# Patient Record
Sex: Male | Born: 1955 | Race: White | Hispanic: No | Marital: Married | Smoking: Never smoker
Health system: Southern US, Community
[De-identification: ages and names within clinical notes are randomized; demographics above are authoritative.]

## PROBLEM LIST (undated history)

## (undated) DIAGNOSIS — I10 Essential (primary) hypertension: Secondary | ICD-10-CM

## (undated) DIAGNOSIS — M1990 Unspecified osteoarthritis, unspecified site: Secondary | ICD-10-CM

## (undated) DIAGNOSIS — E119 Type 2 diabetes mellitus without complications: Secondary | ICD-10-CM

## (undated) DIAGNOSIS — R0600 Dyspnea, unspecified: Secondary | ICD-10-CM

## (undated) DIAGNOSIS — M542 Cervicalgia: Secondary | ICD-10-CM

## (undated) DIAGNOSIS — M549 Dorsalgia, unspecified: Secondary | ICD-10-CM

## (undated) DIAGNOSIS — M109 Gout, unspecified: Secondary | ICD-10-CM

## (undated) DIAGNOSIS — E7800 Pure hypercholesterolemia, unspecified: Secondary | ICD-10-CM

## (undated) DIAGNOSIS — M2550 Pain in unspecified joint: Secondary | ICD-10-CM

## (undated) DIAGNOSIS — R600 Localized edema: Secondary | ICD-10-CM

## (undated) DIAGNOSIS — M25559 Pain in unspecified hip: Secondary | ICD-10-CM

## (undated) DIAGNOSIS — K5900 Constipation, unspecified: Secondary | ICD-10-CM

## (undated) DIAGNOSIS — M25519 Pain in unspecified shoulder: Secondary | ICD-10-CM

## (undated) DIAGNOSIS — G4730 Sleep apnea, unspecified: Secondary | ICD-10-CM

## (undated) DIAGNOSIS — Z87442 Personal history of urinary calculi: Secondary | ICD-10-CM

## (undated) DIAGNOSIS — E559 Vitamin D deficiency, unspecified: Secondary | ICD-10-CM

## (undated) HISTORY — DX: Pain in unspecified joint: M25.50

## (undated) HISTORY — DX: Pure hypercholesterolemia, unspecified: E78.00

## (undated) HISTORY — DX: Pain in unspecified shoulder: M25.519

## (undated) HISTORY — DX: Constipation, unspecified: K59.00

## (undated) HISTORY — DX: Cervicalgia: M54.2

## (undated) HISTORY — DX: Dorsalgia, unspecified: M54.9

## (undated) HISTORY — DX: Essential (primary) hypertension: I10

## (undated) HISTORY — DX: Sleep apnea, unspecified: G47.30

## (undated) HISTORY — PX: BACK SURGERY: SHX140

## (undated) HISTORY — DX: Pain in unspecified hip: M25.559

## (undated) HISTORY — DX: Dyspnea, unspecified: R06.00

## (undated) HISTORY — DX: Localized edema: R60.0

## (undated) HISTORY — DX: Vitamin D deficiency, unspecified: E55.9

---
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
## 2009-08-13 ENCOUNTER — Ambulatory Visit: Payer: Self-pay | Admitting: Internal Medicine

## 2012-03-19 ENCOUNTER — Emergency Department (HOSPITAL_COMMUNITY)
Admission: EM | Admit: 2012-03-19 | Discharge: 2012-03-19 | Disposition: A | Discharge status: Home / Self Care | Payer: BC Managed Care – PPO | Attending: Emergency Medicine | Admitting: Emergency Medicine

## 2012-03-19 ENCOUNTER — Encounter (HOSPITAL_COMMUNITY): Payer: Self-pay

## 2012-03-19 ENCOUNTER — Emergency Department (HOSPITAL_COMMUNITY): Payer: BC Managed Care – PPO

## 2012-03-19 DIAGNOSIS — N201 Calculus of ureter: Secondary | ICD-10-CM | POA: Insufficient documentation

## 2012-03-19 DIAGNOSIS — N200 Calculus of kidney: Secondary | ICD-10-CM

## 2012-03-19 DIAGNOSIS — R109 Unspecified abdominal pain: Secondary | ICD-10-CM | POA: Insufficient documentation

## 2012-03-19 HISTORY — DX: Gout, unspecified: M10.9

## 2012-03-19 LAB — URINALYSIS, ROUTINE W REFLEX MICROSCOPIC
Bilirubin Urine: NEGATIVE
Glucose, UA: NEGATIVE mg/dL
Protein, ur: 30 mg/dL — AB

## 2012-03-19 LAB — URINE MICROSCOPIC-ADD ON

## 2012-03-19 MED ORDER — KETOROLAC TROMETHAMINE 30 MG/ML IJ SOLN
30.0000 mg | Freq: Once | INTRAMUSCULAR | Status: AC
  Administered 2012-03-19: 30 mg via INTRAVENOUS
  Filled 2012-03-19: qty 1

## 2012-03-19 MED ORDER — HYDROMORPHONE HCL PF 1 MG/ML IJ SOLN
1.0000 mg | Freq: Once | INTRAMUSCULAR | Status: DC
  Filled 2012-03-19: qty 1

## 2012-03-19 MED ORDER — TAMSULOSIN HCL 0.4 MG PO CAPS: 0.4000 mg | ORAL_CAPSULE | Freq: Every day | ORAL | Status: DC

## 2012-03-19 MED ORDER — OXYCODONE-ACETAMINOPHEN 5-325 MG PO TABS
1.0000 | ORAL_TABLET | Freq: Four times a day (QID) | ORAL | Status: AC | PRN
Start: 2012-03-19 — End: 2012-03-29

## 2012-03-19 MED ORDER — OXYCODONE-ACETAMINOPHEN 5-325 MG PO TABS: 1.0000 | ORAL_TABLET | Freq: Four times a day (QID) | ORAL | Status: DC | PRN

## 2012-03-19 MED ORDER — ONDANSETRON HCL 4 MG/2ML IJ SOLN
4.0000 mg | Freq: Once | INTRAMUSCULAR | Status: AC
  Administered 2012-03-19: 4 mg via INTRAVENOUS
  Filled 2012-03-19: qty 2

## 2012-03-19 NOTE — ED Provider Notes (Signed)
History     CSN: [id number]  Arrival date & time 03/19/12  1847   First MD Initiated Contact with Patient 03/19/12 2036      Chief Complaint  Patient presents with  . Flank Pain    (Consider location/radiation/quality/duration/timing/severity/associated sxs/prior treatment) Patient is a 56 y.o. male presenting with flank pain. The history is provided by the patient.  Flank Pain This is a new problem. Episode onset: this morning. The problem occurs constantly. The problem has been rapidly worsening. Associated symptoms include abdominal pain. The symptoms are aggravated by nothing. The symptoms are relieved by nothing. He has tried nothing for the symptoms.    Past Medical History  Diagnosis Date  . Gout     No past surgical history on file.  No family history on file.  History  Substance Use Topics  . Smoking status: Never Smoker   . Smokeless tobacco: Not on file  . Alcohol Use: No      Review of Systems  Gastrointestinal: Positive for abdominal pain.  Genitourinary: Positive for flank pain.  All other systems reviewed and are negative.    Allergies  Review of patient's allergies indicates no known allergies.  Home Medications   Current Outpatient Rx  Name Route Sig Dispense Refill  . ALLOPURINOL 300 MG PO TABS Oral Take 300 mg by mouth daily.    [name] CIPROFLOXACIN HCL 250 MG PO TABS Oral Take 250 mg by mouth 2 (two) times daily.    [name] DICLOFENAC POTASSIUM 50 MG PO TABS Oral Take 50 mg by mouth 2 (two) times daily as needed. For pain    . ADULT MULTIVITAMIN W/MINERALS CH Oral Take 1 tablet by mouth daily.    [name] PRAVASTATIN SODIUM 40 MG PO TABS Oral Take 40 mg by mouth daily.    . TRAMADOL HCL 50 MG PO TABS Oral Take 50-100 mg by mouth daily as needed. For pain      BP 143/82  Pulse 75  Temp 98.8 F (37.1 C)  Resp 18  SpO2 98%  Physical Exam  Nursing note and vitals reviewed. Constitutional: He is oriented to person, place, and time. He appears  well-developed and well-nourished. No distress.  HENT:  Head: Normocephalic and atraumatic.  Neck: Normal range of motion. Neck supple.  Cardiovascular: Normal rate and regular rhythm.   Pulmonary/Chest: Effort normal and breath sounds normal. No respiratory distress. He has no wheezes.  Abdominal: Soft. Bowel sounds are normal. He exhibits no distension. There is no tenderness.  Musculoskeletal: Normal range of motion. He exhibits no edema.  Neurological: He is alert and oriented to person, place, and time.  Skin: Skin is warm and dry. He is not diaphoretic.    ED Course  Procedures (including critical care time)  Labs Reviewed  URINALYSIS, ROUTINE W REFLEX MICROSCOPIC - Abnormal; Notable for the following:    APPearance CLOUDY (*)    Hgb urine dipstick LARGE (*)    Protein, ur 30 (*)    Leukocytes, UA MODERATE (*)    All other components within normal limits  URINE MICROSCOPIC-ADD ON   Ct Abdomen Pelvis Wo Contrast  03/19/2012  *RADIOLOGY REPORT*  Clinical Data: Left flank pain and hematuria.  CT ABDOMEN AND PELVIS WITHOUT CONTRAST  Technique:  Multidetector CT imaging of the abdomen and pelvis was performed following the standard protocol without intravenous contrast.  Comparison: None.  Findings: 9 mm calculus in the left ureteropelvic junction with mild dilatation of the left renal collecting system, mild  enlargement of the left kidney and mild left perinephric soft tissue stranding.  There is also a 3 mm calculus in the lower pole of the left kidney.  A 12 mm calculus is demonstrated in the lower pole of the right kidney with two adjacent smaller calculi.  Normal appearing appendix.  No gastrointestinal abnormalities or enlarged lymph nodes.  Normal sized prostate gland with a small amount of calcification.  No bladder or ureteral calculi.  Unremarkable noncontrasted appearance of the liver, spleen, pancreas, gallbladder and adrenal glands.  Minimal atelectasis or scarring at both lung  bases.  Lumbar and lower thoracic spine degenerative changes, including changes of DISH.  IMPRESSION:  1.  9 mm left UPJ calculus causing mild left hydronephrosis. 2.  Bilateral nonobstructing renal calculi.  Original Report Authenticated By: Darrol Angel, M.D.     No diagnosis found.    MDM  The patient presents with the sudden onset of flank pain and the ct shows a 9 mm stone in the upj.  He feels much better with dilaudid and toradol.  He will discharged with percocet and flomax.        Geoffery Lyons, MD 03/19/12 2308

## 2012-03-19 NOTE — ED Notes (Signed)
Complains of blood in urine x 1 week ago, seen md for same today started habing left flank pain.

## 2012-03-19 NOTE — Discharge Instructions (Signed)

## 2012-03-28 HISTORY — PX: KIDNEY STONE SURGERY: SHX686

## 2013-02-08 ENCOUNTER — Encounter: Payer: Self-pay | Admitting: Diagnostic Neuroimaging

## 2013-02-08 ENCOUNTER — Ambulatory Visit (INDEPENDENT_AMBULATORY_CARE_PROVIDER_SITE_OTHER): Payer: BC Managed Care – PPO | Admitting: Diagnostic Neuroimaging

## 2013-02-08 VITALS — BP 156/98 | HR 84 | Ht 74.0 in | Wt 367.0 lb

## 2013-02-08 DIAGNOSIS — R259 Unspecified abnormal involuntary movements: Secondary | ICD-10-CM

## 2013-02-08 DIAGNOSIS — R5383 Other fatigue: Secondary | ICD-10-CM

## 2013-02-08 DIAGNOSIS — R5381 Other malaise: Secondary | ICD-10-CM

## 2013-02-08 DIAGNOSIS — R531 Weakness: Secondary | ICD-10-CM

## 2013-02-08 DIAGNOSIS — R200 Anesthesia of skin: Secondary | ICD-10-CM

## 2013-02-08 DIAGNOSIS — R253 Fasciculation: Secondary | ICD-10-CM

## 2013-02-08 DIAGNOSIS — R209 Unspecified disturbances of skin sensation: Secondary | ICD-10-CM

## 2013-02-08 NOTE — Progress Notes (Signed)
[facility]  PATIENT: Luke Larsen DOB: 10-09-1956  REFERRING CLINICIAN: Tisovec, Richard HISTORY FROM: patient REASON FOR VISIT: Neurologic Consult   HISTORICAL  CHIEF COMPLAINT:  Chief Complaint  Patient presents with  . Fatigue    HISTORY OF PRESENT ILLNESS: Luke Larsen is a pleasant, obese gentleman who reports increased lethargy and fatigue since January 21, 2012 when daughter that lives with them had baby.  Since that time reports only approximately 3 days of feeling "good".  Reports intermittent muscle twitching all over and tingling in left arm.  Patient is left-handed.  Reports more frequent headaches. Reports increased anxiety over symptoms and disruption of regular sleep pattern.  Requested referral to PCP to investigate symptoms.  Patient has been on pravastatin in the past, stopped in November 2013. This was restarted 01/16/2013. Within a few days onset of symptoms occurred. However he has stopped pravastatin since that time but his symptoms have persisted.  Patient reports remote history of sleep apnea diagnosed by sleep study. However he did not followup for further testing or treatment.  REVIEW OF SYSTEMS: Full 14 system review of systems performed and notable only for weight loss fatigue and feeling hot and cold numbness weakness tremor decreased energy change in appetite disinterest in activities.  ALLERGIES: No Known Allergies  HOME MEDICATIONS: Outpatient Prescriptions Prior to Visit  Medication Sig Dispense Refill  . allopurinol (ZYLOPRIM) 300 MG tablet Take 300 mg by mouth daily.      . ciprofloxacin (CIPRO) 250 MG tablet Take 250 mg by mouth 2 (two) times daily.      . diclofenac (CATAFLAM) 50 MG tablet Take 50 mg by mouth 2 (two) times daily as needed. For pain      . Multiple Vitamin (MULITIVITAMIN WITH MINERALS) TABS Take 1 tablet by mouth daily.      . pravastatin (PRAVACHOL) 40 MG tablet Take 40 mg by mouth daily.      . Tamsulosin  HCl (FLOMAX) 0.4 MG CAPS Take 1 capsule (0.4 mg total) by mouth daily.  10 capsule  0  . traMADol (ULTRAM) 50 MG tablet Take 50-100 mg by mouth daily as needed. For pain       No facility-administered medications prior to visit.    PAST MEDICAL HISTORY: Past Medical History  Diagnosis Date  . Gout   . Hypercholesterolemia     PAST SURGICAL HISTORY: Past Surgical History  Procedure Laterality Date  . Kidney stone surgery  03/28/2012    FAMILY HISTORY: History reviewed. No pertinent family history.  SOCIAL HISTORY:  History   Social History  . Marital Status: Married    Spouse Name: N/A    Number of Children: 2  . 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 Smoking status: Never Smoker   . Smokeless tobacco: Not on file  . Alcohol Use: No     Comment: Quit drinking alcohol 23yrs ago  . Drug Use: No  . Sexually Active: Not on file   Other Topics Concern  . Not on file   Social History Narrative   Pt lives at home with his spouse and daughter.   Caffeine Use- Quit 01/04/13     PHYSICAL EXAM  Filed Vitals:   02/08/13 0855  BP: 156/98  Pulse: 84  Height: 6\' 2"  (1.88 m)  Weight: 367 lb (166.47 kg)   Body mass index is 47.1 kg/(m^2).  GENERAL EXAM: Patient is in  no distress  CARDIOVASCULAR: Regular rate and rhythm, no murmurs, no carotid bruits  NEUROLOGIC: MENTAL STATUS: awake, alert, language fluent, comprehension intact, naming intact CRANIAL NERVE: no papilledema on fundoscopic exam, pupils equal and reactive to light, visual fields full to confrontation, extraocular muscles intact, no nystagmus, facial sensation and strength symmetric, uvula midline, shoulder shrug symmetric, tongue midline with no fasciculations present. MOTOR: normal bulk and tone, full strength in the BUE, BLE; NO FASCICULATIONS. SENSORY: normal and symmetric to light touch, pinprick, temperature, vibration and  proprioception COORDINATION: finger-nose-finger, fine finger movements normal REFLEXES: deep tendon reflexes present; DECR RIGHT KNEE REFLEX. MUTE TOES. GAIT/STATION: narrow based gait; able to walk on toes, heels and tandem; romberg is negative   DIAGNOSTIC DATA (LABS, IMAGING, TESTING) - I reviewed patient records, labs, notes, testing and imaging myself where available.  Hep A Ab, IgM, Hep A Ab Total and Ehrlichia Ab Panel all negative.  CK Total 124 CMP,  WNL except ALT elevated at 67. WBC slightly elevated; 11.1 Blood counts elevated at Hgb 18.6, Hct 56.2, MCV 97.4, MCH 32.2    ASSESSMENT AND PLAN  57 y.o. year old male  has a past medical history of Gout and Hypercholesterolemia. here with increased fatigue, lethargy and headaches, possible sleep apnea.  DDX: CNS inflamm, vascular, metabolic, neuromuscular, sleep apnea  Will check MR Brain Wo Contrast, if negative EMG studies and sleep apnea workup. Check CK, Vitamin B12 and HgbA1C.   Orders Placed This Encounter  Procedures  . MR Brain Wo Contrast  . CK  . Vitamin B12  . Hemoglobin A1c     LYNN LAM NP-C 02/08/2013, 10:01 AM   I reviewed note, examined patient and agree with plan. Additional testing.  Suanne Marker, MD 02/08/2013, 5:07 PM Certified in Neurology, Neurophysiology and Neuroimaging  [facility] [address], [address] [place], [place] [address] [phone]

## 2013-02-08 NOTE — Patient Instructions (Signed)
Will order addl testing.

## 2013-02-09 LAB — HEMOGLOBIN A1C: Est. average glucose Bld gHb Est-mCnc: 126 mg/dL

## 2013-02-09 LAB — CK: Total CK: 104 U/L (ref 24–204)

## 2013-02-09 LAB — VITAMIN B12: Vitamin B-12: 525 pg/mL (ref 211–946)

## 2013-02-11 ENCOUNTER — Ambulatory Visit
Admission: RE | Admit: 2013-02-11 | Discharge: 2013-02-11 | Disposition: A | Discharge status: Home / Self Care | Payer: BC Managed Care – PPO | Source: Ambulatory Visit | Attending: Diagnostic Neuroimaging | Admitting: Diagnostic Neuroimaging

## 2013-02-11 DIAGNOSIS — R253 Fasciculation: Secondary | ICD-10-CM

## 2013-02-11 DIAGNOSIS — R531 Weakness: Secondary | ICD-10-CM

## 2013-02-11 DIAGNOSIS — R200 Anesthesia of skin: Secondary | ICD-10-CM

## 2013-02-11 DIAGNOSIS — R51 Headache: Secondary | ICD-10-CM

## 2013-02-13 ENCOUNTER — Other Ambulatory Visit: Payer: BC Managed Care – PPO

## 2013-02-17 ENCOUNTER — Telehealth: Payer: Self-pay | Admitting: *Deleted

## 2013-02-17 DIAGNOSIS — R531 Weakness: Secondary | ICD-10-CM

## 2013-02-17 DIAGNOSIS — R5381 Other malaise: Secondary | ICD-10-CM

## 2013-02-17 NOTE — Telephone Encounter (Signed)
Patient calling because he had MRI on Saturday and want his results. Patient been calling all week and no one has called him back.

## 2013-02-17 NOTE — Telephone Encounter (Signed)
Called pt, gave normal MRI results. Would like to go forward with additional testing (EMG).

## 2013-02-17 NOTE — Telephone Encounter (Signed)
I ordered EMG. -VRP

## 2013-02-18 ENCOUNTER — Emergency Department (HOSPITAL_COMMUNITY)
Admission: EM | Admit: 2013-02-18 | Discharge: 2013-02-18 | Disposition: A | Discharge status: Home / Self Care | Payer: BC Managed Care – PPO | Attending: Emergency Medicine | Admitting: Emergency Medicine

## 2013-02-18 ENCOUNTER — Encounter (HOSPITAL_COMMUNITY): Payer: Self-pay | Admitting: *Deleted

## 2013-02-18 DIAGNOSIS — Z8639 Personal history of other endocrine, nutritional and metabolic disease: Secondary | ICD-10-CM | POA: Insufficient documentation

## 2013-02-18 DIAGNOSIS — R251 Tremor, unspecified: Secondary | ICD-10-CM

## 2013-02-18 DIAGNOSIS — E669 Obesity, unspecified: Secondary | ICD-10-CM | POA: Insufficient documentation

## 2013-02-18 DIAGNOSIS — E7800 Pure hypercholesterolemia, unspecified: Secondary | ICD-10-CM | POA: Insufficient documentation

## 2013-02-18 DIAGNOSIS — Z862 Personal history of diseases of the blood and blood-forming organs and certain disorders involving the immune mechanism: Secondary | ICD-10-CM | POA: Insufficient documentation

## 2013-02-18 DIAGNOSIS — Z79899 Other long term (current) drug therapy: Secondary | ICD-10-CM | POA: Insufficient documentation

## 2013-02-18 DIAGNOSIS — R5381 Other malaise: Secondary | ICD-10-CM | POA: Insufficient documentation

## 2013-02-18 DIAGNOSIS — R5383 Other fatigue: Secondary | ICD-10-CM | POA: Insufficient documentation

## 2013-02-18 DIAGNOSIS — R531 Weakness: Secondary | ICD-10-CM

## 2013-02-18 LAB — URINALYSIS, ROUTINE W REFLEX MICROSCOPIC
Glucose, UA: NEGATIVE mg/dL
Specific Gravity, Urine: 1.014 (ref 1.005–1.030)
pH: 6.5 (ref 5.0–8.0)

## 2013-02-18 LAB — BASIC METABOLIC PANEL
CO2: 24 mEq/L (ref 19–32)
Chloride: 102 mEq/L (ref 96–112)
Sodium: 139 mEq/L (ref 135–145)

## 2013-02-18 LAB — CBC WITH DIFFERENTIAL/PLATELET
Basophils Absolute: 0 10*3/uL (ref 0.0–0.1)
HCT: 45.9 % (ref 39.0–52.0)
Lymphocytes Relative: 23 % (ref 12–46)
Lymphs Abs: 1.8 10*3/uL (ref 0.7–4.0)
Neutro Abs: 5.3 10*3/uL (ref 1.7–7.7)
Platelets: 244 10*3/uL (ref 150–400)
RBC: 5.23 MIL/uL (ref 4.22–5.81)
RDW: 12.5 % (ref 11.5–15.5)
WBC: 7.9 10*3/uL (ref 4.0–10.5)

## 2013-02-18 LAB — CK: Total CK: 113 U/L (ref 7–232)

## 2013-02-18 LAB — URINE MICROSCOPIC-ADD ON

## 2013-02-18 NOTE — ED Notes (Signed)
Went to be ~ 0300 and some nervousness, minor shaking.  Got up to bathroom, and muscle twitching, tremors all over. Mostly legs and arms. Onset 4/5 and 01/22/2013

## 2013-02-18 NOTE — ED Provider Notes (Signed)
History    CSN: [id number] Arrival date & time 02/18/13  0701 First MD Initiated Contact with Patient 02/18/13 [phone]      Chief Complaint  Patient presents with  . Tremors  . Shaking    HPI The patient presents to the emergency room with complaints of episodic persistent muscle tremors, myalgias and weakness. The symptoms started about one month ago. He needed seeing his primary Dr. who did some basic lab tests and ended up referring him to neurology. The neurologist performed an MRI of the brain that was reportedly normal. Patient has not been for a followup evaluation since the MRI. Initially his providers thought it could be related to Pravachol that he had recently restarted. He discontinued that medication but the symptoms have not changed at all and that has been at least a few weeks since stopping the medication. Last night he another episode about 300 where he felt anxious but also had some muscle tremors. His muscle tremors were mostly in his legs and arms but at times he notices it in his face. He denies any focal weakness. Is not having trouble with his coordination or balance specifically. No vomiting, fevers, chest pain or shortness of breath.  Past Medical History  Diagnosis Date  . Gout   . Hypercholesterolemia     Past Surgical History  Procedure Laterality Date  . Kidney stone surgery  03/28/2012    No family history on file.  History  Substance Use Topics  . Smoking status: Never Smoker   . Smokeless tobacco: Not on file  . Alcohol Use: No     Comment: Quit drinking alcohol 28yrs ago      Review of Systems  All other systems reviewed and are negative.    Allergies  Review of patient's allergies indicates no known allergies.  Home Medications   Current Outpatient Rx  Name  Route  Sig  Dispense  Refill  . clonazePAM (KLONOPIN) 0.5 MG tablet   Oral   Take 0.5 mg by mouth every other day.          . ibuprofen (ADVIL,MOTRIN) 200 MG tablet   Oral  Take 200 mg by mouth daily as needed for pain.         . Multiple Vitamin (MULTIVITAMIN WITH MINERALS) TABS   Oral   Take 1 tablet by mouth daily.           BP 144/80  Pulse 77  Temp(Src) 97.8 F (36.6 C) (Oral)  Resp 18  SpO2 95%  Physical Exam  Nursing note and vitals reviewed. Constitutional: He appears well-developed and well-nourished. No distress.  Obese  HENT:  Head: Normocephalic and atraumatic.  Right Ear: External ear normal.  Left Ear: External ear normal.  Eyes: Conjunctivae are normal. Right eye exhibits no discharge. Left eye exhibits no discharge. No scleral icterus.  Neck: Neck supple. No tracheal deviation present.  Cardiovascular: Normal rate, regular rhythm and intact distal pulses.   Pulmonary/Chest: Effort normal and breath sounds normal. No stridor. No respiratory distress. He has no wheezes. He has no rales.  Abdominal: Soft. Bowel sounds are normal. He exhibits no distension. There is no tenderness. There is no rebound and no guarding.  Musculoskeletal: He exhibits no edema and no tenderness.  Neurological: He is alert. He has normal strength. He displays no atrophy and no tremor. No cranial nerve deficit ( no gross defecits noted) or sensory deficit. He exhibits normal muscle tone. He displays no seizure activity. Coordination normal.  5 out of 5 strength bilateral upper extremities and lower extremities  Skin: Skin is warm and dry. No rash noted.  Psychiatric: He has a normal mood and affect.    ED Course  Procedures (including critical care time)  Rate: 73  Rhythm: normal sinus rhythm  QRS Axis: normal  Intervals: normal  ST/T Wave abnormalities: normal  Conduction Disutrbances:none  Narrative Interpretation: early precordial rs transition  Old EKG Reviewed: none available  Labs Reviewed  CBC WITH DIFFERENTIAL - Abnormal; Notable for the following:    MCHC 37.0 (*)    All other components within normal limits  URINALYSIS, ROUTINE W  REFLEX MICROSCOPIC - Abnormal; Notable for the following:    Hgb urine dipstick TRACE (*)    Leukocytes, UA TRACE (*)    All other components within normal limits  URINE MICROSCOPIC-ADD ON - Abnormal; Notable for the following:    Squamous Epithelial / LPF FEW (*)    All other components within normal limits  BASIC METABOLIC PANEL  CK   No results found.   1. Weakness   2. Tremors of nervous system       MDM  Records reviewed from his visit with [facility]. The plan was for additional testing including EMG.  There is no acute abnormality noted here in the emergency room. There are several possibilities causing the patient's symptoms such as conditions like ALS.  At this time he has good strength and no focal weakness. Patient does have outpatient followup plan with the neurologist.  At this time there does not appear to be any evidence of an acute emergency medical condition and the patient appears stable for discharge with appropriate outpatient follow up. I discussed the importance of continuing this evaluation with his neurologist and primary Dr.           Celene Kras, MD 02/18/13 [phone]

## 2013-03-16 ENCOUNTER — Other Ambulatory Visit: Payer: Self-pay | Admitting: Family Medicine

## 2013-03-16 ENCOUNTER — Ambulatory Visit
Admission: RE | Admit: 2013-03-16 | Discharge: 2013-03-16 | Disposition: A | Discharge status: Home / Self Care | Payer: BC Managed Care – PPO | Source: Ambulatory Visit | Attending: Family Medicine | Admitting: Family Medicine

## 2013-03-16 DIAGNOSIS — R634 Abnormal weight loss: Secondary | ICD-10-CM

## 2013-03-21 ENCOUNTER — Encounter: Payer: Self-pay | Admitting: Diagnostic Neuroimaging

## 2013-03-21 ENCOUNTER — Ambulatory Visit (INDEPENDENT_AMBULATORY_CARE_PROVIDER_SITE_OTHER): Payer: BC Managed Care – PPO | Admitting: Diagnostic Neuroimaging

## 2013-03-21 VITALS — BP 146/86 | HR 69 | Temp 98.0°F | Ht 74.0 in | Wt 346.0 lb

## 2013-03-21 DIAGNOSIS — R5383 Other fatigue: Secondary | ICD-10-CM

## 2013-03-21 DIAGNOSIS — R200 Anesthesia of skin: Secondary | ICD-10-CM | POA: Insufficient documentation

## 2013-03-21 DIAGNOSIS — R5381 Other malaise: Secondary | ICD-10-CM | POA: Insufficient documentation

## 2013-03-21 DIAGNOSIS — R531 Weakness: Secondary | ICD-10-CM | POA: Insufficient documentation

## 2013-03-21 DIAGNOSIS — R209 Unspecified disturbances of skin sensation: Secondary | ICD-10-CM

## 2013-03-21 NOTE — Patient Instructions (Signed)
I will check EMG test.

## 2013-03-21 NOTE — Progress Notes (Signed)
[facility]  PATIENT: Luke Larsen DOB: [birth year]  REFERRING CLINICIAN:  HISTORY FROM: patient REASON FOR VISIT: follow up   HISTORICAL  CHIEF COMPLAINT:  No chief complaint on file.   HISTORY OF PRESENT ILLNESS:   UPDATE 03/21/13: Since last visit patient continues to do poorly. He went to ER on 02/18/13 for "tremors" which sound anxiety related. He continues to have nonspecific malaise, fatigue, apathy, insomnia, pain, numbness. He is on sertraline and clonazepam without significant benefit. Yesterday he had a very good day, but today he is feeling poorly. He has a hard time describing his symptoms. He doesn't feel focal areas of pain per say, but rather a generalized malaise. Patient is establish with a new primary care physician, Dr. Cliffton Asters. He feels that something is wrong inside of his body and that "something attacked" him in April 2014. He is concerned about possibilities of parasites, ALS, Parkinson's disease or multiple sclerosis.  PRIOR HPI: 57 year old male who reports increased lethargy and fatigue since January 21, 2012 when daughter that lives with them had baby.  Since that time reports only approximately 3 days of feeling "good".  Reports intermittent muscle twitching all over and tingling in left arm.  Patient is left-handed.  Reports more frequent headaches. Reports increased anxiety over symptoms and disruption of regular sleep pattern.  Requested referral to PCP to investigate symptoms.  Patient has been on pravastatin in the past, stopped in November 2013. This was restarted 01/16/2013. Within a few days onset of symptoms occurred. However he has stopped pravastatin since that time but his symptoms have persisted.  Patient reports remote history of sleep apnea diagnosed by sleep study. However he did not followup for further testing or treatment.  REVIEW OF SYSTEMS: Full 14 system review of systems performed and notable only for racing thoughts decreased  energy change in appetite headache weakness flushing joint pain aching muscles running no skin sensitivity diarrhea weight loss fatigue.  ALLERGIES: No Known Allergies  HOME MEDICATIONS: Outpatient Encounter Prescriptions as of 03/21/2013  Medication Sig Dispense Refill  . allopurinol (ZYLOPRIM) 300 MG tablet Take 1 tablet by mouth daily.      . clonazePAM (KLONOPIN) 0.5 MG tablet Take 0.5 mg by mouth every other day.       . ibuprofen (ADVIL,MOTRIN) 200 MG tablet Take 200 mg by mouth daily as needed for pain.      . Multiple Vitamin (MULTIVITAMIN WITH MINERALS) TABS Take 1 tablet by mouth daily.      . pantoprazole (PROTONIX) 40 MG tablet Take 1 tablet by mouth daily.      . sertraline (ZOLOFT) 50 MG tablet Take 1 tablet by mouth daily.      . Vitamin D, Ergocalciferol, (DRISDOL) 50000 UNITS CAPS Take 1 capsule by mouth once a week.       No facility-administered encounter medications on file as of 03/21/2013.    PAST MEDICAL HISTORY: Past Medical History  Diagnosis Date  . Gout   . Hypercholesterolemia     PAST SURGICAL HISTORY: Past Surgical History  Procedure Laterality Date  . Kidney stone surgery  03/28/2012    FAMILY HISTORY: History reviewed. No pertinent family history.  SOCIAL HISTORY:  History   Social History  . Marital Status: Married    Spouse Name: Aurther Loft    Number of Children: 2  . 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 Smoking status:  Never Smoker   . Smokeless tobacco: Never Used  . Alcohol Use: No     Comment: Quit drinking alcohol 32yrs ago;very little  . Drug Use: No  . Sexually Active: Not on file   Other Topics Concern  . Not on file   Social History Narrative   Pt lives at home with his spouse and daughter.   Caffeine Use- Quit 01/04/13     PHYSICAL EXAM  Filed Vitals:   03/21/13 1328  BP: 146/86  Pulse: 69  Temp: 98 F (36.7 C)  TempSrc: Oral  Height: 6\' 2"  (1.88 m)   Weight: 346 lb (156.945 kg)   Body mass index is 44.4 kg/(m^2).  GENERAL EXAM: Patient is in no distress  CARDIOVASCULAR: Regular rate and rhythm, no murmurs, no carotid bruits  NEUROLOGIC: MENTAL STATUS: awake, alert, language fluent, comprehension intact, naming intact CRANIAL NERVE: no papilledema on fundoscopic exam, pupils equal and reactive to light, visual fields full to confrontation, extraocular muscles intact, no nystagmus, facial sensation and strength symmetric, uvula midline, shoulder shrug symmetric, tongue midline with no fasciculations present. MOTOR: normal bulk and tone, full strength in the BUE, BLE; NO FASCICULATIONS. SENSORY: normal and symmetric to light touch COORDINATION: finger-nose-finger, fine finger movements normal REFLEXES: deep tendon reflexes present; DECR RIGHT KNEE REFLEX. GAIT/STATION: narrow based gait; able to walk on toes, heels and tandem; romberg is negative   DIAGNOSTIC DATA (LABS, IMAGING, TESTING) - I reviewed patient records, labs, notes, testing and imaging myself where available.  Hep A Ab, IgM, Hep A Ab Total and Ehrlichia Ab Panel all negative.  CK, total - 124 CMP - normal except ALT elevated at 67. WBC slightly elevated; 11.1 Blood counts elevated at Hgb 18.6, Hct 56.2, MCV 97.4, MCH 32.2  02/08/13 Labs: B12 525 CK 104 A1c 6.0   ASSESSMENT AND PLAN  57 y.o. year old male  has a past medical history of Gout and Hypercholesterolemia. here with increased fatigue, lethargy and headaches, possible sleep apnea. No clear primary neurologic cause. Could be systemic, rheumatologic, metabolic or primary mood disturbance.  PLAN: 1. EMG/NCS to eval for radiculopathy, neuropathy or myopathy 2. Follow up with PCP or psychiatry for mood treatment (currently on sertraline and clonazepam)   Suanne Marker, MD 03/21/2013, 2:16 PM Certified in Neurology, Neurophysiology and Neuroimaging  [facility] [address]  [address] [place], [place] [address] [phone]

## 2013-03-29 ENCOUNTER — Ambulatory Visit (INDEPENDENT_AMBULATORY_CARE_PROVIDER_SITE_OTHER): Payer: BC Managed Care – PPO | Admitting: Diagnostic Neuroimaging

## 2013-03-29 ENCOUNTER — Encounter: Payer: Self-pay | Admitting: Diagnostic Neuroimaging

## 2013-03-29 ENCOUNTER — Encounter (INDEPENDENT_AMBULATORY_CARE_PROVIDER_SITE_OTHER): Payer: BC Managed Care – PPO | Admitting: Radiology

## 2013-03-29 DIAGNOSIS — R5383 Other fatigue: Secondary | ICD-10-CM

## 2013-03-29 DIAGNOSIS — R531 Weakness: Secondary | ICD-10-CM

## 2013-03-29 DIAGNOSIS — R5381 Other malaise: Secondary | ICD-10-CM

## 2013-03-29 DIAGNOSIS — Z0289 Encounter for other administrative examinations: Secondary | ICD-10-CM

## 2013-03-29 DIAGNOSIS — R209 Unspecified disturbances of skin sensation: Secondary | ICD-10-CM

## 2013-03-29 NOTE — Procedures (Addendum)
   [facility]  NCS (NERVE CONDUCTION STUDY) WITH EMG (ELECTROMYOGRAPHY) REPORT   STUDY DATE: 03/29/13 PATIENT NAME: Luke Larsen DOB: July 20, 1956 MRN: [id number]  ORDERING CLINICIAN: Joycelyn Schmid, MD   TECHNOLOGIST: Kaylyn Lim ELECTROMYOGRAPHER: Glenford Bayley. Joshiah Traynham, MD  CLINICAL INFORMATION: 57 year old male with lower extremity weakness, malaise and fatigue.  FINDINGS: NERVE CONDUCTION STUDY: Bilateral peroneal and left tibial motor responses have normal distal latencies, amplitudes, conduction velocities and F-wave latencies. Right tibial motor responses normal distal latency, decreased amplitude, normal conduction velocity and prolonged F-wave latency. Bilateral H reflex responses with dementia the tibial nerves and recording over the soleus muscles are prolonged (right 42 ms, left 41 ms, normal less than or equal to 31 ms). Bilateral sural and left peroneal sensory responses are normal. Right peroneal sensory response has normal amplitude and borderline slow conduction velocity.  NEEDLE ELECTROMYOGRAPHY: Needle examination of selected muscles of the right lower extremity is normal. Right vastus medialis, tibialis anterior, gastrocnemius and right L4-5 and L5-S1 paraspinal muscles are normal.  IMPRESSION:  Abnormal study demonstrating electrodiagnostic evidence of bilateral lumbosacral radiculopathies (right greater than left). No evidence of diffuse underlying neuropathy or myopathy at this time.   INTERPRETING PHYSICIAN:  Suanne Marker, MD Certified in Neurology, Neurophysiology and Neuroimaging  [facility] [address] [place], [place] [address] [phone]

## 2013-03-31 ENCOUNTER — Other Ambulatory Visit: Payer: Self-pay | Admitting: Family Medicine

## 2013-03-31 ENCOUNTER — Ambulatory Visit
Admission: RE | Admit: 2013-03-31 | Discharge: 2013-03-31 | Disposition: A | Discharge status: Home / Self Care | Payer: BC Managed Care – PPO | Source: Ambulatory Visit | Attending: Family Medicine | Admitting: Family Medicine

## 2013-03-31 ENCOUNTER — Telehealth: Payer: Self-pay | Admitting: Diagnostic Neuroimaging

## 2013-03-31 ENCOUNTER — Other Ambulatory Visit: Payer: Self-pay | Admitting: Orthopedic Surgery

## 2013-03-31 DIAGNOSIS — R1013 Epigastric pain: Secondary | ICD-10-CM

## 2013-03-31 MED ORDER — IOHEXOL 300 MG/ML  SOLN
125.0000 mL | Freq: Once | INTRAMUSCULAR | Status: AC | PRN
  Administered 2013-03-31: 125 mL via INTRAVENOUS

## 2013-03-31 NOTE — Telephone Encounter (Signed)
error 

## 2013-04-07 ENCOUNTER — Encounter: Payer: Self-pay | Admitting: Diagnostic Neuroimaging

## 2013-04-19 ENCOUNTER — Ambulatory Visit (INDEPENDENT_AMBULATORY_CARE_PROVIDER_SITE_OTHER): Payer: BC Managed Care – PPO | Admitting: Surgery

## 2013-04-19 ENCOUNTER — Encounter (INDEPENDENT_AMBULATORY_CARE_PROVIDER_SITE_OTHER): Payer: Self-pay | Admitting: Surgery

## 2013-04-19 VITALS — BP 114/78 | HR 76 | Temp 97.7°F | Resp 18 | Ht 75.0 in | Wt 335.6 lb

## 2013-04-19 DIAGNOSIS — Z860100 Personal history of colon polyps, unspecified: Secondary | ICD-10-CM | POA: Insufficient documentation

## 2013-04-19 DIAGNOSIS — D176 Benign lipomatous neoplasm of spermatic cord: Secondary | ICD-10-CM

## 2013-04-19 DIAGNOSIS — E66813 Obesity, class 3: Secondary | ICD-10-CM

## 2013-04-19 DIAGNOSIS — N200 Calculus of kidney: Secondary | ICD-10-CM | POA: Insufficient documentation

## 2013-04-19 DIAGNOSIS — K253 Acute gastric ulcer without hemorrhage or perforation: Secondary | ICD-10-CM

## 2013-04-19 DIAGNOSIS — F411 Generalized anxiety disorder: Secondary | ICD-10-CM

## 2013-04-19 DIAGNOSIS — K259 Gastric ulcer, unspecified as acute or chronic, without hemorrhage or perforation: Secondary | ICD-10-CM | POA: Insufficient documentation

## 2013-04-19 DIAGNOSIS — R5383 Other fatigue: Secondary | ICD-10-CM

## 2013-04-19 DIAGNOSIS — R5381 Other malaise: Secondary | ICD-10-CM

## 2013-04-19 NOTE — Progress Notes (Signed)
Subjective:     Patient ID: Luke Larsen, male   DOB: 04/21/1956, 57 y.o.   MRN: [id number]  HPI  Luke Larsen  [birth year] [id number]  Patient Care Team: Gaspar Garbe, MD as PCP - General (Internal Medicine) Suanne Marker, MD as Consulting Physician (Neurology) Konrad Penta as Consulting Physician (Gastroenterology) M. Shelby Dubin as Consulting Physician (Urology)  This patient is a 57 y.o.male who presents today for surgical evaluation at the request of Dr. Cliffton Asters.   Reason for visit: Possible inguinal hernias  Patient with many health issues.  Abdominal pain with EGD/colonoscopy & CT scan.  Found to have ulcer - started on PPI which seems to have helped.  One adenomatous colon polyp - 5 year f/u recommended.  INcisental kidney stones - no Sx.  Switched diet to low fat & no carbs - intentially lost 50 lbs Fatigue/soreness/myalgias/arthralgias starting in late March.  Has seen many specialists for this.  Found to have possible small inguinal hernias on CT scan.  Sent to me for evaluation.   No exertional chest/neck/shoulder/arm pain.  Patient can walk 20 minutes for about 1/2 miles without difficulty.   No personal nor family history of GI/colon cancer, inflammatory bowel disease, irritable bowel syndrome, allergy such as Celiac Sprue, dietary/dairy problems, colitis, ulcers nor gastritis.  No recent sick contacts/gastroenteritis.  No travel outside the country.  No changes in diet.  No falls or trauma.     Patient Active Problem List   Diagnosis Date Noted  . Anxiety state, unspecified 04/19/2013  . Obesity, Class III, BMI 40-49.9 (morbid obesity) 04/19/2013  . Gastric ulcer by EGD 04/19/2013  . Personal history of colonic polyps 04/19/2013  . 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 Nephrolithiasis - bilateral nonobstructive 04/19/2013  . Other malaise and fatigue 03/21/2013  . Weakness 03/21/2013  . Numbness 03/21/2013    Past Medical History  Diagnosis Date  .  Gout   . Hypercholesterolemia     Past Surgical History  Procedure Laterality Date  . Kidney stone surgery  03/28/2012    History   Social History  . Marital Status: Married    Spouse Name: Aurther Loft    Number of Children: 2  . 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 Smoking status: Never Smoker   . Smokeless tobacco: Never Used  . Alcohol Use: No     Comment: Quit drinking alcohol 74yrs ago;very little  . Drug Use: No  . Sexually Active: Not on file   Other Topics Concern  . Not on file   Social History Narrative   Pt lives at home with his spouse and daughter.   Caffeine Use- Quit 01/04/13    No family history on file.  Current Outpatient Prescriptions  Medication Sig Dispense Refill  . clonazePAM (KLONOPIN) 0.5 MG tablet Take 0.5 mg by mouth every other day.       . ibuprofen (ADVIL,MOTRIN) 200 MG tablet Take 200 mg by mouth daily as needed for pain.      . Multiple Vitamin (MULTIVITAMIN WITH MINERALS) TABS Take 1 tablet by mouth daily.      . pantoprazole (PROTONIX) 40 MG tablet Take 1 tablet by mouth daily.      . sertraline (ZOLOFT) 50 MG tablet Take 1 tablet by mouth daily.      . Vitamin D, Ergocalciferol, (DRISDOL) 50000 UNITS CAPS Take 1 capsule by mouth once a  week.      . allopurinol (ZYLOPRIM) 300 MG tablet Take 1 tablet by mouth daily.       No current facility-administered medications for this visit.     No Known Allergies  BP 114/78  Pulse 76  Temp(Src) 97.7 F (36.5 C)  Resp 18  Ht 6\' 3"  (1.905 m)  Wt 335 lb 9.6 oz (152.227 kg)  BMI 41.95 kg/m2  Ct Abdomen Pelvis W Contrast  03/31/2013   **ADDENDUM** CREATED: 03/31/2013 17:28:35  No pancreatic lesion is identified.  **END ADDENDUM** SIGNED BY: Natasha Mead, M.D.  03/31/2013   *RADIOLOGY REPORT*  Clinical Data: Abdominal pain, nausea, weight loss, history of renal stones  CT ABDOMEN AND PELVIS WITH CONTRAST  Technique:  Multidetector CT  imaging of the abdomen and pelvis was performed following the standard protocol during bolus administration of intravenous contrast.  Contrast: OMNIPAQUE IOHEXOL 300 MG/ML  SOLN  Comparison: 03/19/2012  Findings: Sagittal images of the spine shows multilevel degenerative changes thoracolumbar spine.  Lung bases are unremarkable.  Liver, pancreas, spleen and adrenals are unremarkable.  No calcified gallstones are noted within gallbladder.  Nonobstructive calcification in the lower pole of the right kidney measures 1.4 cm.  Nonobstructive calcified calculus in the lower pole of the left kidney measures 3 mm.  Mild atherosclerotic calcifications of the abdominal aorta and the iliac arteries.  No aortic aneurysm.  No hydronephrosis or hydroureter.  No calcified ureteral calculi.  Delayed renal images shows bilateral renal symmetrical excretion. Bilateral visualized proximal ureter is unremarkable.  No small bowel obstruction.  No ascites or free air.  No adenopathy.  There is no pericecal inflammation.  Stool noted within the cecum and right colon.  Some colonic stool noted in transverse colon.  No distal colonic obstruction.  Prostate gland measures 4.4 x 5.1 cm.  Small right inguinal scrotal canal hernia containing fat measures about 3.2 cm without evidence of acute complication.  Tiny left inguinal canal hernia containing fat without acute complication.  Mild degenerative changes bilateral SI joints.  IMPRESSION:  1.  No acute inflammatory process within abdomen or pelvis. 2.  No hydronephrosis or hydroureter.  Bilateral nonobstructive nephrolithiasis. 3.  No calcified ureteral calculi. 4.  No pericecal inflammation. 5.  No small bowel or colonic obstruction.   Original Report Authenticated By: Natasha Mead, M.D.     Review of Systems  Constitutional: Positive for activity change and fatigue. Negative for fever, chills, diaphoresis and appetite change.  HENT: Negative for nosebleeds, sore throat, facial  swelling, mouth sores, trouble swallowing and ear discharge.   Eyes: Negative for photophobia and discharge.  Respiratory: Negative for choking, chest tightness, shortness of breath, wheezing and stridor.   Cardiovascular: Negative for chest pain, palpitations and leg swelling.  Gastrointestinal: Negative for nausea, vomiting, diarrhea, constipation, blood in stool, abdominal distention, anal bleeding and rectal pain.  Endocrine: Negative for cold intolerance, heat intolerance and polyuria.  Genitourinary: Negative for dysuria, urgency, frequency, hematuria, flank pain, discharge, penile swelling, scrotal swelling, enuresis, difficulty urinating, genital sores, penile pain and testicular pain.       Mild impotence  Musculoskeletal: Positive for myalgias and arthralgias. Negative for back pain and gait problem.       Occasional hip/back/elbow pain  Skin: Negative for color change, pallor, rash and wound.  Allergic/Immunologic: Negative for environmental allergies and food allergies.  Neurological: Negative for dizziness, speech difficulty, weakness, numbness and headaches.  Hematological: Negative for adenopathy. Does not bruise/bleed easily.  Psychiatric/Behavioral: Negative  for hallucinations, confusion and agitation.       Objective:   Physical Exam  Constitutional: He is oriented to person, place, and time. He appears well-developed and well-nourished. No distress.  HENT:  Head: Normocephalic.  Mouth/Throat: Oropharynx is clear and moist. No oropharyngeal exudate.  Eyes: Conjunctivae and EOM are normal. Pupils are equal, round, and reactive to light. No scleral icterus.  Neck: Normal range of motion. Neck supple. No tracheal deviation present.  Cardiovascular: Normal rate, regular rhythm and intact distal pulses.   Pulmonary/Chest: Effort normal and breath sounds normal. No respiratory distress.  Abdominal: Soft. Bowel sounds are normal. He exhibits no shifting dullness, no distension,  no fluid wave, no abdominal bruit, no ascites and no pulsatile midline mass. There is no tenderness. There is no rigidity, no rebound, no guarding, no CVA tenderness, no tenderness at McBurney's point and negative Murphy's sign. No hernia. Hernia confirmed negative in the ventral area, confirmed negative in the right inguinal area and confirmed negative in the left inguinal area.  Obese but soft   Musculoskeletal: Normal range of motion. He exhibits no tenderness.  Lymphadenopathy:    He has no cervical adenopathy.       Right: No inguinal adenopathy present.       Left: No inguinal adenopathy present.  Neurological: He is alert and oriented to person, place, and time. No cranial nerve deficit. He exhibits normal muscle tone. Coordination normal.  Skin: Skin is warm and dry. No rash noted. He is not diaphoretic. No erythema. No pallor.  Psychiatric: He has a normal mood and affect. His speech is normal and behavior is normal. Judgment and thought content normal. Cognition and memory are normal.  Initially seemed disspirited but did perk up and smile at the end   Kennieth Francois, MD Fri Mar 31, 2013 5:32:35 PM EDT       **ADDENDUM** CREATED: 03/31/2013 17:28:35  No pancreatic lesion is identified.  **END ADDENDUM** SIGNED BY: Natasha Mead, M.D.      Study Result    *RADIOLOGY REPORT*  Clinical Data: Abdominal pain, nausea, weight loss, history of  renal stones  CT ABDOMEN AND PELVIS WITH CONTRAST  Technique: Multidetector CT imaging of the abdomen and pelvis was  performed following the standard protocol during bolus  administration of intravenous contrast.  Contrast: OMNIPAQUE IOHEXOL 300 MG/ML SOLN  Comparison: 03/19/2012  Findings: Sagittal images of the spine shows multilevel  degenerative changes thoracolumbar spine.  Lung bases are unremarkable.  Liver, pancreas, spleen and adrenals are unremarkable. No  calcified gallstones are noted within gallbladder. Nonobstructive   calcification in the lower pole of the right kidney measures 1.4  cm. Nonobstructive calcified calculus in the lower pole of the  left kidney measures 3 mm. Mild atherosclerotic calcifications of  the abdominal aorta and the iliac arteries. No aortic aneurysm.  No hydronephrosis or hydroureter. No calcified ureteral calculi.  Delayed renal images shows bilateral renal symmetrical excretion.  Bilateral visualized proximal ureter is unremarkable.  No small bowel obstruction. No ascites or free air. No  adenopathy. There is no pericecal inflammation. Stool noted  within the cecum and right colon. Some colonic stool noted in  transverse colon.  No distal colonic obstruction. Prostate gland measures 4.4 x 5.1  cm. Small right inguinal scrotal canal hernia containing fat  measures about 3.2 cm without evidence of acute complication. Tiny  left inguinal canal hernia containing fat without acute  complication. Mild degenerative changes bilateral SI joints.  IMPRESSION:  1. No acute inflammatory process within abdomen or pelvis.  2. No hydronephrosis or hydroureter. Bilateral nonobstructive  nephrolithiasis.  3. No calcified ureteral calculi.  4. No pericecal inflammation.  5. No small bowel or colonic obstruction.  Original Report Authenticated By: Natasha Mead, M.D.       Assessment:     Incidental fat in inguinal canals but no H&P suspicious for inguinal hernias     Plan:     I think that the findings are subtle on CT scan & are more consistent with incidental spermatic cord lipomas which is not surprising given his morbid obesity.  While he does suffer from myalgias and arthralgias elsewhere, he has no groin symptoms.  Despite his morbid obesity, I can get a good exam behind his [place].  I see no need for diagnostic laparoscopy ina n asymptomatic patient.  Should he develop persistent groin pain/swelling, consider re-evaluation.  He feels reassured  We spent some time talking about  his other complaints, but I tried to reassure him that the lack of abnormalities on consultation / evaluations & the improvement in symptoms is encouraging.  He seemed more hopeful.  Increase activity as tolerated to regular activity.  Low impact exercise such as walking an hour a day at least ideal.  Do not push through pain.  Cintinue PPI for PUD.  F/u with GI as needed to make sure the ulcer/gastritis heals  Keep 5 yr appt f/u for colon polyps  Continue Neurology f/u as needed for workup  Doubt mold an issue in basement.  Would expect more respiratory/pulmonary issues.   Changing work area for 3 months to see if that improves things & getting Radon/CO testing in basement is reasonable  Diet as tolerated.  Low fat high fiber diet ideal.  Bowel regimen with 30 g fiber a day and fiber supplement as needed to avoid problems.  Return to clinic as needed.   Instructions discussed.  Followup with primary care physician for other health issues as would normally be done.  Questions answered.  The patient expressed understanding and appreciation

## 2013-04-19 NOTE — Patient Instructions (Addendum)
I see no evidence on exam for any inguinal or bellybutton hernias.  It was a borderline call on the CAT scan anyway.  GETTING TO GOOD BOWEL HEALTH. Irregular bowel habits such as constipation and diarrhea can lead to many problems over time.  Having one soft bowel movement a day is the most important way to prevent further problems.  The anorectal canal is designed to handle stretching and feces to safely manage our ability to get rid of solid waste (feces, poop, stool) out of our body.  BUT, hard constipated stools can act like ripping concrete bricks and diarrhea can be a burning fire to this very sensitive area of our body, causing inflamed hemorrhoids, anal fissures, increasing risk is perirectal abscesses, abdominal pain/bloating, an making irritable bowel worse.     The goal: ONE SOFT BOWEL MOVEMENT A DAY!  To have soft, regular bowel movements:    Drink at least 8 tall glasses of water a day.     Take plenty of fiber.  Fiber is the undigested part of plant food that passes into the colon, acting s "natures broom" to encourage bowel motility and movement.  Fiber can absorb and hold large amounts of water. This results in a larger, bulkier stool, which is soft and easier to pass. Work gradually over several weeks up to 6 servings a day of fiber (25g a day even more if needed) in the form of: o Vegetables -- Root (potatoes, carrots, turnips), leafy green (lettuce, salad greens, celery, spinach), or cooked high residue (cabbage, broccoli, etc) o Fruit -- Fresh (unpeeled skin & pulp), Dried (prunes, apricots, cherries, etc ),  or stewed ( applesauce)  o Whole grain breads, pasta, etc (whole wheat)  o Bran cereals    Bulking Agents -- This type of water-retaining fiber generally is easily obtained each day by one of the following:  o Psyllium bran -- The psyllium plant is remarkable because its ground seeds can retain so much water. This product is available as Metamucil, Konsyl, Effersyllium, Per Diem  Fiber, or the less expensive generic preparation in drug and health food stores. Although labeled a laxative, it really is not a laxative.  o Methylcellulose -- This is another fiber derived from wood which also retains water. It is available as Citrucel. o Polyethylene Glycol - and "artificial" fiber commonly called Miralax or Glycolax.  It is helpful for people with gassy or bloated feelings with regular fiber o Flax Seed - a less gassy fiber than psyllium   No reading or other relaxing activity while on the toilet. If bowel movements take longer than 5 minutes, you are too constipated   AVOID CONSTIPATION.  High fiber and water intake usually takes care of this.  Sometimes a laxative is needed to stimulate more frequent bowel movements, but    Laxatives are not a good long-term solution as it can wear the colon out. o Osmotics (Milk of Magnesia, Fleets phosphosoda, Magnesium citrate, MiraLax, GoLytely) are safer than  o Stimulants (Senokot, Castor Oil, Dulcolax, Ex Lax)    o Do not take laxatives for more than 7days in a row.    IF SEVERELY CONSTIPATED, try a Bowel Retraining Program: o Do not use laxatives.  o Eat a diet high in roughage, such as bran cereals and leafy vegetables.  o Drink six (6) ounces of prune or apricot juice each morning.  o Eat two (2) large servings of stewed fruit each day.  o Take one (1) heaping tablespoon of  a psyllium-based bulking agent twice a day. Use sugar-free sweetener when possible to avoid excessive calories.  o Eat a normal breakfast.  o Set aside 15 minutes after breakfast to sit on the toilet, but do not strain to have a bowel movement.  o If you do not have a bowel movement by the third day, use an enema and repeat the above steps.    Controlling diarrhea o Switch to liquids and simpler foods for a few days to avoid stressing your intestines further. o Avoid dairy products (especially milk & ice cream) for a short time.  The intestines often can lose  the ability to digest lactose when stressed. o Avoid foods that cause gassiness or bloating.  Typical foods include beans and other legumes, cabbage, broccoli, and dairy foods.  Every person has some sensitivity to other foods, so listen to our body and avoid those foods that trigger problems for you. o Adding fiber (Citrucel, Metamucil, psyllium, Miralax) gradually can help thicken stools by absorbing excess fluid and retrain the intestines to act more normally.  Slowly increase the dose over a few weeks.  Too much fiber too soon can backfire and cause cramping & bloating. o Probiotics (such as active yogurt, Align, etc) may help repopulate the intestines and colon with normal bacteria and calm down a sensitive digestive tract.  Most studies show it to be of mild help, though, and such products can be costly. o Medicines:   Bismuth subsalicylate (ex. Kayopectate, Pepto Bismol) every 30 minutes for up to 6 doses can help control diarrhea.  Avoid if pregnant.   Loperamide (Immodium) can slow down diarrhea.  Start with two tablets (4mg  total) first and then try one tablet every 6 hours.  Avoid if you are having fevers or severe pain.  If you are not better or start feeling worse, stop all medicines and call your doctor for advice o Call your doctor if you are getting worse or not better.  Sometimes further testing (cultures, endoscopy, X-ray studies, bloodwork, etc) may be needed to help diagnose and treat the cause of the diarrhea.  Exercise to Stay Healthy Exercise helps you become and stay healthy. EXERCISE IDEAS AND TIPS Choose exercises that:  You enjoy.  Fit into your day. You do not need to exercise really hard to be healthy. You can do exercises at a slow or medium level and stay healthy. You can:  Stretch before and after working out.  Try yoga, Pilates, or tai chi.  Lift weights.  Walk fast, swim, jog, run, climb stairs, bicycle, dance, or rollerskate.  Take aerobic  classes. Exercises that burn about 150 calories:  Running 1  miles in 15 minutes.  Playing volleyball for 45 to 60 minutes.  Washing and waxing a car for 45 to 60 minutes.  Playing touch football for 45 minutes.  Walking 1  miles in 35 minutes.  Pushing a stroller 1  miles in 30 minutes.  Playing basketball for 30 minutes.  Raking leaves for 30 minutes.  Bicycling 5 miles in 30 minutes.  Walking 2 miles in 30 minutes.  Dancing for 30 minutes.  Shoveling snow for 15 minutes.  Swimming laps for 20 minutes.  Walking up stairs for 15 minutes.  Bicycling 4 miles in 15 minutes.  Gardening for 30 to 45 minutes.  Jumping rope for 15 minutes.  Washing windows or floors for 45 to 60 minutes. Document Released: 11/07/2010 Document Revised: 12/28/2011 Document Reviewed: 11/07/2010 [employer] Patient Information 2014 [employer]  Myalgia, Adult Myalgia is the medical term for muscle pain. It is a symptom of many things. Nearly everyone at some time in their life has this. The most common cause for muscle pain is overuse or straining and more so when you are not in shape. Injuries and muscle bruises cause myalgias. Muscle pain without a history of injury can also be caused by a virus. It frequently comes along with the flu. Myalgia not caused by muscle strain can be present in a large number of infectious diseases. Some autoimmune diseases like lupus and fibromyalgia can cause muscle pain. Myalgia may be mild, or severe. SYMPTOMS  The symptoms of myalgia are simply muscle pain. Most of the time this is short lived and the pain goes away without treatment. DIAGNOSIS  Myalgia is diagnosed by your caregiver by taking your history. This means you tell him when the problems began, what they are, and what has been happening. If this has not been a long term problem, your caregiver may want to watch for a while to see what will happen. If it has been long term, they may want to do  additional testing. TREATMENT  The treatment depends on what the underlying cause of the muscle pain is. Often anti-inflammatory medications will help. HOME CARE INSTRUCTIONS  If the pain in your muscles came from overuse, slow down your activities until the problems go away.  Myalgia from overuse of a muscle can be treated with alternating hot and cold packs on the muscle affected or with cold for the first couple days. If either heat or cold seems to make things worse, stop their use.  Apply ice to the sore area for 15-20 minutes, 3-4 times per day, while awake for the first 2 days of muscle soreness, or as directed. Put the ice in a plastic bag and place a towel between the bag of ice and your skin.  Only take over-the-counter or prescription medicines for pain, discomfort, or fever as directed by your caregiver.  Regular gentle exercise may help if you are not active.  Stretching before strenuous exercise can help lower the risk of myalgia. It is normal when beginning an exercise regimen to feel some muscle pain after exercising. Muscles that have not been used frequently will be sore at first. If the pain is extreme, this may mean injury to a muscle. SEEK MEDICAL CARE IF:  You have an increase in muscle pain that is not relieved with medication.  You begin to run a temperature.  You develop nausea and vomiting.  You develop a stiff and painful neck.  You develop a rash.  You develop muscle pain after a tick bite.  You have continued muscle pain while working out even after you are in good condition. SEEK IMMEDIATE MEDICAL CARE IF: Any of your problems are getting worse and medications are not helping. MAKE SURE YOU:   Understand these instructions.  Will watch your condition.  Will get help right away if you are not doing well or get worse. Document Released: 08/27/2006 Document Revised: 12/28/2011 Document Reviewed: 11/16/2006 [facility] Patient Information 2014  [place], [place].  Fatigue Fatigue is a feeling of tiredness, lack of energy, lack of motivation, or feeling tired all the time. Having enough rest, good nutrition, and reducing stress will normally reduce fatigue. Consult your caregiver if it persists. The nature of your fatigue will help your caregiver to find out its cause. The treatment is based on the cause.  CAUSES  There are many causes for fatigue. Most of the time, fatigue can be traced to one or more of your habits or routines. Most causes fit into one or more of three general areas. They are: Lifestyle problems  Sleep disturbances.  Overwork.  Physical exertion.  Unhealthy habits.  Poor eating habits or eating disorders.  Alcohol and/or drug use .  Lack of proper nutrition (malnutrition). Psychological problems  Stress and/or anxiety problems.  Depression.  Grief.  Boredom. Medical Problems or Conditions  Anemia.  Pregnancy.  Thyroid gland problems.  Recovery from major surgery.  Continuous pain.  Emphysema or asthma that is not well controlled  Allergic conditions.  Diabetes.  Infections (such as mononucleosis).  Obesity.  Sleep disorders, such as sleep apnea.  Heart failure or other heart-related problems.  Cancer.  Kidney disease.  Liver disease.  Effects of certain medicines such as antihistamines, cough and cold remedies, prescription pain medicines, heart and blood pressure medicines, drugs used for treatment of cancer, and some antidepressants. SYMPTOMS  The symptoms of fatigue include:   Lack of energy.  Lack of drive (motivation).  Drowsiness.  Feeling of indifference to the surroundings. DIAGNOSIS  The details of how you feel help guide your caregiver in finding out what is causing the fatigue. You will be asked about your present and past health condition. It is important to review all medicines that you take, including prescription and non-prescription items. A thorough  exam will be done. You will be questioned about your feelings, habits, and normal lifestyle. Your caregiver may suggest blood tests, urine tests, or other tests to look for common medical causes of fatigue.  TREATMENT  Fatigue is treated by correcting the underlying cause. For example, if you have continuous pain or depression, treating these causes will improve how you feel. Similarly, adjusting the dose of certain medicines will help in reducing fatigue.  HOME CARE INSTRUCTIONS   Try to get the required amount of good sleep every night.  Eat a healthy and nutritious diet, and drink enough water throughout the day.  Practice ways of relaxing (including yoga or meditation).  Exercise regularly.  Make plans to change situations that cause stress. Act on those plans so that stresses decrease over time. Keep your work and personal routine reasonable.  Avoid street drugs and minimize use of alcohol.  Start taking a daily multivitamin after consulting your caregiver. SEEK MEDICAL CARE IF:   You have persistent tiredness, which cannot be accounted for.  You have fever.  You have unintentional weight loss.  You have headaches.  You have disturbed sleep throughout the night.  You are feeling sad.  You have constipation.  You have dry skin.  You have gained weight.  You are taking any new or different medicines that you suspect are causing fatigue.  You are unable to sleep at night.  You develop any unusual swelling of your legs or other parts of your body. SEEK IMMEDIATE MEDICAL CARE IF:   You are feeling confused.  Your vision is blurred.  You feel faint or pass out.  You develop severe headache.  You develop severe abdominal, pelvic, or back pain.  You develop chest pain, shortness of breath, or an irregular or fast heartbeat.  You are unable to pass a normal amount of urine.  You develop abnormal bleeding such as bleeding from the rectum or you vomit  blood.  You have thoughts about harming yourself or committing suicide.  You are worried that you might harm  someone else. MAKE SURE YOU:   Understand these instructions.  Will watch your condition.  Will get help right away if you are not doing well or get worse. Document Released: 08/02/2007 Document Revised: 12/28/2011 Document Reviewed: 08/02/2007 [facility] Patient Information 2014 [place], [place].

## 2013-05-01 ENCOUNTER — Encounter (INDEPENDENT_AMBULATORY_CARE_PROVIDER_SITE_OTHER): Payer: Self-pay

## 2014-02-28 DIAGNOSIS — N529 Male erectile dysfunction, unspecified: Secondary | ICD-10-CM | POA: Insufficient documentation

## 2014-05-19 DIAGNOSIS — G4730 Sleep apnea, unspecified: Secondary | ICD-10-CM | POA: Insufficient documentation

## 2014-05-19 DIAGNOSIS — N201 Calculus of ureter: Secondary | ICD-10-CM | POA: Insufficient documentation

## 2015-12-27 ENCOUNTER — Other Ambulatory Visit: Payer: Self-pay | Admitting: Orthopedic Surgery

## 2015-12-27 DIAGNOSIS — M545 Low back pain: Secondary | ICD-10-CM

## 2016-01-05 ENCOUNTER — Ambulatory Visit
Admission: RE | Admit: 2016-01-05 | Discharge: 2016-01-05 | Disposition: A | Discharge status: Home / Self Care | Payer: BC Managed Care – PPO | Source: Ambulatory Visit | Attending: Orthopedic Surgery | Admitting: Orthopedic Surgery

## 2016-01-05 DIAGNOSIS — M545 Low back pain: Secondary | ICD-10-CM

## 2016-01-24 ENCOUNTER — Ambulatory Visit (INDEPENDENT_AMBULATORY_CARE_PROVIDER_SITE_OTHER): Payer: BC Managed Care – PPO | Admitting: Neurology

## 2016-01-24 ENCOUNTER — Encounter: Payer: Self-pay | Admitting: Neurology

## 2016-01-24 VITALS — BP 130/86 | HR 75 | Ht 74.0 in | Wt 388.0 lb

## 2016-01-24 DIAGNOSIS — M539 Dorsopathy, unspecified: Secondary | ICD-10-CM | POA: Diagnosis not present

## 2016-01-24 DIAGNOSIS — R258 Other abnormal involuntary movements: Secondary | ICD-10-CM

## 2016-01-24 DIAGNOSIS — R253 Fasciculation: Secondary | ICD-10-CM

## 2016-01-24 NOTE — Progress Notes (Signed)
Chart forwarded.  

## 2016-01-24 NOTE — Progress Notes (Signed)
NEUROLOGY CONSULTATION NOTE  Luke Larsen MRN: [id number] DOB: 12/20/1955  Referring provider: Dr. Dema Severin Primary care provider: Dr. Dema Severin  Reason for consult:  Muscle twitching  HISTORY OF PRESENT ILLNESS: Luke Larsen is a 60 year old left-handed male with hypercholesterolemia and gout who presents for muscle twitching.  History obtained by patient, PCP note and prior neurologist's notes.  Labs, EMG report and imaging of brain and lumbar MRIs reviewed.  Fatigue, intermittent muscle twitching and tingling left arm, tremor, malaise, insomnia, pain.  In 2014, he developed twitching over the entire body.  It was fairly sudden onset.  He denies preceding viral illness.  He denied any significant stressors.  It could occur in the thigh, abdomen or arms.  If he looked, he could actually see the muscle twitching.  Sometimes his eyelid would twitch too.  He was concerned about ALS.  He was evaluated by a neurologist at that time.  Labs from 02/08/13 showed CK 104, B12 525 and Hgb A1c 6.  MRI of brain without contrast from 02/14/13 was normal.  He had a NCV-EMG on 03/29/13, which showed incidental bilateral lumbosacral radiculopathies but did not reveal electrodiagnostic evidence of peripheral neuropathy or myopathy.  At that time, he also developed low back, hip and leg pain, as well as pain between the shoulders.  Eventually, he developed pain in the neck and mid back as well.  He underwent a rheumatologic workup.  Labs from September 2016 include TSH 3.25, Lyme negative, ACE 35, CCP antibodies IgG/IgA 4, SPEP/IFE negative for M-spike, RF 15, ANA negative, and CPK 93.  CBC and CMP unremarkable.  At first, he was given a diagnosis of fibromyalgia and was started on Cymbalta, which caused side effects.  He currently takes Lyrica, which helps a little.  He then saw a different rheumatologist, who reportedly took radiographs of his cervical, thoracic and lumbar spine.  He was told that he had significant  degenerative disc disease, which is likely the cause of his pain and not fibromyalgia.  He did have an MRI of lumbar spine performed on 01/05/16 for low back pain, which revealed degenerative disc disease and spondylosis causing multilevel nerve root impingement.  He still has twitching but it is improved and only occasional.  He denies any progressive weakness in the extremities.  He denies dysphagia.  He denies muscle atrophy.  He denies numbness except for occasional mild tingling in the feet.  In 2014, he reportedly lost 50 lbs but has since gained the weight back and is currently trying to lose weight again.    PAST MEDICAL HISTORY: Past Medical History  Diagnosis Date  . Gout   . Hypercholesterolemia     PAST SURGICAL HISTORY: Past Surgical History  Procedure Laterality Date  . Kidney stone surgery  03/28/2012    MEDICATIONS: Current Outpatient Prescriptions on File Prior to Visit  Medication Sig Dispense Refill  . clonazePAM (KLONOPIN) 0.5 MG tablet Take 1 mg by mouth 2 (two) times daily as needed.     . Multiple Vitamin (MULTIVITAMIN WITH MINERALS) TABS Take 1 tablet by mouth daily.    . Vitamin D, Ergocalciferol, (DRISDOL) 50000 UNITS CAPS Take 1 capsule by mouth once a week.     No current facility-administered medications on file prior to visit.    ALLERGIES: Allergies  Allergen Reactions  . Cymbalta [Duloxetine Hcl]     FAMILY HISTORY: History reviewed. No pertinent family history.  SOCIAL HISTORY: Social History   Social History  . Marital  Status: Married    Spouse Name: Coralyn Mark  . Number of Children: 2  . 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 Smoking status: Never Smoker   . Smokeless tobacco: Never Used  . Alcohol Use: No     Comment: Quit drinking alcohol 64yrs ago;very little  . Drug Use: No  . Sexual Activity: Not on file   Other Topics Concern  . Not on file   Social History  Narrative   Pt lives at home with his spouse and daughter.   Caffeine Use- Quit 01/04/13    REVIEW OF SYSTEMS: Constitutional: No fevers, chills, or sweats, no generalized fatigue, change in appetite Eyes: No visual changes, double vision, eye pain Ear, nose and throat: No hearing loss, ear pain, nasal congestion, sore throat Cardiovascular: No chest pain, palpitations Respiratory:  No shortness of breath at rest or with exertion, wheezes GastrointestinaI: No nausea, vomiting, diarrhea, abdominal pain, fecal incontinence Genitourinary:  No dysuria, urinary retention or frequency Musculoskeletal:  No neck pain, back pain Integumentary: No rash, pruritus, skin lesions Neurological: as above Psychiatric: No depression, insomnia, anxiety Endocrine: No palpitations, fatigue, diaphoresis, mood swings, change in appetite, change in weight, increased thirst Hematologic/Lymphatic:  No anemia, purpura, petechiae. Allergic/Immunologic: no itchy/runny eyes, nasal congestion, recent allergic reactions, rashes  PHYSICAL EXAM: Filed Vitals:   01/24/16 0758  BP: 130/86  Pulse: 75   General: No acute distress.  Patient appears well-groomed.  Head:  Normocephalic/atraumatic Eyes:  fundi unremarkable, without vessel changes, exudates, hemorrhages or papilledema. Neck: supple, no paraspinal tenderness, full range of motion Back: No paraspinal tenderness Heart: regular rate and rhythm Lungs: Clear to auscultation bilaterally. Vascular: No carotid bruits. Neurological Exam: Mental status: alert and oriented to person, place, and time, recent and remote memory intact, fund of knowledge intact, attention and concentration intact, speech fluent and not dysarthric, language intact. Cranial nerves: CN I: not tested CN II: pupils equal, round and reactive to light, visual fields intact, fundi unremarkable, without vessel changes, exudates, hemorrhages or papilledema. CN III, IV, VI:  full range of motion,  no nystagmus, no ptosis CN V: facial sensation intact CN VII: upper and lower face symmetric CN VIII: hearing intact CN IX, X: gag intact, uvula midline CN XI: sternocleidomastoid and trapezius muscles intact CN XII: tongue midline Bulk & Tone: normal, no fasciculations.  No atrophy. Motor:  5/5 throughout Sensation:  Pinprick and vibration sensation intact. Deep Tendon Reflexes:  1+ throughout, toes downgoing. Finger to nose testing:  Without dysmetria.  Heel to shin:  Without dysmetria.  Gait:  Normal station and stride.  Able to turn and tandem walk. Romberg negative.  IMPRESSION: 1.  Muscle twitching.  Not observed on exam today.  Probable benign fasciculations.  Prior workup, including NCV-EMG, was negative. 2.  Degenerative disc disease 3.  Morbid obesity  PLAN: Since symptoms have actually improved a bit, and he does not demonstrate any concerning signs on exam, such as fasciculations, atrophy, weakness or upper motor neuron signs, I would not pursue further testing, such as repeating NCV-EMG.  If he should note a decline, he should contact me and we can re-evaluate and pursue further testing if needed.   He is currently working on losing weight.  Thank you for allowing me to take part in the care of this patient.  [name], DO  CC:  Harlan Stains.

## 2016-01-24 NOTE — Patient Instructions (Signed)
Your neurologic exam is normal.  You may simply have benign fasciculations.  I don't think any further testing is necessary unless you notice any clinical/physical decline.  Follow up as needed.

## 2016-04-14 ENCOUNTER — Other Ambulatory Visit: Payer: Self-pay | Admitting: Physical Medicine and Rehabilitation

## 2016-04-14 DIAGNOSIS — M545 Low back pain: Principal | ICD-10-CM

## 2016-04-14 DIAGNOSIS — G8929 Other chronic pain: Secondary | ICD-10-CM

## 2016-04-23 ENCOUNTER — Ambulatory Visit
Admission: RE | Admit: 2016-04-23 | Discharge: 2016-04-23 | Disposition: A | Discharge status: Home / Self Care | Payer: BC Managed Care – PPO | Source: Ambulatory Visit | Attending: Physical Medicine and Rehabilitation | Admitting: Physical Medicine and Rehabilitation

## 2016-04-23 ENCOUNTER — Other Ambulatory Visit: Payer: Self-pay | Admitting: Physical Medicine and Rehabilitation

## 2016-04-23 DIAGNOSIS — G8929 Other chronic pain: Secondary | ICD-10-CM

## 2016-04-23 DIAGNOSIS — M545 Low back pain: Principal | ICD-10-CM

## 2016-04-23 MED ORDER — METHYLPREDNISOLONE ACETATE 40 MG/ML INJ SUSP (RADIOLOG
120.0000 mg | Freq: Once | INTRAMUSCULAR | Status: AC
  Administered 2016-04-23: 120 mg via EPIDURAL

## 2016-04-23 MED ORDER — IOPAMIDOL (ISOVUE-M 200) INJECTION 41%
1.0000 mL | Freq: Once | INTRAMUSCULAR | Status: AC
  Administered 2016-04-23: 1 mL via EPIDURAL

## 2016-04-23 NOTE — Discharge Instructions (Signed)

## 2016-05-18 ENCOUNTER — Other Ambulatory Visit: Payer: Self-pay | Admitting: Orthopedic Surgery

## 2016-05-18 DIAGNOSIS — M545 Low back pain: Principal | ICD-10-CM

## 2016-05-18 DIAGNOSIS — G8929 Other chronic pain: Secondary | ICD-10-CM

## 2016-05-21 ENCOUNTER — Ambulatory Visit
Admission: RE | Admit: 2016-05-21 | Discharge: 2016-05-21 | Disposition: A | Discharge status: Home / Self Care | Payer: BC Managed Care – PPO | Source: Ambulatory Visit | Attending: Orthopedic Surgery | Admitting: Orthopedic Surgery

## 2016-05-21 DIAGNOSIS — G8929 Other chronic pain: Secondary | ICD-10-CM

## 2016-05-21 DIAGNOSIS — M545 Low back pain: Principal | ICD-10-CM

## 2016-05-21 MED ORDER — IOPAMIDOL (ISOVUE-M 200) INJECTION 41%
1.0000 mL | Freq: Once | INTRAMUSCULAR | Status: AC
  Administered 2016-05-21: 1 mL via EPIDURAL

## 2016-05-21 MED ORDER — METHYLPREDNISOLONE ACETATE 40 MG/ML INJ SUSP (RADIOLOG
120.0000 mg | Freq: Once | INTRAMUSCULAR | Status: AC
  Administered 2016-05-21: 120 mg via EPIDURAL

## 2016-05-21 NOTE — Discharge Instructions (Signed)

## 2016-09-17 ENCOUNTER — Other Ambulatory Visit: Payer: Self-pay | Admitting: Orthopedic Surgery

## 2016-09-17 DIAGNOSIS — H539 Unspecified visual disturbance: Secondary | ICD-10-CM

## 2016-09-17 DIAGNOSIS — M542 Cervicalgia: Secondary | ICD-10-CM

## 2016-09-26 ENCOUNTER — Ambulatory Visit
Admission: RE | Admit: 2016-09-26 | Discharge: 2016-09-26 | Disposition: A | Discharge status: Home / Self Care | Payer: BC Managed Care – PPO | Source: Ambulatory Visit | Attending: Orthopedic Surgery | Admitting: Orthopedic Surgery

## 2016-09-26 DIAGNOSIS — M542 Cervicalgia: Secondary | ICD-10-CM

## 2016-09-26 DIAGNOSIS — H539 Unspecified visual disturbance: Secondary | ICD-10-CM

## 2016-10-16 ENCOUNTER — Other Ambulatory Visit: Payer: Self-pay | Admitting: Orthopedic Surgery

## 2016-10-16 DIAGNOSIS — M48061 Spinal stenosis, lumbar region without neurogenic claudication: Secondary | ICD-10-CM

## 2016-10-23 ENCOUNTER — Ambulatory Visit
Admission: RE | Admit: 2016-10-23 | Discharge: 2016-10-23 | Disposition: A | Discharge status: Home / Self Care | Payer: BC Managed Care – PPO | Source: Ambulatory Visit | Attending: Orthopedic Surgery | Admitting: Orthopedic Surgery

## 2016-10-23 DIAGNOSIS — M48061 Spinal stenosis, lumbar region without neurogenic claudication: Secondary | ICD-10-CM

## 2016-10-23 MED ORDER — IOPAMIDOL (ISOVUE-M 200) INJECTION 41%
1.0000 mL | Freq: Once | INTRAMUSCULAR | Status: AC
  Administered 2016-10-23: 1 mL via EPIDURAL

## 2016-10-23 MED ORDER — METHYLPREDNISOLONE ACETATE 40 MG/ML INJ SUSP (RADIOLOG
120.0000 mg | Freq: Once | INTRAMUSCULAR | Status: AC
  Administered 2016-10-23: 120 mg via EPIDURAL

## 2017-02-11 ENCOUNTER — Other Ambulatory Visit: Payer: Self-pay | Admitting: Orthopedic Surgery

## 2017-02-11 ENCOUNTER — Ambulatory Visit
Admission: RE | Admit: 2017-02-11 | Discharge: 2017-02-11 | Disposition: A | Discharge status: Home / Self Care | Payer: BC Managed Care – PPO | Source: Ambulatory Visit | Attending: Orthopedic Surgery | Admitting: Orthopedic Surgery

## 2017-02-11 ENCOUNTER — Other Ambulatory Visit: Payer: BC Managed Care – PPO

## 2017-02-11 DIAGNOSIS — M4807 Spinal stenosis, lumbosacral region: Secondary | ICD-10-CM

## 2017-02-11 MED ORDER — IOPAMIDOL (ISOVUE-M 200) INJECTION 41%
1.0000 mL | Freq: Once | INTRAMUSCULAR | Status: AC
  Administered 2017-02-11: 1 mL via EPIDURAL

## 2017-02-11 MED ORDER — METHYLPREDNISOLONE ACETATE 40 MG/ML INJ SUSP (RADIOLOG
120.0000 mg | Freq: Once | INTRAMUSCULAR | Status: AC
  Administered 2017-02-11: 120 mg via EPIDURAL

## 2017-04-30 ENCOUNTER — Other Ambulatory Visit: Payer: Self-pay | Admitting: Orthopedic Surgery

## 2017-04-30 DIAGNOSIS — M48061 Spinal stenosis, lumbar region without neurogenic claudication: Secondary | ICD-10-CM

## 2017-05-10 ENCOUNTER — Ambulatory Visit
Admission: RE | Admit: 2017-05-10 | Discharge: 2017-05-10 | Disposition: A | Discharge status: Home / Self Care | Payer: BC Managed Care – PPO | Source: Ambulatory Visit | Attending: Orthopedic Surgery | Admitting: Orthopedic Surgery

## 2017-05-10 DIAGNOSIS — M48061 Spinal stenosis, lumbar region without neurogenic claudication: Secondary | ICD-10-CM

## 2017-05-10 MED ORDER — IOPAMIDOL (ISOVUE-M 200) INJECTION 41%
1.0000 mL | Freq: Once | INTRAMUSCULAR | Status: AC
  Administered 2017-05-10: 1 mL via EPIDURAL

## 2017-05-10 MED ORDER — METHYLPREDNISOLONE ACETATE 40 MG/ML INJ SUSP (RADIOLOG
120.0000 mg | Freq: Once | INTRAMUSCULAR | Status: AC
  Administered 2017-05-10: 120 mg via EPIDURAL

## 2017-05-10 NOTE — Discharge Instructions (Signed)

## 2017-06-24 ENCOUNTER — Other Ambulatory Visit: Payer: Self-pay | Admitting: Orthopedic Surgery

## 2017-06-24 DIAGNOSIS — M48061 Spinal stenosis, lumbar region without neurogenic claudication: Secondary | ICD-10-CM

## 2017-07-07 ENCOUNTER — Other Ambulatory Visit: Payer: Self-pay | Admitting: Orthopedic Surgery

## 2017-07-07 DIAGNOSIS — M542 Cervicalgia: Secondary | ICD-10-CM

## 2017-07-08 ENCOUNTER — Ambulatory Visit
Admission: RE | Admit: 2017-07-08 | Discharge: 2017-07-08 | Disposition: A | Discharge status: Home / Self Care | Payer: BC Managed Care – PPO | Source: Ambulatory Visit | Attending: Orthopedic Surgery | Admitting: Orthopedic Surgery

## 2017-07-08 DIAGNOSIS — M48061 Spinal stenosis, lumbar region without neurogenic claudication: Secondary | ICD-10-CM

## 2017-07-09 ENCOUNTER — Ambulatory Visit
Admission: RE | Admit: 2017-07-09 | Discharge: 2017-07-09 | Disposition: A | Discharge status: Home / Self Care | Payer: BC Managed Care – PPO | Source: Ambulatory Visit | Attending: Orthopedic Surgery | Admitting: Orthopedic Surgery

## 2017-07-09 DIAGNOSIS — M542 Cervicalgia: Secondary | ICD-10-CM

## 2017-07-09 MED ORDER — IOPAMIDOL (ISOVUE-M 300) INJECTION 61%
1.0000 mL | Freq: Once | INTRAMUSCULAR | Status: AC | PRN
  Administered 2017-07-09: 1 mL via EPIDURAL

## 2017-07-09 MED ORDER — TRIAMCINOLONE ACETONIDE 40 MG/ML IJ SUSP (RADIOLOGY)
60.0000 mg | Freq: Once | INTRAMUSCULAR | Status: AC
  Administered 2017-07-09: 60 mg via EPIDURAL

## 2017-07-09 NOTE — Discharge Instructions (Signed)

## 2017-11-23 ENCOUNTER — Other Ambulatory Visit: Payer: Self-pay | Admitting: Orthopedic Surgery

## 2017-11-23 DIAGNOSIS — M48061 Spinal stenosis, lumbar region without neurogenic claudication: Secondary | ICD-10-CM

## 2017-12-02 ENCOUNTER — Ambulatory Visit
Admission: RE | Admit: 2017-12-02 | Discharge: 2017-12-02 | Disposition: A | Discharge status: Home / Self Care | Payer: BC Managed Care – PPO | Source: Ambulatory Visit | Attending: Orthopedic Surgery | Admitting: Orthopedic Surgery

## 2017-12-02 DIAGNOSIS — M48061 Spinal stenosis, lumbar region without neurogenic claudication: Secondary | ICD-10-CM

## 2017-12-02 MED ORDER — IOPAMIDOL (ISOVUE-M 200) INJECTION 41%
1.0000 mL | Freq: Once | INTRAMUSCULAR | Status: AC
  Administered 2017-12-02: 1 mL via EPIDURAL

## 2017-12-02 MED ORDER — METHYLPREDNISOLONE ACETATE 40 MG/ML INJ SUSP (RADIOLOG
120.0000 mg | Freq: Once | INTRAMUSCULAR | Status: AC
  Administered 2017-12-02: 120 mg via EPIDURAL

## 2018-03-16 ENCOUNTER — Other Ambulatory Visit: Payer: Self-pay | Admitting: Orthopedic Surgery

## 2018-03-16 DIAGNOSIS — M545 Low back pain: Principal | ICD-10-CM

## 2018-03-16 DIAGNOSIS — G8929 Other chronic pain: Secondary | ICD-10-CM

## 2018-03-25 ENCOUNTER — Ambulatory Visit
Admission: RE | Admit: 2018-03-25 | Discharge: 2018-03-25 | Disposition: A | Discharge status: Home / Self Care | Payer: BC Managed Care – PPO | Source: Ambulatory Visit | Attending: Orthopedic Surgery | Admitting: Orthopedic Surgery

## 2018-03-25 DIAGNOSIS — M545 Low back pain: Principal | ICD-10-CM

## 2018-03-25 DIAGNOSIS — G8929 Other chronic pain: Secondary | ICD-10-CM

## 2018-03-25 MED ORDER — IOPAMIDOL (ISOVUE-M 200) INJECTION 41%
1.0000 mL | Freq: Once | INTRAMUSCULAR | Status: AC
  Administered 2018-03-25: 1 mL via EPIDURAL

## 2018-03-25 MED ORDER — METHYLPREDNISOLONE ACETATE 40 MG/ML INJ SUSP (RADIOLOG
120.0000 mg | Freq: Once | INTRAMUSCULAR | Status: AC
  Administered 2018-03-25: 120 mg via EPIDURAL

## 2018-03-25 NOTE — Discharge Instructions (Signed)

## 2018-07-20 ENCOUNTER — Other Ambulatory Visit: Payer: Self-pay | Admitting: Orthopedic Surgery

## 2018-07-20 DIAGNOSIS — G8929 Other chronic pain: Secondary | ICD-10-CM

## 2018-07-20 DIAGNOSIS — M545 Low back pain: Principal | ICD-10-CM

## 2018-08-03 ENCOUNTER — Ambulatory Visit
Admission: RE | Admit: 2018-08-03 | Discharge: 2018-08-03 | Disposition: A | Discharge status: Home / Self Care | Payer: BC Managed Care – PPO | Source: Ambulatory Visit | Attending: Orthopedic Surgery | Admitting: Orthopedic Surgery

## 2018-08-03 ENCOUNTER — Other Ambulatory Visit: Payer: Self-pay | Admitting: Orthopedic Surgery

## 2018-08-03 DIAGNOSIS — M545 Low back pain, unspecified: Secondary | ICD-10-CM

## 2018-08-03 DIAGNOSIS — G8929 Other chronic pain: Secondary | ICD-10-CM

## 2018-08-03 MED ORDER — IOPAMIDOL (ISOVUE-M 200) INJECTION 41%
1.0000 mL | Freq: Once | INTRAMUSCULAR | Status: AC
  Administered 2018-08-03: 1 mL via INTRA_ARTICULAR

## 2018-08-03 MED ORDER — METHYLPREDNISOLONE ACETATE 40 MG/ML INJ SUSP (RADIOLOG
120.0000 mg | Freq: Once | INTRAMUSCULAR | Status: AC
  Administered 2018-08-03: 120 mg via INTRA_ARTICULAR

## 2018-08-26 NOTE — Progress Notes (Signed)
Office Visit Note  Patient: Luke Larsen             Date of Birth: [birth year]           MRN: [id number]             PCP: Harlan Stains, MD Referring: Harlan Stains, MD Visit Date: 09/02/2018 Occupation: @GUAROCC @  Subjective:  Left shoulder pain   History of Present Illness: Luke Larsen is a 62 y.o. male with history of myofascial pain syndrome, gout, and positive rheumatoid factor.  He states that he has been following up with Dr. Lynann Bologna on a regular basis for chronic neck and lower back pain.  He states that he has had several C-spine and lumbar spine epidurals as well as facet joint injections without any relief.  He takes Robaxin 750 mg BID PRN and Oxycodone 10 mg 1 tablet by mouth 4-5 times daily PRN for pain relief.  He has started having increased left shoulder pain that radiates to the left clavicle.  He has painful left shoulder ROM.  He states he has been having worsening muscle tenderness, muscle twitching, and muscle aches.  He states he has been evaluated by neurology who did not find a cause for the muscle twitching.  He has chronic fatigue and insomnia.  He takes allopurinol 300 mg by mouth intermittently. He does not take it on a daily basis as prescribed.  He has not had a gout flare in over 10 years according to the patient.   He states that he had recent lab work performed that revealed a positive ANA.  He denies any recent rashes, sores in mouth or nose, swollen lymph nodes, sun sensitivity, symptoms of raynaud's, or sicca symptoms. He denies a family history of autoimmune disease.    Activities of Daily Living:  Patient reports morning stiffness all day.   Patient Reports nocturnal pain.  Difficulty dressing/grooming: Denies Difficulty climbing stairs: Denies Difficulty getting out of chair: Reports Difficulty using hands for taps, buttons, cutlery, and/or writing: Denies  Review of Systems  Constitutional: Positive for fatigue. Negative for night sweats.  HENT:  Negative for mouth sores, trouble swallowing, trouble swallowing, mouth dryness and nose dryness.   Eyes: Negative for pain, redness, itching and dryness.  Respiratory: Negative for cough, hemoptysis, shortness of breath, wheezing and difficulty breathing.   Cardiovascular: Negative for chest pain, palpitations, hypertension, irregular heartbeat and swelling in legs/feet.  Gastrointestinal: Positive for constipation, diarrhea and nausea. Negative for abdominal pain and vomiting.  Endocrine: Negative for increased urination.  Genitourinary: Negative for painful urination and urgency.  Musculoskeletal: Positive for arthralgias, joint pain and morning stiffness. Negative for joint swelling, myalgias, muscle weakness, muscle tenderness and myalgias.  Skin: Negative for color change, rash, hair loss, nodules/bumps, skin tightness, ulcers and sensitivity to sunlight.  Allergic/Immunologic: Negative for susceptible to infections.  Neurological: Positive for headaches. Negative for fainting, light-headedness, memory loss, night sweats and weakness.  Hematological: Negative for swollen glands.  Psychiatric/Behavioral: Negative for depressed mood, confusion and sleep disturbance. The patient is not nervous/anxious.     PMFS History:  Patient Active Problem List   Diagnosis Date Noted  . Apnea, sleep 05/19/2014  . Calculus of left ureter 05/19/2014  . ED (erectile dysfunction) of organic origin 02/28/2014  . Anxiety state, unspecified 04/19/2013  . Obesity, Class III, BMI 40-49.9 (morbid obesity) ([place]) 04/19/2013  . Gastric ulcer by EGD 04/19/2013  . Personal history of colonic polyps 04/19/2013  . 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 Nephrolithiasis - bilateral nonobstructive 04/19/2013  . Other malaise and fatigue 03/21/2013  . Weakness 03/21/2013  . Numbness 03/21/2013    Past Medical History:  Diagnosis Date  . Gout   . Hypercholesterolemia     History reviewed. No pertinent  family history. Past Surgical History:  Procedure Laterality Date  . KIDNEY STONE SURGERY  03/28/2012   Social History   Social History Narrative   Pt lives at home with his spouse and daughter.   Caffeine Use- Quit 01/04/13    Objective: Vital Signs: BP 135/82 (BP Location: Left Arm, Patient Position: Sitting, Cuff Size: Large)   Pulse 71   Resp 16   Ht 6\' 1"  (1.854 m)   Wt (!) 378 lb (171.5 kg)   BMI 49.87 kg/m    Physical Exam  Constitutional: He is oriented to person, place, and time. He appears well-developed and well-nourished.  HENT:  Head: Normocephalic and atraumatic.  No oral or nasal ulcerations. No parotid swelling.   Eyes: Pupils are equal, round, and reactive to light. Conjunctivae and EOM are normal.  Neck: Normal range of motion. Neck supple.  Cardiovascular: Normal rate, regular rhythm and normal heart sounds.  Pulmonary/Chest: Effort normal and breath sounds normal.  Abdominal: Soft. Bowel sounds are normal.  Lymphadenopathy:    He has no cervical adenopathy.  Neurological: He is alert and oriented to person, place, and time.  Skin: Skin is warm and dry. Capillary refill takes less than 2 seconds.  No malar rash noted.  No digital ulcerations.   Psychiatric: He has a normal mood and affect. His behavior is normal.  Nursing note and vitals reviewed.    Musculoskeletal Exam: C-spine, thoracic and lumbar spine good limited range of motion.  No midline spinal tenderness.  No SI joint tenderness.  Full range of motion bilateral shoulder joints.  He has left shoulder discomfort with range of motion.  He has tenderness at the left sternoclavicular joint.  Elbow joints, wrist joints, MCPs and PIPs and DIPs good range of motion no synovitis.  He has complete fist formation bilaterally.  Hip joints, knee joints, ankle joints good range of motion with no synovitis.  No tenderness or swelling of knee joints.  Pedal edema bilaterally.  Tenderness of bilateral trochanter  bursa.  CDAI Exam: CDAI Score: Not documented Patient Global Assessment: Not documented; Provider Global Assessment: Not documented Swollen: Not documented; Tender: Not documented Joint Exam   Not documented   There is currently no information documented on the homunculus. Go to the Rheumatology activity and complete the homunculus joint exam.  Investigation: No additional findings.  Imaging: No results found.  Recent Labs: Lab Results  Component Value Date   WBC 7.9 02/18/2013   HGB 17.0 02/18/2013   PLT 244 02/18/2013   NA 139 02/18/2013   K 4.1 02/18/2013   CL 102 02/18/2013   CO2 24 02/18/2013   GLUCOSE 96 02/18/2013   BUN 11 02/18/2013   CREATININE 0.91 02/18/2013   CALCIUM 9.5 02/18/2013   GFRAA >90 02/18/2013    Speciality Comments: No specialty comments available.  Procedures:  No procedures performed Allergies: Cymbalta [duloxetine hcl]   Assessment / Plan:     Visit Diagnoses: Myofascial pain syndrome: He continues to have generalized muscle aches and muscle tenderness.  He takes Robaxin 750 mg BID PRN and Oxycodone 10 mg 1 tablet 4-5 times daily for pain relief.  He has chronic neck and lower back pain and has had epidural  injections and facet joint injections in the past at [facility] without much relief.  He continues to follow up with Dr. Lynann Bologna.  He has tried taking Cymbalta in the past but did not like the way it made him feel.  We discussed talking to his PCP about other treatment options for myofascial pain syndrome.   Idiopathic chronic gout of multiple sites without tophus: He has not had a gout flare in over 10 years according to the patient.  He takes allopurinol 300 mg intermittently.  His uric acid was checked on 07/18/18 and it was 8.0.    Positive ANA (antinuclear antibody) - 07/18/18: 1:320 Nuclear, homogeneous: He has no features of autoimmune disease at this time.  He has no malar rash.  No joint synovitis or tenderness.  No oral or  nasal ulcerations. No symptoms of Raynaud's or digital ulcerations.  No sicca symptoms.  He has no parotid swelling. He had no palpable cervical lymphadenopathy.  He has chronic fatigue but not recent low grade fevers. He has no family history of autoimmune disease.  He is concerned about the positive ANA. We will check [facility] labs.   Rheumatoid factor positive - RF negative on 07/18/18.  He has no synovitis on exam.   History of vitamin D deficiency: He is taking a vitamin D supplement.   History of anxiety  History of hypercholesterolemia  History of sleep apnea  History of obesity  Renal calcinosis  Personal history of colonic polyps   Orders: No orders of the defined types were placed in this encounter.  No orders of the defined types were placed in this encounter.    Follow-Up Instructions: Return for myofascial pain syndrome, gout, positive RF.   Ofilia Neas, PA-C   I examined and evaluated the patient with Hazel Sams PA.  On my clinical examination patient had no features of autoimmune disease.  He denies any history of oral ulcers, nasal ulcers, malar rash, photosensitivity or hair loss.  He only had discomfort in his left shoulder for which she has been seeing Dr. Lynann Bologna.  We will obtain [facility] labs today.The plan of care was discussed as noted above.  Bo Merino, MD  Note - This record has been created using Editor, commissioning.  Chart creation errors have been sought, but may not always  have been located. Such creation errors do not reflect on  the standard of medical care.

## 2018-09-02 ENCOUNTER — Ambulatory Visit: Payer: BC Managed Care – PPO | Admitting: Rheumatology

## 2018-09-02 ENCOUNTER — Encounter: Payer: Self-pay | Admitting: Rheumatology

## 2018-09-02 VITALS — BP 135/82 | HR 71 | Resp 16 | Ht 73.0 in | Wt 378.0 lb

## 2018-09-02 DIAGNOSIS — R768 Other specified abnormal immunological findings in serum: Secondary | ICD-10-CM | POA: Diagnosis not present

## 2018-09-02 DIAGNOSIS — Z86010 Personal history of colonic polyps: Secondary | ICD-10-CM

## 2018-09-02 DIAGNOSIS — N29 Other disorders of kidney and ureter in diseases classified elsewhere: Secondary | ICD-10-CM

## 2018-09-02 DIAGNOSIS — M7918 Myalgia, other site: Secondary | ICD-10-CM

## 2018-09-02 DIAGNOSIS — Z8639 Personal history of other endocrine, nutritional and metabolic disease: Secondary | ICD-10-CM | POA: Diagnosis not present

## 2018-09-02 DIAGNOSIS — M1A09X0 Idiopathic chronic gout, multiple sites, without tophus (tophi): Secondary | ICD-10-CM

## 2018-09-02 DIAGNOSIS — Z8669 Personal history of other diseases of the nervous system and sense organs: Secondary | ICD-10-CM

## 2018-09-02 DIAGNOSIS — Z8659 Personal history of other mental and behavioral disorders: Secondary | ICD-10-CM

## 2018-09-26 NOTE — Progress Notes (Signed)
Office Visit Note  Patient: Luke Larsen             Date of Birth: [birth year]           MRN: [id number]             PCP: Harlan Stains, MD Referring: Harlan Stains, MD Visit Date: 09/28/2018 Occupation: @GUAROCC @  Subjective:  Generalized pain   History of Present Illness: Luke Larsen is a 62 y.o. male with history of myofascial pain syndrome, gout, and positive ANA. He continues to have generalized muscle aches and tenderness.  He has muscle aches in bilateral lower extremities.  He continues to take Robaxin at bedtime for muscle spasms and Oxycodone for pain relief.He has tried acupuncture, PT, and water exercises in the past without any relief.  He has bilateral shoulder pain.  He has chronic lower back.  He has occasional discomfort in bilateral hands.  He denies any joint swelling.  He denies any recent rashes, sores in mouth or nose, swollen lymph nodes, hair loss, Raynaud's, sicca symptoms, photosensitivity.  He reports he has chronic fatigue and experiences chills and hot flashes.   He denies any recent gout flares.  He takes allopurinol 300 mg po daily.    Activities of Daily Living:  Patient reports morning stiffness for several hours.   Patient Reports nocturnal pain.  Difficulty dressing/grooming: Denies Difficulty climbing stairs: Reports Difficulty getting out of chair: Reports Difficulty using hands for taps, buttons, cutlery, and/or writing: Denies  Review of Systems  Constitutional: Positive for fatigue. Negative for fever.  HENT: Negative for mouth sores, trouble swallowing, trouble swallowing and mouth dryness.   Eyes: Negative for pain, redness, itching and dryness.  Respiratory: Negative for shortness of breath, wheezing and difficulty breathing.   Cardiovascular: Negative for chest pain, palpitations and swelling in legs/feet.  Gastrointestinal: Positive for constipation and diarrhea. Negative for abdominal pain, blood in stool, nausea and vomiting.    Endocrine: Positive for increased urination.  Genitourinary: Positive for pelvic pain. Negative for painful urination and urgency.  Musculoskeletal: Positive for arthralgias, joint pain, joint swelling and morning stiffness.  Skin: Negative for rash and redness.  Allergic/Immunologic: Negative for susceptible to infections.  Neurological: Negative for dizziness, light-headedness, headaches, memory loss and weakness.  Hematological: Positive for bruising/bleeding tendency.  Psychiatric/Behavioral: Negative for confusion. The patient is not nervous/anxious.     PMFS History:  Patient Active Problem List   Diagnosis Date Noted  . Apnea, sleep 05/19/2014  . Calculus of left ureter 05/19/2014  . ED (erectile dysfunction) of organic origin 02/28/2014  . Anxiety state, unspecified 04/19/2013  . Obesity, Class III, BMI 40-49.9 (morbid obesity) ([place]) 04/19/2013  . Gastric ulcer by EGD 04/19/2013  . Personal history of colonic polyps 04/19/2013  . 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 Nephrolithiasis - bilateral nonobstructive 04/19/2013  . Other malaise and fatigue 03/21/2013  . Weakness 03/21/2013  . Numbness 03/21/2013    Past Medical History:  Diagnosis Date  . Gout   . Hypercholesterolemia     History reviewed. No pertinent family history. Past Surgical History:  Procedure Laterality Date  . KIDNEY STONE SURGERY  03/28/2012   Social History   Social History Narrative   Pt lives at home with his spouse and daughter.   Caffeine Use- Quit 01/04/13    Objective: Vital Signs: BP (!) 141/74 (BP Location: Left Arm, Patient Position: Sitting, Cuff Size: Large)   Pulse 81   Resp 16   Ht 6'  1" (1.854 m)   Wt (!) 378 lb 6.4 oz (171.6 kg)   BMI 49.92 kg/m    Physical Exam  Constitutional: He is oriented to person, place, and time. He appears well-developed and well-nourished.  HENT:  Head: Normocephalic and atraumatic.  No parotid swelling. No oral or nasal ulcerations.    Eyes: Pupils are equal, round, and reactive to light. Conjunctivae and EOM are normal.  Neck: Normal range of motion. Neck supple.  Cardiovascular: Normal rate, regular rhythm and normal heart sounds.  Pulmonary/Chest: Effort normal and breath sounds normal.  Abdominal: Soft. Bowel sounds are normal.  Lymphadenopathy:    He has no cervical adenopathy.  Neurological: He is alert and oriented to person, place, and time.  Skin: Skin is warm and dry. Capillary refill takes less than 2 seconds.  No malar rash.  No digital ulcerations or signs of gangrene.   Psychiatric: He has a normal mood and affect. His behavior is normal.  Nursing note and vitals reviewed.    Musculoskeletal Exam: C-spine good ROM with discomfort. Thoracic and lumbar spine limited ROM with discomfort.  No midline spinal tenderness.  No SI joint tenderness. Shoulder joints, elbow joints, wrist joints, MCPs,PIPs, and DIPs good ROM with no synovitis.  Hip joints, knee joints, ankle joints, MTPs, PIPs, and DIPs good ROM with no synovitis.  No warmth or effusion of knee joints.  No tenderness or swelling of ankle joints.  Pedal edema bilaterally.     CDAI Exam: CDAI Score: Not documented Patient Global Assessment: Not documented; Provider Global Assessment: Not documented Swollen: Not documented; Tender: Not documented Joint Exam   Not documented   There is currently no information documented on the homunculus. Go to the Rheumatology activity and complete the homunculus joint exam.  Investigation: No additional findings.  Imaging: No results found.  Recent Labs: Lab Results  Component Value Date   WBC 7.9 02/18/2013   HGB 17.0 02/18/2013   PLT 244 02/18/2013   NA 139 02/18/2013   K 4.1 02/18/2013   CL 102 02/18/2013   CO2 24 02/18/2013   GLUCOSE 96 02/18/2013   BUN 11 02/18/2013   CREATININE 0.91 02/18/2013   CALCIUM 9.5 02/18/2013   GFRAA >90 02/18/2013    Speciality Comments: No specialty comments  available.  Procedures:  No procedures performed Allergies: Cymbalta [duloxetine hcl]   Assessment / Plan:     Visit Diagnoses: Positive ANA (antinuclear antibody) - [facility] labs: ANA 1:320 NS, RNP+, RF 23: He has no clinical features of autoimmune disease at this time.  He has significant pain in multiple joints but no synovitis noted. He has not had any recent rashes, hair loss, photosensitivity, Raynaud's, sicca symptoms, or cervical lymphadenopathy. He would like to take a trial of Plaquenil to see if he notices improvement.  Indications, contraindications, and potential side effects were discussed. He will start taking Plaquenil 200 mg BID.  We will wait for lab work to result before sending in a prescription.  He was advised to notify us if he develops any side effects.  He will follow up in 3 months to assess how he is doing.   Patient was counseled on the purpose, proper use, and adverse effects of hydroxychloroquine including nausea/diarrhea, skin rash, headaches, and sun sensitivity.  Discussed importance of annual eye exams while on hydroxychloroquine to monitor to ocular toxicity and discussed importance of frequent laboratory monitoring.  Provided patient with eye exam form for baseline ophthalmologic exam.  Provided patient with educational  materials on hydroxychloroquine and answered all questions.  Patient consented to hydroxychloroquine.  Will upload consent in the media tab.    Rheumatoid factor positive - RF 23-He has no synovitis on exam.   High risk medication use -He will be started on Plaquenil.  CBC, CMP, and G6PD will be checked today.  He will return for lab work in 46 month.  Plan: Glucose 6 phosphate dehydrogenase, CBC with Differential/Platelet, COMPLETE METABOLIC PANEL WITH GFR   Myofascial pain syndrome -He continues to have generalized muscle aches and muscle tenderness.  He has generalized hyperalgesia on exam. He takes Robaxin 750 mg BID PRN and Oxycodone 10 mg 1 tablet  4-5 times daily for pain relief.  Idiopathic chronic gout of multiple sites without tophus: He has not had any recent gout flares.  He takes Allopurinol 300 mg po daily.  He was advised to notify us if he develops a gout flare.  Other fatigue -He has chronic and worsening fatigue.  He has also been experiencing increasing myalgias.  The following labs will be obtained today.Plan: CK, TSH, Vitamin B12, VITAMIN D 25 Hydroxy (Vit-D Deficiency, Fractures)   Other medical conditions are listed as follows:   History of sleep apnea  History of anxiety  History of vitamin D deficiency  History of hypercholesterolemia  Personal history of colonic polyps  History of obesity  Renal calcinosis    Orders: Orders Placed This Encounter  Procedures  . CK  . TSH  . Vitamin B12  . VITAMIN D 25 Hydroxy (Vit-D Deficiency, Fractures)  . Glucose 6 phosphate dehydrogenase  . CBC with Differential/Platelet  . COMPLETE METABOLIC PANEL WITH GFR  . CBC with Differential/Platelet  . COMPLETE METABOLIC PANEL WITH GFR   No orders of the defined types were placed in this encounter.   Face-to-face time spent with patient was 30 minutes. Greater than 50% of time was spent in counseling and coordination of care.  Follow-Up Instructions: Return in about 3 months (around 12/28/2018) for Myofascial pain syndrome, Gout, Positive ANA.   Ofilia Neas, PA-C   I examined and evaluated the patient with Hazel Sams PA.  Patient had no synovitis on my examination.  He has not had a gout flare.  He has been having a lot of discomfort mostly in the larger joints.  His labs showed positive ANA, positive RNP and positive RF.  We discussed possible trial of Plaquenil to see if his symptoms improve.  We will try Plaquenil over next 3 months.  Indications side effects contraindications were discussed at length.  The plan of care was discussed as noted above.  Bo Merino, MD  Note - This record has been created  using Editor, commissioning.  Chart creation errors have been sought, but may not always  have been located. Such creation errors do not reflect on  the standard of medical care.

## 2018-09-28 ENCOUNTER — Ambulatory Visit: Payer: BC Managed Care – PPO | Admitting: Rheumatology

## 2018-09-28 ENCOUNTER — Telehealth: Payer: Self-pay | Admitting: Pharmacist

## 2018-09-28 ENCOUNTER — Encounter: Payer: Self-pay | Admitting: Rheumatology

## 2018-09-28 VITALS — BP 141/74 | HR 81 | Resp 16 | Ht 73.0 in | Wt 378.4 lb

## 2018-09-28 DIAGNOSIS — M1A09X0 Idiopathic chronic gout, multiple sites, without tophus (tophi): Secondary | ICD-10-CM | POA: Diagnosis not present

## 2018-09-28 DIAGNOSIS — Z79899 Other long term (current) drug therapy: Secondary | ICD-10-CM

## 2018-09-28 DIAGNOSIS — R768 Other specified abnormal immunological findings in serum: Secondary | ICD-10-CM

## 2018-09-28 DIAGNOSIS — Z8639 Personal history of other endocrine, nutritional and metabolic disease: Secondary | ICD-10-CM

## 2018-09-28 DIAGNOSIS — N29 Other disorders of kidney and ureter in diseases classified elsewhere: Secondary | ICD-10-CM

## 2018-09-28 DIAGNOSIS — Z8659 Personal history of other mental and behavioral disorders: Secondary | ICD-10-CM

## 2018-09-28 DIAGNOSIS — Z86010 Personal history of colonic polyps: Secondary | ICD-10-CM

## 2018-09-28 DIAGNOSIS — M7918 Myalgia, other site: Secondary | ICD-10-CM | POA: Diagnosis not present

## 2018-09-28 DIAGNOSIS — R5383 Other fatigue: Secondary | ICD-10-CM

## 2018-09-28 DIAGNOSIS — Z8669 Personal history of other diseases of the nervous system and sense organs: Secondary | ICD-10-CM

## 2018-09-28 NOTE — Telephone Encounter (Signed)
Prescription pending lab results.

## 2018-09-28 NOTE — Progress Notes (Signed)
Pharmacy Note  Subjective: Patient presents today to the [facility] to see Dr. Estanislado Pandy.  Patient seen by the pharmacist for counseling on hydroxychloroquine for autoimmune disease.    Objective: CMP     Component Value Date/Time   NA 139 02/18/2013 0743   K 4.1 02/18/2013 0743   CL 102 02/18/2013 0743   CO2 24 02/18/2013 0743   GLUCOSE 96 02/18/2013 0743   BUN 11 02/18/2013 0743   CREATININE 0.91 02/18/2013 0743   CALCIUM 9.5 02/18/2013 0743   GFRNONAA >90 02/18/2013 0743   GFRAA >90 02/18/2013 0743    CBC    Component Value Date/Time   WBC 7.9 02/18/2013 0743   RBC 5.23 02/18/2013 0743   HGB 17.0 02/18/2013 0743   HCT 45.9 02/18/2013 0743   PLT 244 02/18/2013 0743   MCV 87.8 02/18/2013 0743   MCH 32.5 02/18/2013 0743   MCHC 37.0 (H) 02/18/2013 0743   RDW 12.5 02/18/2013 0743   LYMPHSABS 1.8 02/18/2013 0743   MONOABS 0.6 02/18/2013 0743   EOSABS 0.1 02/18/2013 0743   BASOSABS 0.0 02/18/2013 0743    Assessment/Plan: Patient was prescribed hydroxychloroquine 200 mg twice daily.  Patient was counseled on the purpose, proper use, and adverse effects of hydroxychloroquine including nausea/diarrhea, skin rash, headaches, and sun sensitivity.  Discussed importance of annual eye exams while on hydroxychloroquine to monitor to ocular toxicity and discussed importance of frequent laboratory monitoring. Instructed patient to return for labs in 1 month. Standing orders placed.    Provided patient with eye exam form for baseline ophthalmologic exam and standing lab instructions.  Provided patient with educational materials on hydroxychloroquine and answered all questions.  Patient consented to hydroxychloroquine.  Will upload consent in the media tab.

## 2018-09-29 LAB — CBC WITH DIFFERENTIAL/PLATELET
BASOS PCT: 0.3 %
Basophils Absolute: 23 cells/uL (ref 0–200)
Eosinophils Absolute: 128 cells/uL (ref 15–500)
Eosinophils Relative: 1.7 %
HCT: 45.6 % (ref 38.5–50.0)
Hemoglobin: 15.9 g/dL (ref 13.2–17.1)
Lymphs Abs: 1620 cells/uL (ref 850–3900)
MCH: 31.8 pg (ref 27.0–33.0)
MCHC: 34.9 g/dL (ref 32.0–36.0)
MCV: 91.2 fL (ref 80.0–100.0)
MPV: 10.3 fL (ref 7.5–12.5)
Monocytes Relative: 9.3 %
Neutro Abs: 5033 cells/uL (ref 1500–7800)
Neutrophils Relative %: 67.1 %
Platelets: 276 10*3/uL (ref 140–400)
RBC: 5 10*6/uL (ref 4.20–5.80)
RDW: 12.5 % (ref 11.0–15.0)
Total Lymphocyte: 21.6 %
WBC mixed population: 698 cells/uL (ref 200–950)
WBC: 7.5 10*3/uL (ref 3.8–10.8)

## 2018-09-29 LAB — COMPLETE METABOLIC PANEL WITH GFR
AG RATIO: 2 (calc) (ref 1.0–2.5)
ALT: 57 U/L — ABNORMAL HIGH (ref 9–46)
AST: 40 U/L — ABNORMAL HIGH (ref 10–35)
Albumin: 4.5 g/dL (ref 3.6–5.1)
Alkaline phosphatase (APISO): 76 U/L (ref 40–115)
BUN: 12 mg/dL (ref 7–25)
CO2: 26 mmol/L (ref 20–32)
Calcium: 9.8 mg/dL (ref 8.6–10.3)
Chloride: 103 mmol/L (ref 98–110)
Creat: 1.02 mg/dL (ref 0.70–1.25)
GFR, Est African American: 92 mL/min/{1.73_m2} (ref >=60)
GFR, Est Non African American: 80 mL/min/{1.73_m2} (ref >=60)
Globulin: 2.3 g/dL (calc) (ref 1.9–3.7)
Glucose, Bld: 165 mg/dL — ABNORMAL HIGH (ref 65–99)
Potassium: 4.2 mmol/L (ref 3.5–5.3)
Sodium: 140 mmol/L (ref 135–146)
Total Bilirubin: 0.4 mg/dL (ref 0.2–1.2)
Total Protein: 6.8 g/dL (ref 6.1–8.1)

## 2018-09-29 LAB — TSH: TSH: 2.46 mIU/L (ref 0.40–4.50)

## 2018-09-29 LAB — VITAMIN B12: Vitamin B-12: 363 pg/mL (ref 200–1100)

## 2018-09-29 LAB — GLUCOSE 6 PHOSPHATE DEHYDROGENASE: G-6PDH: 14 U/g Hgb (ref 7.0–20.5)

## 2018-09-29 LAB — VITAMIN D 25 HYDROXY (VIT D DEFICIENCY, FRACTURES): Vit D, 25-Hydroxy: 21 ng/mL — ABNORMAL LOW (ref 30–100)

## 2018-09-29 LAB — CK: Total CK: 111 U/L (ref 44–196)

## 2018-09-29 NOTE — Progress Notes (Signed)
G6PD WNL

## 2018-09-29 NOTE — Progress Notes (Signed)
Vitamin D is low.  Please send in vitamin D 50,000 units by mouth once weekly for 3 months.  We will recheck vitamin in 3 months.   CK WNL. TSH WNL. Vitamin B12 WNL. CBC WNL.  Glucose is elevated-165. LFTs are elevated.  Please advise patient to avoid NSAIDs, tylenol, and alcohol.  Please forward results to PCP.

## 2018-09-30 ENCOUNTER — Telehealth: Payer: Self-pay | Admitting: Rheumatology

## 2018-09-30 DIAGNOSIS — E559 Vitamin D deficiency, unspecified: Secondary | ICD-10-CM

## 2018-09-30 MED ORDER — HYDROXYCHLOROQUINE SULFATE 200 MG PO TABS: 200.0000 mg | ORAL_TABLET | Freq: Two times a day (BID) | ORAL | 2 refills | Status: DC

## 2018-09-30 MED ORDER — VITAMIN D (ERGOCALCIFEROL) 1.25 MG (50000 UNIT) PO CAPS: 50000.0000 [IU] | ORAL_CAPSULE | ORAL | 0 refills | Status: DC

## 2018-09-30 NOTE — Telephone Encounter (Signed)
Spoke with patient and prescription has been sent to the pharmacy.

## 2018-09-30 NOTE — Telephone Encounter (Signed)
Prescription for PLQ was sent the pharmacy per last office note.

## 2018-09-30 NOTE — Telephone Encounter (Signed)
Patient left a voicemail stating he had an appointment on 09/28/18 and at appointment discussed starting a new medication.  Patient is requesting a return call to let him know when he needs to start.

## 2018-09-30 NOTE — Telephone Encounter (Signed)
-----   Message from Ofilia Neas, PA-C sent at 09/29/2018  4:40 PM EST ----- G6PD WNL

## 2018-09-30 NOTE — Telephone Encounter (Signed)
Patient called to check on status of Plaquenil rx. Patient uses [facility] on [address]. Patient prefers to start medication today if at all possible. Please call to advise.

## 2018-10-31 ENCOUNTER — Telehealth: Payer: Self-pay | Admitting: Rheumatology

## 2018-10-31 NOTE — Progress Notes (Signed)
Office Visit Note  Patient: Luke Larsen             Date of Birth: [birth year]           MRN: [id number]             PCP: Harlan Stains, MD Referring: Harlan Stains, MD Visit Date: 11/01/2018 Occupation: @GUAROCC @  Subjective:  Generalized pain    History of Present Illness: Luke Larsen is a 62 y.o. male with history of myofascial pain syndrome, gout, and positive ANA/RF.  He has stopped taking Plaquenil after 5 weeks due to no improvement and worsening of symptoms.  His last dose of PLQ was Monday. He states that he "hurts all over."  He reports myalgias in the chest and trapezius area.  He has discomfort in his lower back and states that he gets radiating pain and numbness down the right leg if he stands for more than 15 minutes. Denies any rashes or skin color changes.  He states he is only able to sleep about 4 hours at a time and the muscle relaxer he takes does not help.  He has issues with fatigue which decreases his activity level. He continues to take oxycodone 10 mg po for pain relief.  He denies any joint swelling.   Activities of Daily Living:  Patient reports morning stiffness for 2 hours.   Patient Reports nocturnal pain.  Difficulty dressing/grooming: Denies Difficulty climbing stairs: Reports Difficulty getting out of chair: Reports Difficulty using hands for taps, buttons, cutlery, and/or writing: Reports  Review of Systems  Constitutional: Positive for fatigue. Negative for night sweats.  HENT: Negative for mouth sores, trouble swallowing, trouble swallowing, mouth dryness and nose dryness.   Eyes: Positive for dryness. Negative for pain, redness and itching.  Respiratory: Negative for cough, hemoptysis, shortness of breath, wheezing and difficulty breathing.   Cardiovascular: Negative for chest pain, palpitations, hypertension, irregular heartbeat and swelling in legs/feet.  Gastrointestinal: Positive for constipation, diarrhea and nausea. Negative for blood in  stool.  Endocrine: Negative for increased urination.  Genitourinary: Negative for painful urination and urgency.  Musculoskeletal: Positive for arthralgias, joint pain, joint swelling and morning stiffness. Negative for myalgias, muscle weakness, muscle tenderness and myalgias.  Skin: Negative for color change, pallor, rash, hair loss, nodules/bumps, skin tightness, ulcers and sensitivity to sunlight.  Allergic/Immunologic: Negative for susceptible to infections.  Neurological: Positive for headaches. Negative for dizziness, fainting, light-headedness, memory loss and night sweats.  Hematological: Negative for swollen glands.  Psychiatric/Behavioral: Negative for depressed mood, confusion and sleep disturbance. The patient is not nervous/anxious.     PMFS History:  Patient Active Problem List   Diagnosis Date Noted  . Apnea, sleep 05/19/2014  . Calculus of left ureter 05/19/2014  . ED (erectile dysfunction) of organic origin 02/28/2014  . Anxiety state, unspecified 04/19/2013  . Obesity, Class III, BMI 40-49.9 (morbid obesity) ([place]) 04/19/2013  . Gastric ulcer by EGD 04/19/2013  . Personal history of colonic polyps 04/19/2013  . 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 Nephrolithiasis - bilateral nonobstructive 04/19/2013  . Other malaise and fatigue 03/21/2013  . Weakness 03/21/2013  . Numbness 03/21/2013    Past Medical History:  Diagnosis Date  . Gout   . Hypercholesterolemia     History reviewed. No pertinent family history. Past Surgical History:  Procedure Laterality Date  . KIDNEY STONE SURGERY  03/28/2012   Social History   Social History Narrative   Pt lives at home with his spouse and  daughter.   Caffeine Use- Quit 01/04/13    There is no immunization history on file for this patient.   Objective: Vital Signs: BP (!) 142/81 (BP Location: Left Arm, Patient Position: Sitting, Cuff Size: Large)   Pulse 75   Resp 16   Ht 6\' 1"  (1.854 m)   Wt (!) 380 lb  (172.4 kg)   BMI 50.13 kg/m    Physical Exam Vitals signs and nursing note reviewed.  Constitutional:      Appearance: He is well-developed.  HENT:     Head: Normocephalic and atraumatic.     Comments: No parotid swelling or tenderness on exam.     Nose:     Comments: No malar ulcerations    Mouth/Throat:     Comments: No oral ulcerations Eyes:     Conjunctiva/sclera: Conjunctivae normal.     Pupils: Pupils are equal, round, and reactive to light.  Neck:     Musculoskeletal: Normal range of motion and neck supple.  Cardiovascular:     Rate and Rhythm: Normal rate and regular rhythm.     Heart sounds: Normal heart sounds.  Pulmonary:     Effort: Pulmonary effort is normal.     Breath sounds: Normal breath sounds.  Abdominal:     General: Bowel sounds are normal.     Palpations: Abdomen is soft.  Lymphadenopathy:     Cervical: No cervical adenopathy.  Skin:    General: Skin is warm and dry.     Capillary Refill: Capillary refill takes less than 2 seconds.     Comments: No malar rash No digital ulcerations or signs of gangrene noted  Neurological:     Mental Status: He is alert and oriented to person, place, and time.  Psychiatric:        Behavior: Behavior normal.      Musculoskeletal Exam: Generalized hyperalgesia on exam. C-spine, thoracic spine, and lumbar spine limited ROM with discomfort.  Shoulder joints, elbow joints, wrist joints, MCPs, PIPs, and DIPs good ROM with no synovitis.  Hip joints, knee joints, ankle joints, MTPs, PIPs, and DIPs good ROM with no synovitis.  No warmth or effusion of knee joints.  No tenderness or swelling of ankle joints.  Pedal edema noted bilaterally.   CDAI Exam: CDAI Score: Not documented Patient Global Assessment: Not documented; Provider Global Assessment: Not documented Swollen: Not documented; Tender: Not documented Joint Exam   Not documented   There is currently no information documented on the homunculus. Go to the  Rheumatology activity and complete the homunculus joint exam.  Investigation: No additional findings.  Imaging: No results found.  Recent Labs: Lab Results  Component Value Date   WBC 7.5 09/28/2018   HGB 15.9 09/28/2018   PLT 276 09/28/2018   NA 140 09/28/2018   K 4.2 09/28/2018   CL 103 09/28/2018   CO2 26 09/28/2018   GLUCOSE 165 (H) 09/28/2018   BUN 12 09/28/2018   CREATININE 1.02 09/28/2018   BILITOT 0.4 09/28/2018   AST 40 (H) 09/28/2018   ALT 57 (H) 09/28/2018   PROT 6.8 09/28/2018   CALCIUM 9.8 09/28/2018   GFRAA 92 09/28/2018    Speciality Comments: PLQ Eye Exam 10/20/18 [facility] at [facility]  Procedures:  No procedures performed Allergies: Cymbalta [duloxetine hcl]   Assessment / Plan:     Visit Diagnoses: Positive ANA (antinuclear antibody) - [facility] labs: ANA 1:320 NS, RNP+, RF 23: He has no clinical features of autoimmune disease at this  time.  He tried taking PLQ for 5 weeks due to experiencing worsening myalgias and arthralgias.  He did not notice any improvement while taking Plaquenil.  His last dose was yesterday.  I had detailed discussion with patient that he does not have any clinical features of autoimmune disease.  We will discontinue Plaquenil and I would not start any medications at this time.  He was advised to notify us if he develops any new or worsening symptoms.  He will follow up on a PRN basis.  High risk medication use - d/c PLQ due to worsening myalgias and arthralgias. eye exam: 10/20/2018  Rheumatoid factor positive - RF 23: He has no active synovitis on exam.   Myofascial pain syndrome: He has generalized hyperalgesia on exam.  He continues to have generalized muscle aches and muscle tenderness.  He takes oxycodone 10 mg po for pain relief. He has chronic lower back pain and has had injections in the past, which did not provide pain relief.  Muscle pain is his main concern.  His muscle enzymes have been normal.  Patient states that he has  had neurological work-up in the past which was negative.  Idiopathic chronic gout of multiple sites without tophus -He has not had any recent gout flares.  He is taking Allopurinol 300 mg po daily.    Other fatigue: He has chronic fatigue.  He has difficulty sleeping at night due to the pain he experiences.    Other medical conditions are listed as follows:   History of hypercholesterolemia  History of obesity  History of anxiety  History of sleep apnea  History of vitamin D deficiency  Renal calcinosis  Personal history of colonic polyps   Orders: No orders of the defined types were placed in this encounter.  No orders of the defined types were placed in this encounter.     Follow-Up Instructions: Return if symptoms worsen or fail to improve, for Myofascial pain syndrome, Positive ANA.  Patient will contact us in case he develops any increased symptoms.  I offered referral to a tertiary care center.  He states he will contact his PCP for the referral.   Bo Merino, MD  Note - This record has been created using Editor, commissioning.  Chart creation errors have been sought, but may not always  have been located. Such creation errors do not reflect on  the standard of medical care.

## 2018-10-31 NOTE — Telephone Encounter (Signed)
Patient called stating he quit taking his Plaquenil last Friday, 10/28/18, but was nervous about stopping the medicine abruptly so took one pill yesterday and one pill today.  Patient has appointment with Dr. Estanislado Pandy tomorrow 11/01/18 at 3:00 to discuss "a new game plan."

## 2018-11-01 ENCOUNTER — Encounter: Payer: Self-pay | Admitting: Rheumatology

## 2018-11-01 ENCOUNTER — Ambulatory Visit: Payer: BC Managed Care – PPO | Admitting: Rheumatology

## 2018-11-01 VITALS — BP 142/81 | HR 75 | Resp 16 | Ht 73.0 in | Wt 380.0 lb

## 2018-11-01 DIAGNOSIS — Z79899 Other long term (current) drug therapy: Secondary | ICD-10-CM | POA: Diagnosis not present

## 2018-11-01 DIAGNOSIS — R768 Other specified abnormal immunological findings in serum: Secondary | ICD-10-CM

## 2018-11-01 DIAGNOSIS — M7918 Myalgia, other site: Secondary | ICD-10-CM | POA: Diagnosis not present

## 2018-11-01 DIAGNOSIS — N29 Other disorders of kidney and ureter in diseases classified elsewhere: Secondary | ICD-10-CM

## 2018-11-01 DIAGNOSIS — M1A09X0 Idiopathic chronic gout, multiple sites, without tophus (tophi): Secondary | ICD-10-CM

## 2018-11-01 DIAGNOSIS — Z8639 Personal history of other endocrine, nutritional and metabolic disease: Secondary | ICD-10-CM

## 2018-11-01 DIAGNOSIS — R5383 Other fatigue: Secondary | ICD-10-CM

## 2018-11-01 DIAGNOSIS — Z8669 Personal history of other diseases of the nervous system and sense organs: Secondary | ICD-10-CM

## 2018-11-01 DIAGNOSIS — Z860100 Personal history of colon polyps, unspecified: Secondary | ICD-10-CM

## 2018-11-01 DIAGNOSIS — Z8659 Personal history of other mental and behavioral disorders: Secondary | ICD-10-CM

## 2018-11-26 ENCOUNTER — Other Ambulatory Visit: Payer: Self-pay | Admitting: Neurosurgery

## 2018-11-26 ENCOUNTER — Other Ambulatory Visit: Payer: Self-pay | Admitting: Internal Medicine

## 2018-11-26 DIAGNOSIS — M545 Low back pain, unspecified: Secondary | ICD-10-CM

## 2018-11-26 DIAGNOSIS — M48062 Spinal stenosis, lumbar region with neurogenic claudication: Secondary | ICD-10-CM

## 2018-11-26 DIAGNOSIS — G8929 Other chronic pain: Secondary | ICD-10-CM

## 2018-12-04 ENCOUNTER — Ambulatory Visit
Admission: RE | Admit: 2018-12-04 | Discharge: 2018-12-04 | Disposition: A | Discharge status: Home / Self Care | Payer: BC Managed Care – PPO | Source: Ambulatory Visit | Attending: Internal Medicine | Admitting: Internal Medicine

## 2018-12-04 ENCOUNTER — Ambulatory Visit
Admission: RE | Admit: 2018-12-04 | Discharge: 2018-12-04 | Disposition: A | Discharge status: Home / Self Care | Payer: BC Managed Care – PPO | Source: Ambulatory Visit | Attending: Neurosurgery | Admitting: Neurosurgery

## 2018-12-04 DIAGNOSIS — G8929 Other chronic pain: Secondary | ICD-10-CM

## 2018-12-04 DIAGNOSIS — M545 Low back pain, unspecified: Secondary | ICD-10-CM

## 2018-12-04 DIAGNOSIS — M48062 Spinal stenosis, lumbar region with neurogenic claudication: Secondary | ICD-10-CM

## 2018-12-04 MED ORDER — GADOBENATE DIMEGLUMINE 529 MG/ML IV SOLN
20.0000 mL | Freq: Once | INTRAVENOUS | Status: AC | PRN
  Administered 2018-12-04: 20 mL via INTRAVENOUS

## 2018-12-16 ENCOUNTER — Other Ambulatory Visit: Payer: Self-pay | Admitting: Rheumatology

## 2018-12-16 NOTE — Telephone Encounter (Signed)
Patient advised will need to have Vitamin D rechecked before refill. Patient verbalized understanding.

## 2019-03-09 ENCOUNTER — Ambulatory Visit: Payer: BC Managed Care – PPO | Admitting: Podiatry

## 2019-03-09 ENCOUNTER — Other Ambulatory Visit: Payer: Self-pay | Admitting: Podiatry

## 2019-03-09 ENCOUNTER — Encounter: Payer: Self-pay | Admitting: Podiatry

## 2019-03-09 ENCOUNTER — Ambulatory Visit (INDEPENDENT_AMBULATORY_CARE_PROVIDER_SITE_OTHER): Payer: BC Managed Care – PPO

## 2019-03-09 ENCOUNTER — Other Ambulatory Visit: Payer: Self-pay

## 2019-03-09 VITALS — Temp 98.1°F

## 2019-03-09 DIAGNOSIS — M779 Enthesopathy, unspecified: Secondary | ICD-10-CM

## 2019-03-09 DIAGNOSIS — M79672 Pain in left foot: Secondary | ICD-10-CM | POA: Diagnosis not present

## 2019-03-09 DIAGNOSIS — M79671 Pain in right foot: Secondary | ICD-10-CM

## 2019-03-09 NOTE — Progress Notes (Signed)
Subjective:   Patient ID: Luke Larsen, male   DOB: 63 y.o.   MRN: [id number]   HPI Patient presents stating that his left leg is three quarters of an inch shorter than his right after having had a leg fracture in the eighth grade and states that he is having a lot of severe back pain and the feeling of this position is that orthotics could be of benefit to him.  Patient does not smoke is morbidly obese and is not active currently   Review of Systems  All other systems reviewed and are negative.       Objective:  Physical Exam Vitals signs and nursing note reviewed.  Constitutional:      Appearance: He is well-developed.  Pulmonary:     Effort: Pulmonary effort is normal.  Musculoskeletal: Normal range of motion.  Skin:    General: Skin is warm.  Neurological:     Mental Status: He is alert.     Neurovascular status intact muscle strength is found to be adequate range of motion within normal limits.  Patient is found to have severe limb length discrepancy with the left leg being between three quarters of an inch and 1 inch shorter than the right secondary to previous fracture of the growth plate in the eighth grade.  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  Patient had castings done today and we will add significant lift to the left in order to try to rebalance the patient  X-rays indicate moderate depression of the arch with spur formation plantar heel region bilateral

## 2019-03-09 NOTE — Progress Notes (Signed)
   Subjective:    Patient ID: Luke Larsen, male    DOB: Dec 07, 1955, 63 y.o.   MRN: [id number]  HPI    Review of Systems  All other systems reviewed and are negative.      Objective:   Physical Exam        Assessment & Plan:

## 2019-03-29 DIAGNOSIS — M4310 Spondylolisthesis, site unspecified: Secondary | ICD-10-CM | POA: Insufficient documentation

## 2019-03-29 DIAGNOSIS — G8929 Other chronic pain: Secondary | ICD-10-CM | POA: Insufficient documentation

## 2019-03-29 DIAGNOSIS — M47816 Spondylosis without myelopathy or radiculopathy, lumbar region: Secondary | ICD-10-CM | POA: Insufficient documentation

## 2019-03-30 ENCOUNTER — Other Ambulatory Visit: Payer: Self-pay

## 2019-03-30 ENCOUNTER — Ambulatory Visit: Payer: BC Managed Care – PPO | Admitting: Orthotics

## 2019-03-30 DIAGNOSIS — M79672 Pain in left foot: Secondary | ICD-10-CM

## 2019-03-30 DIAGNOSIS — M779 Enthesopathy, unspecified: Secondary | ICD-10-CM

## 2019-03-30 DIAGNOSIS — M79671 Pain in right foot: Secondary | ICD-10-CM

## 2019-03-30 NOTE — Progress Notes (Signed)
Patient came in today to pick up custom made foot orthotics.  The goals were accomplished and the patient reported no dissatisfaction with said orthotics.  Patient was advised of breakin period and how to report any issues. 

## 2019-05-30 ENCOUNTER — Ambulatory Visit
Admission: RE | Admit: 2019-05-30 | Discharge: 2019-05-30 | Disposition: A | Discharge status: Home / Self Care | Payer: BC Managed Care – PPO | Source: Ambulatory Visit | Attending: Pain Medicine | Admitting: Pain Medicine

## 2019-05-30 ENCOUNTER — Other Ambulatory Visit: Payer: Self-pay | Admitting: Pain Medicine

## 2019-05-30 ENCOUNTER — Other Ambulatory Visit: Payer: Self-pay

## 2019-05-30 DIAGNOSIS — M25551 Pain in right hip: Secondary | ICD-10-CM

## 2019-06-21 ENCOUNTER — Other Ambulatory Visit: Payer: Self-pay | Admitting: Pain Medicine

## 2019-06-21 DIAGNOSIS — M5136 Other intervertebral disc degeneration, lumbar region: Secondary | ICD-10-CM

## 2019-06-28 ENCOUNTER — Inpatient Hospital Stay: Admission: RE | Admit: 2019-06-28 | Payer: BC Managed Care – PPO | Source: Ambulatory Visit

## 2019-07-10 ENCOUNTER — Other Ambulatory Visit: Payer: Self-pay

## 2019-07-10 ENCOUNTER — Ambulatory Visit
Admission: RE | Admit: 2019-07-10 | Discharge: 2019-07-10 | Disposition: A | Discharge status: Home / Self Care | Payer: BC Managed Care – PPO | Source: Ambulatory Visit | Attending: Pain Medicine | Admitting: Pain Medicine

## 2019-07-10 DIAGNOSIS — M5136 Other intervertebral disc degeneration, lumbar region: Secondary | ICD-10-CM

## 2019-07-10 MED ORDER — METHYLPREDNISOLONE ACETATE 40 MG/ML INJ SUSP (RADIOLOG
120.0000 mg | Freq: Once | INTRAMUSCULAR | Status: AC
  Administered 2019-07-10: 120 mg via EPIDURAL

## 2019-07-10 MED ORDER — IOPAMIDOL (ISOVUE-M 200) INJECTION 41%
1.0000 mL | Freq: Once | INTRAMUSCULAR | Status: AC
  Administered 2019-07-10: 1 mL via EPIDURAL

## 2019-07-10 NOTE — Discharge Instructions (Signed)

## 2019-07-28 ENCOUNTER — Other Ambulatory Visit: Payer: Self-pay | Admitting: Neurosurgery

## 2019-07-28 DIAGNOSIS — M48062 Spinal stenosis, lumbar region with neurogenic claudication: Secondary | ICD-10-CM

## 2019-07-31 ENCOUNTER — Ambulatory Visit
Admission: RE | Admit: 2019-07-31 | Discharge: 2019-07-31 | Disposition: A | Discharge status: Home / Self Care | Payer: BC Managed Care – PPO | Source: Ambulatory Visit | Attending: Neurosurgery | Admitting: Neurosurgery

## 2019-07-31 DIAGNOSIS — M48062 Spinal stenosis, lumbar region with neurogenic claudication: Secondary | ICD-10-CM

## 2019-07-31 MED ORDER — GADOBENATE DIMEGLUMINE 529 MG/ML IV SOLN
20.0000 mL | Freq: Once | INTRAVENOUS | Status: AC | PRN
  Administered 2019-07-31: 20 mL via INTRAVENOUS

## 2019-11-11 ENCOUNTER — Other Ambulatory Visit: Payer: Self-pay | Admitting: Neurosurgery

## 2019-11-11 DIAGNOSIS — K5902 Outlet dysfunction constipation: Secondary | ICD-10-CM

## 2019-11-21 ENCOUNTER — Ambulatory Visit
Admission: RE | Admit: 2019-11-21 | Discharge: 2019-11-21 | Disposition: A | Discharge status: Home / Self Care | Payer: BC Managed Care – PPO | Source: Ambulatory Visit | Attending: Neurosurgery | Admitting: Neurosurgery

## 2019-11-21 DIAGNOSIS — K5902 Outlet dysfunction constipation: Secondary | ICD-10-CM

## 2019-11-21 MED ORDER — IOPAMIDOL (ISOVUE-300) INJECTION 61%
125.0000 mL | Freq: Once | INTRAVENOUS | Status: AC | PRN
  Administered 2019-11-21: 125 mL via INTRAVENOUS

## 2019-11-28 ENCOUNTER — Encounter (INDEPENDENT_AMBULATORY_CARE_PROVIDER_SITE_OTHER): Payer: Self-pay | Admitting: Family Medicine

## 2019-11-28 ENCOUNTER — Ambulatory Visit (INDEPENDENT_AMBULATORY_CARE_PROVIDER_SITE_OTHER): Payer: BC Managed Care – PPO | Admitting: Family Medicine

## 2019-11-28 ENCOUNTER — Other Ambulatory Visit: Payer: Self-pay

## 2019-11-28 VITALS — BP 151/79 | HR 70 | Temp 98.2°F | Ht 72.0 in | Wt 393.0 lb

## 2019-11-28 DIAGNOSIS — R0602 Shortness of breath: Secondary | ICD-10-CM | POA: Diagnosis not present

## 2019-11-28 DIAGNOSIS — Z0289 Encounter for other administrative examinations: Secondary | ICD-10-CM

## 2019-11-28 DIAGNOSIS — R5383 Other fatigue: Secondary | ICD-10-CM

## 2019-11-28 DIAGNOSIS — Z9189 Other specified personal risk factors, not elsewhere classified: Secondary | ICD-10-CM | POA: Diagnosis not present

## 2019-11-28 DIAGNOSIS — Z6841 Body Mass Index (BMI) 40.0 and over, adult: Secondary | ICD-10-CM

## 2019-11-28 DIAGNOSIS — I10 Essential (primary) hypertension: Secondary | ICD-10-CM

## 2019-11-28 DIAGNOSIS — R739 Hyperglycemia, unspecified: Secondary | ICD-10-CM

## 2019-11-28 DIAGNOSIS — R7989 Other specified abnormal findings of blood chemistry: Secondary | ICD-10-CM

## 2019-11-28 DIAGNOSIS — Z1331 Encounter for screening for depression: Secondary | ICD-10-CM | POA: Diagnosis not present

## 2019-11-28 DIAGNOSIS — E7849 Other hyperlipidemia: Secondary | ICD-10-CM

## 2019-11-28 DIAGNOSIS — E559 Vitamin D deficiency, unspecified: Secondary | ICD-10-CM

## 2019-11-28 NOTE — Progress Notes (Signed)
Dear Luke Stains, MD,   Thank you for referring Luke Larsen to our clinic. The following note includes my evaluation and treatment recommendations.  Chief Complaint:   OBESITY Luke Larsen (MR# [id number]) is a 64 y.o. male who presents for evaluation and treatment of obesity and related comorbidities. Current BMI is Body mass index is 53.3 kg/m. [name] has been struggling with his weight for many years and has been unsuccessful in either losing weight, maintaining weight loss, or reaching his healthy weight goal.  Breshawn eats out multiple times a day.  Adolph is currently in the action stage of change and ready to dedicate time achieving and maintaining a healthier weight. [name] is interested in becoming our patient and working on intensive lifestyle modifications including (but not limited to) diet and exercise for weight loss.  [name]'s habits were reviewed today and are as follows: His family eats meals together, he thinks his family will eat healthier with him, his desired weight loss is 143 lbs, he has been heavy most of his life, he started gaining weight at 25, his heaviest weight ever was 395 pounds, he is a picky eater and doesn't like to eat healthier foods, he has significant food cravings issues, he snacks frequently in the evenings, he is frequently drinking liquids with calories, he frequently makes poor food choices, he has problems with excessive hunger, he frequently eats larger portions than normal and he struggles with emotional eating.  Depression Screen Rilen's Food and Mood (modified PHQ-9) score was 12.  Depression screen PHQ 2/9 11/28/2019  Decreased Interest 1  Down, Depressed, Hopeless 1  PHQ - 2 Score 2  Altered sleeping 3  Tired, decreased energy 3  Change in appetite 2  Feeling bad or failure about yourself  1  Trouble concentrating 1  Moving slowly or fidgety/restless 0  Suicidal thoughts 0  PHQ-9 Score 12  Difficult doing work/chores Somewhat  difficult   Subjective:   1. Other fatigue [name] admits to daytime somnolence and admits to waking up still tired. Patent has a history of symptoms of daytime fatigue and morning headache. Luke Larsen generally gets 4 or 5 hours of sleep per night, and states that he has nightime awakenings. Snoring is present. Apneic episodes are not present. Epworth Sleepiness Score is 8.  2. Shortness of breath on exertion Luke Larsen notes increasing shortness of breath with exercising and seems to be worsening over time with weight gain. He notes getting out of breath sooner with activity than he used to. This has not gotten worse recently. Luke Larsen denies shortness of breath at rest or orthopnea.  3. Essential hypertension Mars's blood pressure is elevated today. He is on valsartan and denies chest pain.  4. Other hyperlipidemia Luke Larsen has no recent labs in [place]. He is not on statin and denies chest pain.  5. Vitamin D deficiency [name] has a history of Vit D deficiency. He is not on Vit D currently, and he notes fatigue.  6. Hyperglycemia Luke Larsen has a history of elevated glucose readings in the past. He has no recent A1c.  7. Elevated LFTs Luke Larsen has a history of elevated LFTs. He denies abdominal pain or jaundice.  8. At risk for heart disease Luke Larsen is at a higher than average risk for cardiovascular disease due to obesity. Reviewed: no chest pain on exertion, no dyspnea on exertion, and no swelling of ankles.  Assessment/Plan:   1. Other fatigue Luke Larsen does feel that his weight is causing his energy to be  lower than it should be. Fatigue may be related to obesity, depression or many other causes. Labs will be ordered, and in the meanwhile, Luke Larsen will focus on self care including making healthy food choices, increasing physical activity and focusing on stress reduction.  - EKG 12-Lead - Vitamin B12 - CBC with Differential/Platelet - Folate - T3 - T4, free - TSH  2. Shortness of breath on exertion Luke Larsen  does feel that he gets out of breath more easily that he used to when he exercises. Luke Larsen's shortness of breath appears to be obesity related and exercise induced. He has agreed to work on weight loss and gradually increase exercise to treat his exercise induced shortness of breath. We will continue to monitor closely.  - CBC with Differential/Platelet  3. Essential hypertension Luke Larsen will start his diet, and will work on healthy weight loss and exercise to improve blood pressure control. We will watch for signs of hypotension as he continues his lifestyle modifications. We will check labs today, and we will recheck his blood pressure in 2 weeks.  4. Other hyperlipidemia Cardiovascular risk and specific lipid/LDL goals reviewed. We discussed several lifestyle modifications today and Luke Larsen will continue to work on diet, exercise and weight loss efforts. We will check labs today. Orders and follow up as documented in patient record.  .   - Lipid Panel With LDL/HDL Ratio  5. Vitamin D deficiency Low Vitamin D level contributes to fatigue and are associated with obesity, breast, and colon cancer. We will check labs today. [name] will follow-up for routine testing of Vitamin D, at least 2-3 times per year to avoid over-replacement.  - VITAMIN D 25 Hydroxy (Vit-D Deficiency, Fractures)  6. Hyperglycemia Fasting labs will be obtained today and results with be discussed with Luke Larsen in 2 weeks at his follow up visit. In the meanwhile [name] was started on a lower simple carbohydrate diet and will work on weight loss efforts.  - Comprehensive metabolic panel - Hemoglobin A1c - Insulin, random  7. Elevated LFTs We discussed the likely diagnosis of non-alcoholic fatty liver disease today and how this condition is obesity related. [name] was educated the importance of weight loss. Luke Larsen agreed to start his diet, and to continue with his weight loss efforts with healthier diet and exercise as an essential  part of his treatment plan. We will check labs today.  - Lipid Panel With LDL/HDL Ratio  8. Depression screening Luke Larsen had a positive depression screening. Depression is commonly associated with obesity and often results in emotional eating behaviors. We will monitor this closely and work on CBT to help improve the non-hunger eating patterns. Referral to Psychology may be required if no improvement is seen as he continues in our clinic.  9. At risk for heart disease Bueford was given approximately 30 minutes of coronary artery disease prevention counseling today. He is 64 y.o. male and has risk factors for heart disease including obesity. We discussed intensive lifestyle modifications today with an emphasis on specific weight loss instructions and strategies.   Repetitive spaced learning was employed today to elicit superior memory formation and behavioral change.  10. Class 3 severe obesity with serious comorbidity and body mass index (BMI) of 50.0 to 59.9 in adult, unspecified obesity type (HCC) Chantry is currently in the action stage of change and his goal is to continue with weight loss efforts. I recommend Iori begin the structured treatment plan as follows:  He has agreed to the Category 4 Plan.  Exercise  goals: No exercise has been prescribed at this time.   Behavioral modification strategies: increasing lean protein intake, no skipping meals and meal planning and cooking strategies.  He was informed of the importance of frequent follow-up visits to maximize his success with intensive lifestyle modifications for his multiple health conditions. He was informed we would discuss his lab results at his next visit unless there is a critical issue that needs to be addressed sooner. Fadi agreed to keep his next visit at the agreed upon time to discuss these results.  Objective:   Blood pressure (!) 151/79, pulse 70, temperature 98.2 F (36.8 C), temperature source Oral, height 6' (1.829 m),  weight (!) 393 lb (178.3 kg), SpO2 95 %. Body mass index is 53.3 kg/m.  EKG: Normal sinus rhythm, rate 78 BPM.  Indirect Calorimeter completed today shows a VO2 of 443 and a REE of 3083.  His calculated basal metabolic rate is 123456 thus his basal metabolic rate is better than expected.  General: Cooperative, alert, well developed, in no acute distress. HEENT: Conjunctivae and lids unremarkable. Cardiovascular: Regular rhythm.  Lungs: Normal work of breathing. Neurologic: No focal deficits.   Lab Results  Component Value Date   CREATININE 1.02 09/28/2018   BUN 12 09/28/2018   NA 140 09/28/2018   K 4.2 09/28/2018   CL 103 09/28/2018   CO2 26 09/28/2018   Lab Results  Component Value Date   ALT 57 (H) 09/28/2018   AST 40 (H) 09/28/2018   BILITOT 0.4 09/28/2018   Lab Results  Component Value Date   HGBA1C 6.0 (H) 02/08/2013   No results found for: INSULIN Lab Results  Component Value Date   TSH 2.46 09/28/2018   No results found for: CHOL, HDL, LDLCALC, LDLDIRECT, TRIG, CHOLHDL Lab Results  Component Value Date   WBC 7.5 09/28/2018   HGB 15.9 09/28/2018   HCT 45.6 09/28/2018   MCV 91.2 09/28/2018   PLT 276 09/28/2018   No results found for: IRON, TIBC, FERRITIN  Attestation Statements:   Reviewed by clinician on day of visit: allergies, medications, problem list, medical history, surgical history, family history, social history, and previous encounter notes.   I, Trixie Dredge, am acting as transcriptionist for Dennard Nip, MD.  I have reviewed the above documentation for accuracy and completeness, and I agree with the above. - Dennard Nip, MD

## 2019-11-29 LAB — COMPREHENSIVE METABOLIC PANEL
ALT: 58 IU/L — ABNORMAL HIGH (ref 0–44)
AST: 52 IU/L — ABNORMAL HIGH (ref 0–40)
Albumin/Globulin Ratio: 1.9 (ref 1.2–2.2)
Albumin: 4.3 g/dL (ref 3.8–4.8)
Alkaline Phosphatase: 94 IU/L (ref 39–117)
BUN/Creatinine Ratio: 13 (ref 10–24)
BUN: 11 mg/dL (ref 8–27)
Bilirubin Total: 0.4 mg/dL (ref 0.0–1.2)
CO2: 23 mmol/L (ref 20–29)
Calcium: 9.2 mg/dL (ref 8.6–10.2)
Chloride: 101 mmol/L (ref 96–106)
Creatinine, Ser: 0.86 mg/dL (ref 0.76–1.27)
GFR calc Af Amer: 108 mL/min/{1.73_m2} (ref >59)
GFR calc non Af Amer: 94 mL/min/{1.73_m2} (ref >59)
Globulin, Total: 2.3 g/dL (ref 1.5–4.5)
Glucose: 133 mg/dL — ABNORMAL HIGH (ref 65–99)
Potassium: 4.5 mmol/L (ref 3.5–5.2)
Sodium: 138 mmol/L (ref 134–144)
Total Protein: 6.6 g/dL (ref 6.0–8.5)

## 2019-11-29 LAB — CBC WITH DIFFERENTIAL/PLATELET
Basophils Absolute: 0.1 10*3/uL (ref 0.0–0.2)
Basos: 1 %
EOS (ABSOLUTE): 0.2 10*3/uL (ref 0.0–0.4)
Eos: 2 %
Hematocrit: 44.9 % (ref 37.5–51.0)
Hemoglobin: 15.4 g/dL (ref 13.0–17.7)
Immature Grans (Abs): 0 10*3/uL (ref 0.0–0.1)
Immature Granulocytes: 1 %
Lymphocytes Absolute: 1.8 10*3/uL (ref 0.7–3.1)
Lymphs: 24 %
MCH: 31.6 pg (ref 26.6–33.0)
MCHC: 34.3 g/dL (ref 31.5–35.7)
MCV: 92 fL (ref 79–97)
Monocytes Absolute: 0.7 10*3/uL (ref 0.1–0.9)
Monocytes: 9 %
Neutrophils Absolute: 4.8 10*3/uL (ref 1.4–7.0)
Neutrophils: 63 %
Platelets: 203 10*3/uL (ref 150–450)
RBC: 4.88 x10E6/uL (ref 4.14–5.80)
RDW: 11.8 % (ref 11.6–15.4)
WBC: 7.6 10*3/uL (ref 3.4–10.8)

## 2019-11-29 LAB — HEMOGLOBIN A1C
Est. average glucose Bld gHb Est-mCnc: 163 mg/dL
Hgb A1c MFr Bld: 7.3 % — ABNORMAL HIGH (ref 4.8–5.6)

## 2019-11-29 LAB — TSH: TSH: 4.68 u[IU]/mL — ABNORMAL HIGH (ref 0.450–4.500)

## 2019-11-29 LAB — LIPID PANEL WITH LDL/HDL RATIO
Cholesterol, Total: 184 mg/dL (ref 100–199)
HDL: 41 mg/dL (ref >39)
LDL Chol Calc (NIH): 112 mg/dL — ABNORMAL HIGH (ref 0–99)
LDL/HDL Ratio: 2.7 ratio (ref 0.0–3.6)
Triglycerides: 174 mg/dL — ABNORMAL HIGH (ref 0–149)
VLDL Cholesterol Cal: 31 mg/dL (ref 5–40)

## 2019-11-29 LAB — VITAMIN D 25 HYDROXY (VIT D DEFICIENCY, FRACTURES): Vit D, 25-Hydroxy: 19.2 ng/mL — ABNORMAL LOW (ref 30.0–100.0)

## 2019-11-29 LAB — VITAMIN B12: Vitamin B-12: 384 pg/mL (ref 232–1245)

## 2019-11-29 LAB — FOLATE: Folate: 6.1 ng/mL (ref >3.0)

## 2019-11-29 LAB — T3: T3, Total: 124 ng/dL (ref 71–180)

## 2019-11-29 LAB — T4, FREE: Free T4: 1.01 ng/dL (ref 0.82–1.77)

## 2019-11-29 LAB — INSULIN, RANDOM: INSULIN: 44.6 u[IU]/mL — ABNORMAL HIGH (ref 2.6–24.9)

## 2019-12-12 ENCOUNTER — Ambulatory Visit (INDEPENDENT_AMBULATORY_CARE_PROVIDER_SITE_OTHER): Payer: BC Managed Care – PPO | Admitting: Family Medicine

## 2019-12-12 ENCOUNTER — Encounter (INDEPENDENT_AMBULATORY_CARE_PROVIDER_SITE_OTHER): Payer: Self-pay | Admitting: Family Medicine

## 2019-12-12 ENCOUNTER — Other Ambulatory Visit: Payer: Self-pay

## 2019-12-12 VITALS — BP 147/78 | HR 68 | Temp 97.8°F | Ht 72.0 in | Wt 375.0 lb

## 2019-12-12 DIAGNOSIS — E119 Type 2 diabetes mellitus without complications: Secondary | ICD-10-CM | POA: Diagnosis not present

## 2019-12-12 DIAGNOSIS — Z6841 Body Mass Index (BMI) 40.0 and over, adult: Secondary | ICD-10-CM

## 2019-12-12 DIAGNOSIS — E559 Vitamin D deficiency, unspecified: Secondary | ICD-10-CM | POA: Diagnosis not present

## 2019-12-12 DIAGNOSIS — R7989 Other specified abnormal findings of blood chemistry: Secondary | ICD-10-CM

## 2019-12-12 DIAGNOSIS — R739 Hyperglycemia, unspecified: Secondary | ICD-10-CM | POA: Diagnosis not present

## 2019-12-12 DIAGNOSIS — Z9189 Other specified personal risk factors, not elsewhere classified: Secondary | ICD-10-CM | POA: Diagnosis not present

## 2019-12-12 DIAGNOSIS — I10 Essential (primary) hypertension: Secondary | ICD-10-CM

## 2019-12-12 MED ORDER — BLOOD GLUCOSE MONITOR KIT: PACK | 0 refills | Status: AC

## 2019-12-12 MED ORDER — VITAMIN D (ERGOCALCIFEROL) 1.25 MG (50000 UNIT) PO CAPS: 50000.0000 [IU] | ORAL_CAPSULE | ORAL | 0 refills | Status: DC

## 2019-12-18 ENCOUNTER — Other Ambulatory Visit (INDEPENDENT_AMBULATORY_CARE_PROVIDER_SITE_OTHER): Payer: Self-pay | Admitting: Family Medicine

## 2019-12-18 DIAGNOSIS — E559 Vitamin D deficiency, unspecified: Secondary | ICD-10-CM

## 2019-12-20 NOTE — Progress Notes (Signed)
Chief Complaint:   OBESITY Luke Larsen is here to discuss his progress with his obesity treatment plan along with follow-up of his obesity related diagnoses. Luke Larsen is on the Category 4 Plan and states he is following his eating plan approximately 99% of the time. Luke Larsen states he is walking 3,000-4,000 steps at work.   Today's visit was #: 2 Starting weight: 393 lbs Starting date: 11/28/2019 Today's weight: 375 lbs Today's date: 12/12/2019 Total lbs lost to date: 18 Total lbs lost since last in-office visit: 18  Interim History: Luke Larsen has done very well with weight loss on his Category 4 plan. His hunger is controlled and he lost a lot more weight than expected, but about half of his weight loss was due to water weight loss. Yet still, he lost more fat than recommended and will need to adjust increase in his calorie intake.  Subjective:   1. Vitamin D deficiency Luke Larsen has a new diagnosis of Vit D deficiency. He is on Vit D OTC, but his level is not at goal. I discussed labs with the patient today.  2. Elevated LFTs Luke Larsen has mildly elevated LFTs. He denies abdominal pain or jaundice. 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 I discussed labs with the patient today.  3. Type 2 diabetes mellitus without complication, without long-term current use of insulin (HCC) Luke Larsen is working on diet, exercise, and weight loss. He denies hypoglycemia. I discussed labs with the patient today.  4. Essential hypertension Luke Larsen blood pressure is elevated today. He is attempting to improve with diet.  5. At risk for heart disease Luke Larsen is at a higher than average risk for cardiovascular disease due to obesity. Reviewed: no chest pain on exertion, no dyspnea on exertion, and no swelling of ankles.  Assessment/Plan:   1. Vitamin D deficiency Low Vitamin D level contributes to fatigue and are associated with obesity, breast, and colon cancer. Luke Larsen agreed to start prescription  Vitamin D 50,000 IU every week with no refills, and he will continue OTC Vit D every other day. He will follow-up for routine testing of Vitamin D, at least 2-3 times per year to avoid over-replacement.  - Vitamin D, Ergocalciferol, (DRISDOL) 1.25 MG (50000 UNIT) CAPS capsule; Take 1 capsule (50,000 Units total) by mouth every 7 (seven) days.  Dispense: 4 capsule; Refill: 0  2. Elevated LFTs We discussed the likely diagnosis of non-alcoholic fatty liver disease today and how this condition is obesity related. Luke Larsen was educated the importance of weight loss. Morgen agreed to continue with his weight loss efforts with healthier diet and exercise as an essential part of his treatment plan. We will recheck labs in 3 months.  3. Type 2 diabetes mellitus without complication, without long-term current use of insulin (HCC) Good blood sugar control is important to decrease the likelihood of diabetic complications such as nephropathy, neuropathy, limb loss, blindness, coronary artery disease, and death. Intensive lifestyle modification including diet, exercise and weight loss are the first line of treatment for diabetes. Luke Larsen will continue with diet and exercise, and we will refill check labs in 3 months. We will send glucose kit to the pharmacy.   - blood glucose meter kit and supplies KIT; Dispense based on patient and insurance preference. Use up to four times daily as directed. (FOR ICD-9 250.00, 250.01).  Dispense: 1 each; Refill: 0  4. Essential hypertension Luke Larsen is working on healthy weight loss, diet, and exercise to improve blood pressure control.  We will watch for signs of hypotension as he continues his lifestyle modifications. We will recheck his blood pressure in 2 weeks, may need to temporarily start antihypertensives if no improvement soon.  5. At risk for heart disease Luke Larsen was given approximately 30 minutes of coronary artery disease prevention counseling today. He is 64 y.o. male and has  risk factors for heart disease including obesity. We discussed intensive lifestyle modifications today with an emphasis on specific weight loss instructions and strategies.   Repetitive spaced learning was employed today to elicit superior memory formation and behavioral change.  6. Class 3 severe obesity with serious comorbidity and body mass index (BMI) of 50.0 to 59.9 in adult, unspecified obesity type (HCC) Luke Larsen is currently in the action stage of change. As such, his goal is to continue with weight loss efforts. He has agreed to the Category 4 Plan + 200 calories of fat (nonsaturated).  Exercise goals: Luke Larsen is to continue his current exercise regimen as is.  Behavioral modification strategies: increasing lean protein intake.  Luke Larsen has agreed to follow-up with our clinic in 2 weeks. He was informed of the importance of frequent follow-up visits to maximize his success with intensive lifestyle modifications for his multiple health conditions.   Objective:   Blood pressure (!) 147/78, pulse 68, temperature 97.8 F (36.6 C), temperature source Oral, height 6' (1.829 m), weight (!) 375 lb (170.1 kg), SpO2 97 %. Body mass index is 50.86 kg/m.  General: Cooperative, alert, well developed, in no acute distress. HEENT: Conjunctivae and lids unremarkable. Cardiovascular: Regular rhythm.  Lungs: Normal work of breathing. Neurologic: No focal deficits.   Lab Results  Component Value Date   CREATININE 0.86 11/28/2019   BUN 11 11/28/2019   NA 138 11/28/2019   K 4.5 11/28/2019   CL 101 11/28/2019   CO2 23 11/28/2019   Lab Results  Component Value Date   ALT 58 (H) 11/28/2019   AST 52 (H) 11/28/2019   ALKPHOS 94 11/28/2019   BILITOT 0.4 11/28/2019   Lab Results  Component Value Date   HGBA1C 7.3 (H) 11/28/2019   HGBA1C 6.0 (H) 02/08/2013   Lab Results  Component Value Date   INSULIN 44.6 (H) 11/28/2019   Lab Results  Component Value Date   TSH 4.680 (H) 11/28/2019   Lab  Results  Component Value Date   CHOL 184 11/28/2019   HDL 41 11/28/2019   LDLCALC 112 (H) 11/28/2019   TRIG 174 (H) 11/28/2019   Lab Results  Component Value Date   WBC 7.6 11/28/2019   HGB 15.4 11/28/2019   HCT 44.9 11/28/2019   MCV 92 11/28/2019   PLT 203 11/28/2019   No results found for: IRON, TIBC, FERRITIN   Attestation Statements:   Reviewed by clinician on day of visit: allergies, medications, problem list, medical history, surgical history, family history, social history, and previous encounter notes.   I, Trixie Dredge, am acting as transcriptionist for Dennard Nip, MD.  I have reviewed the above documentation for accuracy and completeness, and I agree with the above. -  Dennard Nip, MD

## 2019-12-25 ENCOUNTER — Ambulatory Visit (INDEPENDENT_AMBULATORY_CARE_PROVIDER_SITE_OTHER): Payer: BC Managed Care – PPO | Admitting: Physician Assistant

## 2019-12-25 ENCOUNTER — Encounter (INDEPENDENT_AMBULATORY_CARE_PROVIDER_SITE_OTHER): Payer: Self-pay | Admitting: Physician Assistant

## 2019-12-25 ENCOUNTER — Other Ambulatory Visit: Payer: Self-pay

## 2019-12-25 VITALS — BP 167/68 | HR 67 | Temp 97.7°F | Ht 72.0 in | Wt 370.0 lb

## 2019-12-25 DIAGNOSIS — K5909 Other constipation: Secondary | ICD-10-CM

## 2019-12-25 DIAGNOSIS — Z6841 Body Mass Index (BMI) 40.0 and over, adult: Secondary | ICD-10-CM

## 2019-12-25 DIAGNOSIS — E119 Type 2 diabetes mellitus without complications: Secondary | ICD-10-CM

## 2019-12-25 DIAGNOSIS — Z9189 Other specified personal risk factors, not elsewhere classified: Secondary | ICD-10-CM

## 2019-12-25 NOTE — Progress Notes (Signed)
Chief Complaint:   OBESITY Luke Larsen is here to discuss his progress with his obesity treatment plan along with follow-up of his obesity related diagnoses. Luke Larsen is on the Category 4 Plan + 200 calories and states he is following his eating plan approximately 95% of the time. Luke Larsen states he is exercising 0 minutes 0 times per week.  Today's visit was #: 3 Starting weight: 393 lbs Starting date: 11/28/2019 Today's weight: 370 lbs Today's date: 12/25/2019 Total lbs lost to date: 23 Total lbs lost since last in-office visit: 5  Interim History: Luke Larsen did very well with weight loss. He is sometimes not getting all of his snack calories in daily. He is asking about substitutions for bread and cheese.  Subjective:   Other constipation. Luke Larsen notes increased constipation since starting the plan. BM's are "uncomfortable." He is having a BM about every 3 days. He denies blood in stool or black stools. Last colonoscopy 3 years ago.   Type 2 diabetes mellitus without complication, without long-term current use of insulin ([facility]). Luke Larsen has checked his blood sugars twice around and each time were between 100 and 105. No hypoglycemia. No medications. No polyphagia.  Lab Results  Component Value Date   HGBA1C 7.3 (H) 11/28/2019   HGBA1C 6.0 (H) 02/08/2013   Lab Results  Component Value Date   LDLCALC 112 (H) 11/28/2019   CREATININE 0.86 11/28/2019   Lab Results  Component Value Date   INSULIN 44.6 (H) 11/28/2019   At risk for heart disease. Luke Larsen is at a higher than average risk for cardiovascular disease due to obesity. Reviewed: no chest pain on exertion, no dyspnea on exertion, and no swelling of ankles.  Assessment/Plan:   Other constipation. Luke Larsen was informed that a decrease in bowel movement frequency is normal while losing weight, but stools should not be hard or painful. Orders and follow up as documented in patient record. Luke Larsen will start Metamucil OTC daily and use  MiraLax PRN. He will increase his intake of water and vegetables.  Counseling Getting to Good Bowel Health: Your goal is to have one soft bowel movement each day. Drink at least 8 glasses of water each day. Eat plenty of fiber (goal is over 25 grams each day). It is best to get most of your fiber from dietary sources which includes leafy green vegetables, fresh fruit, and whole grains. You may need to add fiber with the help of OTC fiber supplements. These include Metamucil, Citrucel, and Flaxseed. If you are still having trouble, try adding Miralax or Magnesium Citrate. If all of these changes do not work, Luke Larsen.  Type 2 diabetes mellitus without complication, without long-term current use of insulin (Luke Larsen). Good blood sugar control is important to decrease the likelihood of diabetic complications such as nephropathy, neuropathy, limb loss, blindness, coronary artery disease, and death. Intensive lifestyle modification including diet, exercise and weight loss are the first line of treatment for diabetes.   At risk for heart disease. Luke Larsen was given approximately 15 minutes of coronary artery disease prevention counseling today. He is 64 y.o. male and has risk factors for heart disease including obesity. We discussed intensive lifestyle modifications today with an emphasis on specific weight loss instructions and strategies.   Repetitive spaced learning was employed today to elicit superior memory formation and behavioral change.  Class 3 severe obesity with serious comorbidity and body mass index (BMI) of 50.0 to 59.9 in adult, unspecified obesity type (Luke Larsen).  Luke Larsen is  currently in the action stage of change. As such, his goal is to continue with weight loss efforts. He has agreed to the Category 4 Plan + 200 fat calories.   Exercise goals: For substantial health benefits, adults should do at least 150 minutes (2 hours and 30 minutes) a week of moderate-intensity, or 75 minutes (1  hour and 15 minutes) a week of vigorous-intensity aerobic physical activity, or an equivalent combination of moderate- and vigorous-intensity aerobic activity. Aerobic activity should be performed in episodes of at least 10 minutes, and preferably, it should be spread throughout the week.  Behavioral modification strategies: meal planning and cooking strategies and planning for success.  Luke Larsen has agreed to follow-up with our clinic in 2 weeks. He was informed of the importance of frequent follow-up visits to maximize his success with intensive lifestyle modifications for his multiple health conditions.   Objective:   Blood pressure (!) 167/68, pulse 67, temperature 97.7 F (36.5 C), temperature source Oral, height 6' (1.829 m), weight (!) 370 lb (167.8 kg), SpO2 95 %. Body mass index is 50.18 kg/m.  General: Cooperative, alert, well developed, in no acute distress. HEENT: Conjunctivae and lids unremarkable. Cardiovascular: Regular rhythm.  Lungs: Normal work of breathing. Neurologic: No focal deficits.   Lab Results  Component Value Date   CREATININE 0.86 11/28/2019   BUN 11 11/28/2019   NA 138 11/28/2019   K 4.5 11/28/2019   CL 101 11/28/2019   CO2 23 11/28/2019   Lab Results  Component Value Date   ALT 58 (H) 11/28/2019   AST 52 (H) 11/28/2019   ALKPHOS 94 11/28/2019   BILITOT 0.4 11/28/2019   Lab Results  Component Value Date   HGBA1C 7.3 (H) 11/28/2019   HGBA1C 6.0 (H) 02/08/2013   Lab Results  Component Value Date   INSULIN 44.6 (H) 11/28/2019   Lab Results  Component Value Date   TSH 4.680 (H) 11/28/2019   Lab Results  Component Value Date   CHOL 184 11/28/2019   HDL 41 11/28/2019   LDLCALC 112 (H) 11/28/2019   TRIG 174 (H) 11/28/2019   Lab Results  Component Value Date   WBC 7.6 11/28/2019   HGB 15.4 11/28/2019   HCT 44.9 11/28/2019   MCV 92 11/28/2019   PLT 203 11/28/2019   No results found for: IRON, TIBC, FERRITIN  Attestation Statements:    Reviewed by clinician on day of visit: allergies, medications, problem list, medical history, surgical history, family history, social history, and previous encounter notes.  IMichaelene Song, am acting as transcriptionist for Abby Potash, PA-C   I have reviewed the above documentation for accuracy and completeness, and I agree with the above. Abby Potash, PA-C

## 2019-12-29 ENCOUNTER — Other Ambulatory Visit (INDEPENDENT_AMBULATORY_CARE_PROVIDER_SITE_OTHER): Payer: Self-pay | Admitting: Family Medicine

## 2020-01-03 ENCOUNTER — Other Ambulatory Visit (INDEPENDENT_AMBULATORY_CARE_PROVIDER_SITE_OTHER): Payer: Self-pay | Admitting: Family Medicine

## 2020-01-03 DIAGNOSIS — E559 Vitamin D deficiency, unspecified: Secondary | ICD-10-CM

## 2020-01-09 ENCOUNTER — Encounter (INDEPENDENT_AMBULATORY_CARE_PROVIDER_SITE_OTHER): Payer: Self-pay | Admitting: Physician Assistant

## 2020-01-09 ENCOUNTER — Other Ambulatory Visit: Payer: Self-pay

## 2020-01-09 ENCOUNTER — Ambulatory Visit (INDEPENDENT_AMBULATORY_CARE_PROVIDER_SITE_OTHER): Payer: BC Managed Care – PPO | Admitting: Physician Assistant

## 2020-01-09 VITALS — BP 143/79 | HR 69 | Temp 98.3°F | Ht 72.0 in | Wt 355.0 lb

## 2020-01-09 DIAGNOSIS — Z9189 Other specified personal risk factors, not elsewhere classified: Secondary | ICD-10-CM

## 2020-01-09 DIAGNOSIS — E119 Type 2 diabetes mellitus without complications: Secondary | ICD-10-CM | POA: Diagnosis not present

## 2020-01-09 DIAGNOSIS — E559 Vitamin D deficiency, unspecified: Secondary | ICD-10-CM | POA: Diagnosis not present

## 2020-01-09 DIAGNOSIS — Z6841 Body Mass Index (BMI) 40.0 and over, adult: Secondary | ICD-10-CM

## 2020-01-09 MED ORDER — VITAMIN D (ERGOCALCIFEROL) 1.25 MG (50000 UNIT) PO CAPS: 50000.0000 [IU] | ORAL_CAPSULE | ORAL | 0 refills | Status: DC

## 2020-01-09 NOTE — Progress Notes (Signed)
Chief Complaint:   OBESITY Luke Larsen is here to discuss his progress with his obesity treatment plan along with follow-up of his obesity related diagnoses. Dequavion is on the Category 4 Plan +200 calories and states he is following his eating plan approximately 90-95% of the time. Avery states he is exercising for 0 minutes 0 times per week.  Today's visit was #: 4 Starting weight: 393 lbs Starting date: 11/28/2019 Today's weight: 355 lbs Today's date: 01/09/2020 Total lbs lost to date: 38 lbs Total lbs lost since last in-office visit: 15 lbs  Interim History: diana gershon that he is following the plan well, though he is slightly bored.  He is asking about a little more variety with his dinner and how to incorporate [employer].  Subjective:   1. Vitamin D deficiency Emett's Vitamin D level was 19.2 on 11/28/2019. He is currently taking vit D. He denies nausea, vomiting or muscle weakness.  Last vitamin D level was not at goal.  2. Type 2 diabetes mellitus without complication, without long-term current use of insulin (HCC) Dominyc is not on any medications for diabetes.  He is checking fasting blood sugars a few times a week and averaging 100-120.  Lab Results  Component Value Date   HGBA1C 7.3 (H) 11/28/2019   HGBA1C 6.0 (H) 02/08/2013   Lab Results  Component Value Date   LDLCALC 112 (H) 11/28/2019   CREATININE 0.86 11/28/2019   Lab Results  Component Value Date   INSULIN 44.6 (H) 11/28/2019   3. At risk for osteoporosis Melburn is at higher risk of osteopenia and osteoporosis due to Vitamin D deficiency.   Assessment/Plan:   1. Vitamin D deficiency Low Vitamin D level contributes to fatigue and are associated with obesity, breast, and colon cancer. He agrees to continue to take prescription Vitamin D @50 ,000 IU every week and will follow-up for routine testing of Vitamin D, at least 2-3 times per year to avoid over-replacement. - Vitamin D, Ergocalciferol, (DRISDOL) 1.25 MG  (50000 UNIT) CAPS capsule; Take 1 capsule (50,000 Units total) by mouth every 7 (seven) days.  Dispense: 4 capsule; Refill: 0  2. Type 2 diabetes mellitus without complication, without long-term current use of insulin (HCC) Good blood sugar control is important to decrease the likelihood of diabetic complications such as nephropathy, neuropathy, limb loss, blindness, coronary artery disease, and death. Intensive lifestyle modification including diet, exercise and weight loss are the first line of treatment for diabetes.   3. At risk for osteoporosis Mollie was given approximately 15 minutes of osteoporosis prevention counseling today. Chibuikem is at risk for osteopenia and osteoporosis due to his Vitamin D deficiency. He was encouraged to take his Vitamin D and follow his higher calcium diet and increase strengthening exercise to help strengthen his bones and decrease his risk of osteopenia and osteoporosis.  Repetitive spaced learning was employed today to elicit superior memory formation and behavioral change.  4. Class 3 severe obesity with serious comorbidity and body mass index (BMI) of 45.0 to 49.9 in adult, unspecified obesity type (HCC) Amiliano is currently in the action stage of change. As such, his goal is to continue with weight loss efforts. He has agreed to the Category 4 Plan +200 calories.   Exercise goals: All adults should avoid inactivity. Some physical activity is better than none, and adults who participate in any amount of physical activity gain some health benefits.  Behavioral modification strategies: meal planning and cooking strategies and keeping healthy foods in  the home.  Oden has agreed to follow-up with our clinic in 2 weeks. He was informed of the importance of frequent follow-up visits to maximize his success with intensive lifestyle modifications for his multiple health conditions.   Objective:   Blood pressure (!) 143/79, pulse 69, temperature 98.3 F (36.8 C),  temperature source Oral, height 6' (1.829 m), weight (!) 355 lb (161 kg), SpO2 95 %. Body mass index is 48.15 kg/m.  General: Cooperative, alert, well developed, in no acute distress. HEENT: Conjunctivae and lids unremarkable. Cardiovascular: Regular rhythm.  Lungs: Normal work of breathing. Neurologic: No focal deficits.   Lab Results  Component Value Date   CREATININE 0.86 11/28/2019   BUN 11 11/28/2019   NA 138 11/28/2019   K 4.5 11/28/2019   CL 101 11/28/2019   CO2 23 11/28/2019   Lab Results  Component Value Date   ALT 58 (H) 11/28/2019   AST 52 (H) 11/28/2019   ALKPHOS 94 11/28/2019   BILITOT 0.4 11/28/2019   Lab Results  Component Value Date   HGBA1C 7.3 (H) 11/28/2019   HGBA1C 6.0 (H) 02/08/2013   Lab Results  Component Value Date   INSULIN 44.6 (H) 11/28/2019   Lab Results  Component Value Date   TSH 4.680 (H) 11/28/2019   Lab Results  Component Value Date   CHOL 184 11/28/2019   HDL 41 11/28/2019   LDLCALC 112 (H) 11/28/2019   TRIG 174 (H) 11/28/2019   Lab Results  Component Value Date   WBC 7.6 11/28/2019   HGB 15.4 11/28/2019   HCT 44.9 11/28/2019   MCV 92 11/28/2019   PLT 203 11/28/2019   Attestation Statements:   Reviewed by clinician on day of visit: allergies, medications, problem list, medical history, surgical history, family history, social history, and previous encounter notes.  I, Water quality scientist, CMA, am acting as Location manager for [employer], PA-C.  I have reviewed the above documentation for accuracy and completeness, and I agree with the above. Abby Potash, PA-C

## 2020-01-25 ENCOUNTER — Ambulatory Visit (INDEPENDENT_AMBULATORY_CARE_PROVIDER_SITE_OTHER): Payer: BC Managed Care – PPO | Admitting: Physician Assistant

## 2020-01-25 ENCOUNTER — Other Ambulatory Visit: Payer: Self-pay

## 2020-01-25 ENCOUNTER — Encounter (INDEPENDENT_AMBULATORY_CARE_PROVIDER_SITE_OTHER): Payer: Self-pay | Admitting: Physician Assistant

## 2020-01-25 VITALS — BP 116/72 | HR 61 | Temp 98.1°F | Ht 72.0 in | Wt 361.0 lb

## 2020-01-25 DIAGNOSIS — I10 Essential (primary) hypertension: Secondary | ICD-10-CM | POA: Diagnosis not present

## 2020-01-25 DIAGNOSIS — Z6841 Body Mass Index (BMI) 40.0 and over, adult: Secondary | ICD-10-CM

## 2020-01-25 NOTE — Progress Notes (Signed)
Chief Complaint:   OBESITY Luke Larsen is here to discuss his progress with his obesity treatment plan along with follow-up of his obesity related diagnoses. Luke Larsen is on the Category 4 Plan + 200 calories and states he is following his eating plan approximately 60% of the time. Luke Larsen states he is walking/yard work 20+ hours 7 times per week.  Today's visit was #: 5 Starting weight: 393 lbs Starting date: 11/28/2019 Today's weight: 361 lbs Today's date: 01/25/2020 Total lbs lost to date: 32 Total lbs lost since last in-office visit: 0  Interim History: Luke Larsen states that his daughter and granddaughter were in town for 10 days and he did not eat completely on plan in that time. He is ready to get back on track.  Subjective:   Essential hypertension. Drayson is on valsartan. No chest pain. Blood pressure normal.   BP Readings from Last 3 Encounters:  01/25/20 116/72  01/09/20 (!) 143/79  12/25/19 (!) 167/68   Lab Results  Component Value Date   CREATININE 0.86 11/28/2019   CREATININE 1.02 09/28/2018   CREATININE 0.91 02/18/2013   Assessment/Plan:   Essential hypertension. Leanard is working on healthy weight loss and exercise to improve blood pressure control. We will watch for signs of hypotension as he continues his lifestyle modifications. He will continue his medication as directed.  Class 3 severe obesity with serious comorbidity and body mass index (BMI) of 45.0 to 49.9 in adult, unspecified obesity type ([place]).  Luke Larsen is currently in the action stage of change. As such, his goal is to continue with weight loss efforts. He has agreed to the Category 4 Plan + 200 calories.   Exercise goals: For substantial health benefits, adults should do at least 150 minutes (2 hours and 30 minutes) a week of moderate-intensity, or 75 minutes (1 hour and 15 minutes) a week of vigorous-intensity aerobic physical activity, or an equivalent combination of moderate- and vigorous-intensity  aerobic activity. Aerobic activity should be performed in episodes of at least 10 minutes, and preferably, it should be spread throughout the week.  Behavioral modification strategies: meal planning and cooking strategies and planning for success.  [name] has agreed to follow-up with our clinic in 2 weeks. He was informed of the importance of frequent follow-up visits to maximize his success with intensive lifestyle modifications for his multiple health conditions.   Objective:   Blood pressure 116/72, pulse 61, temperature 98.1 F (36.7 C), temperature source Oral, height 6' (1.829 m), weight (!) 361 lb (163.7 kg), SpO2 95 %. Body mass index is 48.96 kg/m.  General: Cooperative, alert, well developed, in no acute distress. HEENT: Conjunctivae and lids unremarkable. Cardiovascular: Regular rhythm.  Lungs: Normal work of breathing. Neurologic: No focal deficits.   Lab Results  Component Value Date   CREATININE 0.86 11/28/2019   BUN 11 11/28/2019   NA 138 11/28/2019   K 4.5 11/28/2019   CL 101 11/28/2019   CO2 23 11/28/2019   Lab Results  Component Value Date   ALT 58 (H) 11/28/2019   AST 52 (H) 11/28/2019   ALKPHOS 94 11/28/2019   BILITOT 0.4 11/28/2019   Lab Results  Component Value Date   HGBA1C 7.3 (H) 11/28/2019   HGBA1C 6.0 (H) 02/08/2013   Lab Results  Component Value Date   INSULIN 44.6 (H) 11/28/2019   Lab Results  Component Value Date   TSH 4.680 (H) 11/28/2019   Lab Results  Component Value Date   CHOL 184 11/28/2019  HDL 41 11/28/2019   LDLCALC 112 (H) 11/28/2019   TRIG 174 (H) 11/28/2019   Lab Results  Component Value Date   WBC 7.6 11/28/2019   HGB 15.4 11/28/2019   HCT 44.9 11/28/2019   MCV 92 11/28/2019   PLT 203 11/28/2019   No results found for: IRON, TIBC, FERRITIN  Attestation Statements:   Reviewed by clinician on day of visit: allergies, medications, problem list, medical history, surgical history, family history, social history,  and previous encounter notes.  Time spent on visit including pre-visit chart review and post-visit charting and care was 31 minutes.   IMichaelene Song, am acting as transcriptionist for Abby Potash, PA-C   I have reviewed the above documentation for accuracy and completeness, and I agree with the above. Abby Potash, PA-C

## 2020-01-31 ENCOUNTER — Other Ambulatory Visit (INDEPENDENT_AMBULATORY_CARE_PROVIDER_SITE_OTHER): Payer: Self-pay | Admitting: Physician Assistant

## 2020-01-31 DIAGNOSIS — E559 Vitamin D deficiency, unspecified: Secondary | ICD-10-CM

## 2020-02-12 ENCOUNTER — Ambulatory Visit (INDEPENDENT_AMBULATORY_CARE_PROVIDER_SITE_OTHER): Payer: BC Managed Care – PPO | Admitting: Physician Assistant

## 2020-02-12 ENCOUNTER — Other Ambulatory Visit: Payer: Self-pay

## 2020-02-12 ENCOUNTER — Encounter (INDEPENDENT_AMBULATORY_CARE_PROVIDER_SITE_OTHER): Payer: Self-pay | Admitting: Physician Assistant

## 2020-02-12 VITALS — BP 129/77 | HR 66 | Temp 97.9°F | Ht 72.0 in | Wt 356.0 lb

## 2020-02-12 DIAGNOSIS — E559 Vitamin D deficiency, unspecified: Secondary | ICD-10-CM

## 2020-02-12 DIAGNOSIS — Z6841 Body Mass Index (BMI) 40.0 and over, adult: Secondary | ICD-10-CM

## 2020-02-12 DIAGNOSIS — Z9189 Other specified personal risk factors, not elsewhere classified: Secondary | ICD-10-CM | POA: Diagnosis not present

## 2020-02-12 DIAGNOSIS — I10 Essential (primary) hypertension: Secondary | ICD-10-CM | POA: Diagnosis not present

## 2020-02-12 MED ORDER — VITAMIN D (ERGOCALCIFEROL) 1.25 MG (50000 UNIT) PO CAPS: 50000.0000 [IU] | ORAL_CAPSULE | ORAL | 0 refills | Status: DC

## 2020-02-13 NOTE — Progress Notes (Signed)
Chief Complaint:   OBESITY Luke Larsen is here to discuss his progress with his obesity treatment plan along with follow-up of his obesity related diagnoses. Luke Larsen is on the Category 4 Plan + 100 calories and states he is following his eating plan approximately 90% of the time. Luke Larsen states he is walking 20 minutes 4 times per week.  Today's visit was #: 6 Starting weight: 393 lbs Starting date: 11/28/2019 Today's weight: 356 lbs Today's date: 02/12/2020 Total lbs lost to date: 37 Total lbs lost since last in-office visit: 5  Interim History: Luke Larsen is disappointed with his weight loss today as he reports that he was down to 346 on his scale last week. He notes some constipation today since restarting his pain medication for his back.  Subjective:   Vitamin D deficiency. No nausea, vomiting, or muscle weakness on prescription Vitamin D. Last Vitamin D level 19.2 on 11/28/2019.  Essential hypertension. Luke Larsen is on Diovan.  No chest pain. No headache. Blood pressure is controlled today.  BP Readings from Last 3 Encounters:  02/12/20 129/77  01/25/20 116/72  01/09/20 (!) 143/79   Lab Results  Component Value Date   CREATININE 0.86 11/28/2019   CREATININE 1.02 09/28/2018   CREATININE 0.91 02/18/2013   At risk for heart disease. Luke Larsen is at a higher than average risk for cardiovascular disease due to obesity.   Assessment/Plan:   Vitamin D deficiency. Low Vitamin D level contributes to fatigue and are associated with obesity, breast, and colon cancer. He was given a refill on his Vitamin D, Ergocalciferol, (DRISDOL) 1.25 MG (50000 UNIT) CAPS capsule every week #4 with 0 refills and will follow-up for routine testing of Vitamin D, at least 2-3 times per year to avoid over-replacement.     Essential hypertension. Luke Larsen is working on healthy weight loss and exercise to improve blood pressure control. We will watch for signs of hypotension as he continues his lifestyle  modifications. He will continue his medication as directed.  At risk for heart disease. Luke Larsen was given approximately 15 minutes of coronary artery disease prevention counseling today. He is 64 y.o. male and has risk factors for heart disease including obesity. We discussed intensive lifestyle modifications today with an emphasis on specific weight loss instructions and strategies.   Repetitive spaced learning was employed today to elicit superior memory formation and behavioral change.  Class 3 severe obesity with serious comorbidity and body mass index (BMI) of 45.0 to 49.9 in adult, unspecified obesity type (Luke Larsen).  Luke Larsen is currently in the action stage of change. As such, his goal is to continue with weight loss efforts. He has agreed to the Category 4 Plan + 100 calories.   Exercise goals: For substantial health benefits, adults should do at least 150 minutes (2 hours and 30 minutes) a week of moderate-intensity, or 75 minutes (1 hour and 15 minutes) a week of vigorous-intensity aerobic physical activity, or an equivalent combination of moderate- and vigorous-intensity aerobic activity. Aerobic activity should be performed in episodes of at least 10 minutes, and preferably, it should be spread throughout the week.  Behavioral modification strategies: keeping healthy foods in the home and planning for success.  Luke Larsen has agreed to follow-up with our clinic in 2 weeks. He was informed of the importance of frequent follow-up visits to maximize his success with intensive lifestyle modifications for his multiple health conditions.   Objective:   Blood pressure 129/77, pulse 66, temperature 97.9 F (36.6 C), temperature source  Oral, height 6' (1.829 m), weight (!) 356 lb (161.5 kg), SpO2 95 %. Body mass index is 48.28 kg/m.  General: Cooperative, alert, well developed, in no acute distress. HEENT: Conjunctivae and lids unremarkable. Cardiovascular: Regular rhythm.  Lungs: Normal work of  breathing. Neurologic: No focal deficits.   Lab Results  Component Value Date   CREATININE 0.86 11/28/2019   BUN 11 11/28/2019   NA 138 11/28/2019   K 4.5 11/28/2019   CL 101 11/28/2019   CO2 23 11/28/2019   Lab Results  Component Value Date   ALT 58 (H) 11/28/2019   AST 52 (H) 11/28/2019   ALKPHOS 94 11/28/2019   BILITOT 0.4 11/28/2019   Lab Results  Component Value Date   HGBA1C 7.3 (H) 11/28/2019   HGBA1C 6.0 (H) 02/08/2013   Lab Results  Component Value Date   INSULIN 44.6 (H) 11/28/2019   Lab Results  Component Value Date   TSH 4.680 (H) 11/28/2019   Lab Results  Component Value Date   CHOL 184 11/28/2019   HDL 41 11/28/2019   LDLCALC 112 (H) 11/28/2019   TRIG 174 (H) 11/28/2019   Lab Results  Component Value Date   WBC 7.6 11/28/2019   HGB 15.4 11/28/2019   HCT 44.9 11/28/2019   MCV 92 11/28/2019   PLT 203 11/28/2019   No results found for: IRON, TIBC, FERRITIN  Attestation Statements:   Reviewed by clinician on day of visit: allergies, medications, problem list, medical history, surgical history, family history, social history, and previous encounter notes.  IMichaelene Song, am acting as transcriptionist for Abby Potash, PA-C   I have reviewed the above documentation for accuracy and completeness, and I agree with the above. Abby Potash, PA-C

## 2020-03-03 ENCOUNTER — Other Ambulatory Visit (INDEPENDENT_AMBULATORY_CARE_PROVIDER_SITE_OTHER): Payer: Self-pay | Admitting: Physician Assistant

## 2020-03-03 DIAGNOSIS — E559 Vitamin D deficiency, unspecified: Secondary | ICD-10-CM

## 2020-03-05 ENCOUNTER — Other Ambulatory Visit: Payer: Self-pay

## 2020-03-05 ENCOUNTER — Ambulatory Visit (INDEPENDENT_AMBULATORY_CARE_PROVIDER_SITE_OTHER): Payer: BC Managed Care – PPO | Admitting: Physician Assistant

## 2020-03-05 ENCOUNTER — Encounter (INDEPENDENT_AMBULATORY_CARE_PROVIDER_SITE_OTHER): Payer: Self-pay | Admitting: Physician Assistant

## 2020-03-05 VITALS — BP 137/76 | HR 69 | Temp 98.0°F | Ht 72.0 in | Wt 345.0 lb

## 2020-03-05 DIAGNOSIS — Z9189 Other specified personal risk factors, not elsewhere classified: Secondary | ICD-10-CM

## 2020-03-05 DIAGNOSIS — E7849 Other hyperlipidemia: Secondary | ICD-10-CM

## 2020-03-05 DIAGNOSIS — E119 Type 2 diabetes mellitus without complications: Secondary | ICD-10-CM | POA: Diagnosis not present

## 2020-03-05 DIAGNOSIS — R5383 Other fatigue: Secondary | ICD-10-CM

## 2020-03-05 DIAGNOSIS — E559 Vitamin D deficiency, unspecified: Secondary | ICD-10-CM

## 2020-03-05 DIAGNOSIS — Z6841 Body Mass Index (BMI) 40.0 and over, adult: Secondary | ICD-10-CM

## 2020-03-05 MED ORDER — VITAMIN D (ERGOCALCIFEROL) 1.25 MG (50000 UNIT) PO CAPS: 50000.0000 [IU] | ORAL_CAPSULE | ORAL | 0 refills | Status: DC

## 2020-03-05 NOTE — Progress Notes (Signed)
Chief Complaint:   OBESITY Luke Larsen is here to discuss his progress with his obesity treatment plan along with follow-up of his obesity related diagnoses. Luke Larsen is on the Category 4 Plan and states he is following his eating plan approximately 85-90% of the time. Luke Larsen states he is walking 30-120 minutes 5-6 times per week.  Today's visit was #: 7 Starting weight: 393 lbs Starting date: 11/28/2019 Today's weight: 345 lbs Today's date: 03/05/2020 Total lbs lost to date: 48 Total lbs lost since last in-office visit: 11  Interim History: Luke Larsen has done well with weight loss. He just returned from [place] where he did a good job staying on plan. He is enjoying the plan without any struggles.  Subjective:   Vitamin D deficiency. Luke Larsen is on prescription Vitamin D weekly. No nausea, vomiting, or muscle weakness. Last Vitamin D 19.2 on 11/28/2019.  Other hyperlipidemia. Luke Larsen is on no medication. Last lipid panel was not at goal. No chest pain. He is due for labs.   Lab Results  Component Value Date   CHOL 184 11/28/2019   HDL 41 11/28/2019   LDLCALC 112 (H) 11/28/2019   TRIG 174 (H) 11/28/2019   Lab Results  Component Value Date   ALT 58 (H) 11/28/2019   AST 52 (H) 11/28/2019   ALKPHOS 94 11/28/2019   BILITOT 0.4 11/28/2019   The 10-year ASCVD risk score Luke Larsen DC Jr., et al., 2013) is: 23.6%   Values used to calculate the score:     Age: 64 years     Sex: Male     Is Non-Hispanic African American: No     Diabetic: Yes     Tobacco smoker: No     Systolic Blood Pressure: 0000000 mmHg     Is BP treated: Yes     HDL Cholesterol: 41 mg/dL     Total Cholesterol: 184 mg/dL  Type 2 diabetes mellitus without complication, without long-term current use of insulin (Luke Larsen). Luke Larsen is on no medication. He reports checking his blood sugars "every once in a while" and averages 130 after eating.   Lab Results  Component Value Date   HGBA1C 7.3 (H) 11/28/2019   HGBA1C 6.0 (H)  02/08/2013   Lab Results  Component Value Date   LDLCALC 112 (H) 11/28/2019   CREATININE 0.86 11/28/2019   Lab Results  Component Value Date   INSULIN 44.6 (H) 11/28/2019   Other fatigue. Luke Larsen reports fatigue and some hair loss as well as heat/cold intolerance.  At risk for hypoglycemia. Luke Larsen is at increased risk for hypoglycemia due to changes in diet, diagnosis of diabetes, and/or insulin use.    Assessment/Plan:   Vitamin D deficiency. Low Vitamin D level contributes to fatigue and are associated with obesity, breast, and colon cancer. He was given a refill on his Vitamin D, Ergocalciferol, (DRISDOL) 1.25 MG (50000 UNIT) CAPS capsule every week #4 with 0 refills and VITAMIN D 25 Hydroxy (Vit-D Deficiency, Fractures) level was ordered today.  Other hyperlipidemia. Cardiovascular risk and specific lipid/LDL goals reviewed.  We discussed several lifestyle modifications today and Luke Larsen will continue to work on diet, exercise and weight loss efforts. Orders and follow up as documented in patient record. Lipid Panel With LDL/HDL Ratio labs ordered today.  Counseling Intensive lifestyle modifications are the first line treatment for this issue. . Dietary changes: Increase soluble fiber. Decrease simple carbohydrates. . Exercise changes: Moderate to vigorous-intensity aerobic activity 150 minutes per week if tolerated. . Lipid-lowering medications:  see documented in medical record.    Type 2 diabetes mellitus without complication, without long-term current use of insulin (Luke Larsen). Good blood sugar control is important to decrease the likelihood of diabetic complications such as nephropathy, neuropathy, limb loss, blindness, coronary artery disease, and death. Intensive lifestyle modification including diet, exercise and weight loss are the first line of treatment for diabetes. Comprehensive metabolic panel, Hemoglobin A1c, Insulin, random labs ordered today.  Other fatigue. Fatigue may be  related to obesity, depression or many other causes. Labs will be ordered, and in the meanwhile, Luke Larsen will focus on self care including making healthy food choices, increasing physical activity and focusing on stress reduction. TSH, T4, free, T3 levels ordered today.  At risk for hypoglycemia. Luke Larsen was given approximately 15 minutes of counseling today regarding prevention of hypoglycemia. He was advised of symptoms of hypoglycemia. Luke Larsen was instructed to avoid skipping meals, eat regular protein rich meals and schedule low calorie snacks as needed.   Repetitive spaced learning was employed today to elicit superior memory formation and behavioral change.  Class 3 severe obesity with serious comorbidity and body mass index (BMI) of 45.0 to 49.9 in adult, unspecified obesity type (Luke Larsen).  Luke Larsen is currently in the action stage of change. As such, his goal is to continue with weight loss efforts. He has agreed to the Category 4 Plan.   Exercise goals: For substantial health benefits, adults should do at least 150 minutes (2 hours and 30 minutes) a week of moderate-intensity, or 75 minutes (1 hour and 15 minutes) a week of vigorous-intensity aerobic physical activity, or an equivalent combination of moderate- and vigorous-intensity aerobic activity. Aerobic activity should be performed in episodes of at least 10 minutes, and preferably, it should be spread throughout the week.  Behavioral modification strategies: meal planning and cooking strategies and planning for success.  Luke Larsen has agreed to follow-up with our clinic in 2 weeks. He was informed of the importance of frequent follow-up visits to maximize his success with intensive lifestyle modifications for his multiple health conditions.   Luke Larsen was informed we would discuss his lab results at his next visit unless there is a critical issue that needs to be addressed sooner. Luke Larsen agreed to keep his next visit at the agreed upon time to discuss  these results.  Objective:   Blood pressure 137/76, pulse 69, temperature 98 F (36.7 C), temperature source Oral, height 6' (1.829 m), weight (!) 345 lb (156.5 kg), SpO2 97 %. Body mass index is 46.79 kg/m.  General: Cooperative, alert, well developed, in no acute distress. HEENT: Conjunctivae and lids unremarkable. Cardiovascular: Regular rhythm.  Lungs: Normal work of breathing. Neurologic: No focal deficits.   Lab Results  Component Value Date   CREATININE 0.86 11/28/2019   BUN 11 11/28/2019   NA 138 11/28/2019   K 4.5 11/28/2019   CL 101 11/28/2019   CO2 23 11/28/2019   Lab Results  Component Value Date   ALT 58 (H) 11/28/2019   AST 52 (H) 11/28/2019   ALKPHOS 94 11/28/2019   BILITOT 0.4 11/28/2019   Lab Results  Component Value Date   HGBA1C 7.3 (H) 11/28/2019   HGBA1C 6.0 (H) 02/08/2013   Lab Results  Component Value Date   INSULIN 44.6 (H) 11/28/2019   Lab Results  Component Value Date   TSH 4.680 (H) 11/28/2019   Lab Results  Component Value Date   CHOL 184 11/28/2019   HDL 41 11/28/2019   LDLCALC 112 (H) 11/28/2019  TRIG 174 (H) 11/28/2019   Lab Results  Component Value Date   WBC 7.6 11/28/2019   HGB 15.4 11/28/2019   HCT 44.9 11/28/2019   MCV 92 11/28/2019   PLT 203 11/28/2019   No results found for: IRON, TIBC, FERRITIN  Attestation Statements:   Reviewed by clinician on day of visit: allergies, medications, problem list, medical history, surgical history, family history, social history, and previous encounter notes.  IMichaelene Song, am acting as transcriptionist for Abby Potash, PA-C   I have reviewed the above documentation for accuracy and completeness, and I agree with the above. Abby Potash, PA-C

## 2020-03-06 LAB — COMPREHENSIVE METABOLIC PANEL
ALT: 22 IU/L (ref 0–44)
AST: 20 IU/L (ref 0–40)
Albumin/Globulin Ratio: 2 (ref 1.2–2.2)
Albumin: 4.5 g/dL (ref 3.8–4.8)
Alkaline Phosphatase: 74 IU/L (ref 48–121)
BUN/Creatinine Ratio: 17 (ref 10–24)
BUN: 17 mg/dL (ref 8–27)
Bilirubin Total: 0.3 mg/dL (ref 0.0–1.2)
CO2: 22 mmol/L (ref 20–29)
Calcium: 9.4 mg/dL (ref 8.6–10.2)
Chloride: 103 mmol/L (ref 96–106)
Creatinine, Ser: 1.03 mg/dL (ref 0.76–1.27)
GFR calc Af Amer: 90 mL/min/{1.73_m2} (ref >59)
GFR calc non Af Amer: 78 mL/min/{1.73_m2} (ref >59)
Globulin, Total: 2.2 g/dL (ref 1.5–4.5)
Glucose: 92 mg/dL (ref 65–99)
Potassium: 4.5 mmol/L (ref 3.5–5.2)
Sodium: 145 mmol/L — ABNORMAL HIGH (ref 134–144)
Total Protein: 6.7 g/dL (ref 6.0–8.5)

## 2020-03-06 LAB — INSULIN, RANDOM: INSULIN: 32.2 u[IU]/mL — ABNORMAL HIGH (ref 2.6–24.9)

## 2020-03-06 LAB — LIPID PANEL WITH LDL/HDL RATIO
Cholesterol, Total: 161 mg/dL (ref 100–199)
HDL: 39 mg/dL — ABNORMAL LOW (ref >39)
LDL Chol Calc (NIH): 100 mg/dL — ABNORMAL HIGH (ref 0–99)
LDL/HDL Ratio: 2.6 ratio (ref 0.0–3.6)
Triglycerides: 123 mg/dL (ref 0–149)
VLDL Cholesterol Cal: 22 mg/dL (ref 5–40)

## 2020-03-06 LAB — HEMOGLOBIN A1C
Est. average glucose Bld gHb Est-mCnc: 111 mg/dL
Hgb A1c MFr Bld: 5.5 % (ref 4.8–5.6)

## 2020-03-06 LAB — VITAMIN D 25 HYDROXY (VIT D DEFICIENCY, FRACTURES): Vit D, 25-Hydroxy: 42.8 ng/mL (ref 30.0–100.0)

## 2020-03-06 LAB — T3: T3, Total: 105 ng/dL (ref 71–180)

## 2020-03-06 LAB — TSH: TSH: 3.71 u[IU]/mL (ref 0.450–4.500)

## 2020-03-06 LAB — T4, FREE: Free T4: 1.06 ng/dL (ref 0.82–1.77)

## 2020-03-25 ENCOUNTER — Ambulatory Visit (INDEPENDENT_AMBULATORY_CARE_PROVIDER_SITE_OTHER): Payer: BC Managed Care – PPO | Admitting: Physician Assistant

## 2020-03-25 ENCOUNTER — Encounter (INDEPENDENT_AMBULATORY_CARE_PROVIDER_SITE_OTHER): Payer: Self-pay | Admitting: Physician Assistant

## 2020-03-25 ENCOUNTER — Other Ambulatory Visit: Payer: Self-pay

## 2020-03-25 VITALS — BP 157/77 | HR 65 | Temp 98.3°F | Ht 72.0 in | Wt 361.0 lb

## 2020-03-25 DIAGNOSIS — E1169 Type 2 diabetes mellitus with other specified complication: Secondary | ICD-10-CM | POA: Diagnosis not present

## 2020-03-25 DIAGNOSIS — E559 Vitamin D deficiency, unspecified: Secondary | ICD-10-CM

## 2020-03-25 DIAGNOSIS — E785 Hyperlipidemia, unspecified: Secondary | ICD-10-CM | POA: Diagnosis not present

## 2020-03-25 DIAGNOSIS — Z6841 Body Mass Index (BMI) 40.0 and over, adult: Secondary | ICD-10-CM

## 2020-03-25 NOTE — Progress Notes (Signed)
Chief Complaint:   OBESITY Luke Larsen is here to discuss his progress with his obesity treatment plan along with follow-up of his obesity related diagnoses. Luke Larsen is on the Category 4 Plan and states he is following his eating plan approximately 60% of the time. Luke Larsen states he is walking 25 minutes 7 times per week.  Today's visit was #: 8 Starting weight: 393 lbs Starting date: 11/28/2019 Today's weight: 361 lbs Today's date: 03/25/2020 Total lbs lost to date: 32 Total lbs lost since last in-office visit: 0  Interim History: Luke Larsen states that he has been really off track the last 2 weeks. He has been eating fried foods, eating cake, and drinking sodas. He feels  like he is carrying extra fluid today.  Subjective:   Type 2 diabetes mellitus with hyperlipidemia (Luke Larsen). Last A1c 5.5 (improved from 7.3). Luke Larsen is on no medication. No polyphagia.   Lab Results  Component Value Date   HGBA1C 5.5 03/05/2020   HGBA1C 7.3 (H) 11/28/2019   HGBA1C 6.0 (H) 02/08/2013   Lab Results  Component Value Date   LDLCALC 100 (H) 03/05/2020   CREATININE 1.03 03/05/2020   Lab Results  Component Value Date   INSULIN 32.2 (H) 03/05/2020   INSULIN 44.6 (H) 11/28/2019   Vitamin D deficiency. No nausea, vomiting, or muscle weakness on Vitamin D. Last Vitamin D level was improved at 42.8 on 03/05/2020.  Assessment/Plan:   Type 2 diabetes mellitus with hyperlipidemia ([place]). Good blood sugar control is important to decrease the likelihood of diabetic complications such as nephropathy, neuropathy, limb loss, blindness, coronary artery disease, and death. Intensive lifestyle modification including diet, exercise and weight loss are the first line of treatment for diabetes.   Vitamin D deficiency. Low Vitamin D level contributes to fatigue and are associated with obesity, breast, and colon cancer. He will change to OTC Vitamin D 4,000 units daily and discontinue prescription Vitamin D. He will  follow-up for routine testing of Vitamin D, at least 2-3 times per year to avoid over-replacement.  Class 3 severe obesity with serious comorbidity and body mass index (BMI) of 45.0 to 49.9 in adult, unspecified obesity type ([place]).  Luke Larsen is currently in the action stage of change. As such, his goal is to continue with weight loss efforts. He has agreed to the Category 4 Plan.   Exercise goals: For substantial health benefits, adults should do at least 150 minutes (2 hours and 30 minutes) a week of moderate-intensity, or 75 minutes (1 hour and 15 minutes) a week of vigorous-intensity aerobic physical activity, or an equivalent combination of moderate- and vigorous-intensity aerobic activity. Aerobic activity should be performed in episodes of at least 10 minutes, and preferably, it should be spread throughout the week.  Behavioral modification strategies: meal planning and cooking strategies and keeping healthy foods in the home.  Luke Larsen has agreed to follow-up with our clinic in 2 weeks. He was informed of the importance of frequent follow-up visits to maximize his success with intensive lifestyle modifications for his multiple health conditions.   Objective:   Blood pressure (!) 157/77, pulse 65, temperature 98.3 F (36.8 C), temperature source Oral, height 6' (1.829 m), weight (!) 361 lb (163.7 kg), SpO2 97 %. Body mass index is 48.96 kg/m.  General: Cooperative, alert, well developed, in no acute distress. HEENT: Conjunctivae and lids unremarkable. Cardiovascular: Regular rhythm.  Lungs: Normal work of breathing. Neurologic: No focal deficits.   Lab Results  Component Value Date  CREATININE 1.03 03/05/2020   BUN 17 03/05/2020   NA 145 (H) 03/05/2020   K 4.5 03/05/2020   CL 103 03/05/2020   CO2 22 03/05/2020   Lab Results  Component Value Date   ALT 22 03/05/2020   AST 20 03/05/2020   ALKPHOS 74 03/05/2020   BILITOT 0.3 03/05/2020   Lab Results  Component Value Date    HGBA1C 5.5 03/05/2020   HGBA1C 7.3 (H) 11/28/2019   HGBA1C 6.0 (H) 02/08/2013   Lab Results  Component Value Date   INSULIN 32.2 (H) 03/05/2020   INSULIN 44.6 (H) 11/28/2019   Lab Results  Component Value Date   TSH 3.710 03/05/2020   Lab Results  Component Value Date   CHOL 161 03/05/2020   HDL 39 (L) 03/05/2020   LDLCALC 100 (H) 03/05/2020   TRIG 123 03/05/2020   Lab Results  Component Value Date   WBC 7.6 11/28/2019   HGB 15.4 11/28/2019   HCT 44.9 11/28/2019   MCV 92 11/28/2019   PLT 203 11/28/2019   No results found for: IRON, TIBC, FERRITIN  Attestation Statements:   Reviewed by clinician on day of visit: allergies, medications, problem list, medical history, surgical history, family history, social history, and previous encounter notes.  Time spent on visit including pre-visit chart review and post-visit charting and care was 30 minutes.   IMichaelene Song, am acting as transcriptionist for Abby Potash, PA-C   I have reviewed the above documentation for accuracy and completeness, and I agree with the above. Abby Potash, PA-C

## 2020-03-29 ENCOUNTER — Other Ambulatory Visit (INDEPENDENT_AMBULATORY_CARE_PROVIDER_SITE_OTHER): Payer: Self-pay | Admitting: Physician Assistant

## 2020-03-29 DIAGNOSIS — E559 Vitamin D deficiency, unspecified: Secondary | ICD-10-CM

## 2020-04-10 ENCOUNTER — Other Ambulatory Visit: Payer: Self-pay

## 2020-04-10 ENCOUNTER — Ambulatory Visit (INDEPENDENT_AMBULATORY_CARE_PROVIDER_SITE_OTHER): Payer: BC Managed Care – PPO | Admitting: Physician Assistant

## 2020-04-10 ENCOUNTER — Encounter (INDEPENDENT_AMBULATORY_CARE_PROVIDER_SITE_OTHER): Payer: Self-pay | Admitting: Physician Assistant

## 2020-04-10 VITALS — BP 125/73 | HR 66 | Temp 97.6°F | Ht 72.0 in | Wt 348.0 lb

## 2020-04-10 DIAGNOSIS — I10 Essential (primary) hypertension: Secondary | ICD-10-CM | POA: Diagnosis not present

## 2020-04-10 DIAGNOSIS — Z6841 Body Mass Index (BMI) 40.0 and over, adult: Secondary | ICD-10-CM | POA: Diagnosis not present

## 2020-04-11 NOTE — Progress Notes (Signed)
Chief Complaint:   OBESITY Luke Larsen is here to discuss his progress with his obesity treatment plan along with follow-up of his obesity related diagnoses. Luke Larsen is on the Category 4 Plan and states he is following his eating plan approximately 80% of the time. Luke Larsen states he is walking 15-20 minutes 7 times per week.  Today's visit was #: 9 Starting weight: 393 lbs Starting date: 11/28/2019 Today's weight: 348 lbs Today's date: 04/10/2020 Total lbs lost to date: 45 Total lbs lost since last in-office visit: 13  Interim History: Luke Larsen has done a much better job staying on plan. He is going to start traveling for work again in the next 2 months.  Subjective:   Essential hypertension. Blood pressure is controlled on Diovan. No  chest pain or headache.   BP Readings from Last 3 Encounters:  04/10/20 125/73  03/25/20 (!) 157/77  03/05/20 137/76   Lab Results  Component Value Date   CREATININE 1.03 03/05/2020   CREATININE 0.86 11/28/2019   CREATININE 1.02 09/28/2018   Assessment/Plan:   Essential hypertension. Luke Larsen is working on healthy weight loss and exercise to improve blood pressure control. We will watch for signs of hypotension as he continues his lifestyle modifications. He will continue his medication as directed.  Class 3 severe obesity with serious comorbidity and body mass index (BMI) of 45.0 to 49.9 in adult, unspecified obesity type ([place]).  Luke Larsen is currently in the action stage of change. As such, his goal is to continue with weight loss efforts. He has agreed to the Category 4 Plan.   Exercise goals: For substantial health benefits, adults should do at least 150 minutes (2 hours and 30 minutes) a week of moderate-intensity, or 75 minutes (1 hour and 15 minutes) a week of vigorous-intensity aerobic physical activity, or an equivalent combination of moderate- and vigorous-intensity aerobic activity. Aerobic activity should be performed in episodes of at least  10 minutes, and preferably, it should be spread throughout the week.  Behavioral modification strategies: decreasing eating out and meal planning and cooking strategies.  Luke Larsen has agreed to follow-up with our clinic in 4 weeks. He was informed of the importance of frequent follow-up visits to maximize his success with intensive lifestyle modifications for his multiple health conditions.   Objective:   Blood pressure 125/73, pulse 66, temperature 97.6 F (36.4 C), temperature source Oral, height 6' (1.829 m), weight (!) 348 lb (157.9 kg), SpO2 98 %. Body mass index is 47.2 kg/m.  General: Cooperative, alert, well developed, in no acute distress. HEENT: Conjunctivae and lids unremarkable. Cardiovascular: Regular rhythm.  Lungs: Normal work of breathing. Neurologic: No focal deficits.   Lab Results  Component Value Date   CREATININE 1.03 03/05/2020   BUN 17 03/05/2020   NA 145 (H) 03/05/2020   K 4.5 03/05/2020   CL 103 03/05/2020   CO2 22 03/05/2020   Lab Results  Component Value Date   ALT 22 03/05/2020   AST 20 03/05/2020   ALKPHOS 74 03/05/2020   BILITOT 0.3 03/05/2020   Lab Results  Component Value Date   HGBA1C 5.5 03/05/2020   HGBA1C 7.3 (H) 11/28/2019   HGBA1C 6.0 (H) 02/08/2013   Lab Results  Component Value Date   INSULIN 32.2 (H) 03/05/2020   INSULIN 44.6 (H) 11/28/2019   Lab Results  Component Value Date   TSH 3.710 03/05/2020   Lab Results  Component Value Date   CHOL 161 03/05/2020   HDL 39 (L)  03/05/2020   LDLCALC 100 (H) 03/05/2020   TRIG 123 03/05/2020   Lab Results  Component Value Date   WBC 7.6 11/28/2019   HGB 15.4 11/28/2019   HCT 44.9 11/28/2019   MCV 92 11/28/2019   PLT 203 11/28/2019   No results found for: IRON, TIBC, FERRITIN  Attestation Statements:   Reviewed by clinician on day of visit: allergies, medications, problem list, medical history, surgical history, family history, social history, and previous encounter  notes.  Time spent on visit including pre-visit chart review and post-visit charting and care was 31 minutes.   IMichaelene Song, am acting as transcriptionist for Abby Potash, PA-C   I have reviewed the above documentation for accuracy and completeness, and I agree with the above. Abby Potash, PA-C

## 2020-05-05 ENCOUNTER — Encounter (INDEPENDENT_AMBULATORY_CARE_PROVIDER_SITE_OTHER): Payer: Self-pay | Admitting: Physician Assistant

## 2020-05-08 ENCOUNTER — Ambulatory Visit (INDEPENDENT_AMBULATORY_CARE_PROVIDER_SITE_OTHER): Payer: BC Managed Care – PPO | Admitting: Physician Assistant

## 2020-05-20 ENCOUNTER — Other Ambulatory Visit: Payer: Self-pay

## 2020-05-20 ENCOUNTER — Encounter (INDEPENDENT_AMBULATORY_CARE_PROVIDER_SITE_OTHER): Payer: Self-pay | Admitting: Physician Assistant

## 2020-05-20 ENCOUNTER — Ambulatory Visit (INDEPENDENT_AMBULATORY_CARE_PROVIDER_SITE_OTHER): Payer: BC Managed Care – PPO | Admitting: Physician Assistant

## 2020-05-20 VITALS — BP 158/78 | HR 71 | Temp 98.0°F | Ht 72.0 in | Wt 360.0 lb

## 2020-05-20 DIAGNOSIS — Z6841 Body Mass Index (BMI) 40.0 and over, adult: Secondary | ICD-10-CM | POA: Diagnosis not present

## 2020-05-20 DIAGNOSIS — E7849 Other hyperlipidemia: Secondary | ICD-10-CM | POA: Diagnosis not present

## 2020-05-20 DIAGNOSIS — E559 Vitamin D deficiency, unspecified: Secondary | ICD-10-CM

## 2020-05-21 NOTE — Progress Notes (Signed)
Chief Complaint:   OBESITY Luke Larsen is here to discuss his progress with his obesity treatment plan along with follow-up of his obesity related diagnoses. Zaheer is on the Category 4 Plan and states he is following his eating plan approximately 35% of the time. Tabor states he is walking/yard work/golf 20-25 minutes 3-4 times per week.  Today's visit was #: 10 Starting weight: 393 lbs Starting date: 11/28/2019 Today's weight: 360 lbs Today's date: 05/20/2020 Total lbs lost to date: 33 Total lbs lost since last in-office visit: 0  Interim History: Saige reports that he has been traveling a lot and has been eating "convenience" fast foods. He has not been meal planning since he has been home.  Subjective:   Vitamin D deficiency. Sally is on Vitamin D 4,000 units daily, which he is tolerating well.   Ref. Range 03/05/2020 11:51  Vitamin D, 25-Hydroxy Latest Ref Range: 30.0 - 100.0 ng/mL 42.8   Other hyperlipidemia. Blase is on no medication. He denies chest pain.  Lab Results  Component Value Date   CHOL 161 03/05/2020   HDL 39 (L) 03/05/2020   LDLCALC 100 (H) 03/05/2020   TRIG 123 03/05/2020   Lab Results  Component Value Date   ALT 22 03/05/2020   AST 20 03/05/2020   ALKPHOS 74 03/05/2020   BILITOT 0.3 03/05/2020   The 10-year ASCVD risk score Mikey Bussing DC Jr., et al., 2013) is: 29.4%   Values used to calculate the score:     Age: 64 years     Sex: Male     Is Non-Hispanic African American: No     Diabetic: Yes     Tobacco smoker: No     Systolic Blood Pressure: 983 mmHg     Is BP treated: Yes     HDL Cholesterol: 39 mg/dL     Total Cholesterol: 161 mg/dL  Assessment/Plan:   Vitamin D deficiency. Low Vitamin D level contributes to fatigue and are associated with obesity, breast, and colon cancer. He agrees to continue to take Vitamin D as directed and will follow-up for routine testing of Vitamin D, at least 2-3 times per year to avoid over-replacement.  Other  hyperlipidemia. Cardiovascular risk and specific lipid/LDL goals reviewed.  We discussed several lifestyle modifications today and Dragon will continue to work on diet, exercise and weight loss efforts. Orders and follow up as documented in patient record.   Counseling Intensive lifestyle modifications are the first line treatment for this issue. . Dietary changes: Increase soluble fiber. Decrease simple carbohydrates. . Exercise changes: Moderate to vigorous-intensity aerobic activity 150 minutes per week if tolerated. . Lipid-lowering medications: see documented in medical record.  Class 3 severe obesity with serious comorbidity and body mass index (BMI) of 45.0 to 49.9 in adult, unspecified obesity type ([place]).  Migel is currently in the action stage of change. As such, his goal is to continue with weight loss efforts. He has agreed to the Category 4 Plan.   Exercise goals: For substantial health benefits, adults should do at least 150 minutes (2 hours and 30 minutes) a week of moderate-intensity, or 75 minutes (1 hour and 15 minutes) a week of vigorous-intensity aerobic physical activity, or an equivalent combination of moderate- and vigorous-intensity aerobic activity. Aerobic activity should be performed in episodes of at least 10 minutes, and preferably, it should be spread throughout the week.  Behavioral modification strategies: meal planning and cooking strategies and keeping healthy foods in the home.  [name]  has agreed to follow-up with our clinic in 2 weeks. He was informed of the importance of frequent follow-up visits to maximize his success with intensive lifestyle modifications for his multiple health conditions.   Objective:   Blood pressure (!) 158/78, pulse 71, temperature 98 F (36.7 C), temperature source Oral, height 6' (1.829 m), weight (!) 360 lb (163.3 kg), SpO2 96 %. Body mass index is 48.82 kg/m.  General: Cooperative, alert, well developed, in no acute  distress. HEENT: Conjunctivae and lids unremarkable. Cardiovascular: Regular rhythm.  Lungs: Normal work of breathing. Neurologic: No focal deficits.   Lab Results  Component Value Date   CREATININE 1.03 03/05/2020   BUN 17 03/05/2020   NA 145 (H) 03/05/2020   K 4.5 03/05/2020   CL 103 03/05/2020   CO2 22 03/05/2020   Lab Results  Component Value Date   ALT 22 03/05/2020   AST 20 03/05/2020   ALKPHOS 74 03/05/2020   BILITOT 0.3 03/05/2020   Lab Results  Component Value Date   HGBA1C 5.5 03/05/2020   HGBA1C 7.3 (H) 11/28/2019   HGBA1C 6.0 (H) 02/08/2013   Lab Results  Component Value Date   INSULIN 32.2 (H) 03/05/2020   INSULIN 44.6 (H) 11/28/2019   Lab Results  Component Value Date   TSH 3.710 03/05/2020   Lab Results  Component Value Date   CHOL 161 03/05/2020   HDL 39 (L) 03/05/2020   LDLCALC 100 (H) 03/05/2020   TRIG 123 03/05/2020   Lab Results  Component Value Date   WBC 7.6 11/28/2019   HGB 15.4 11/28/2019   HCT 44.9 11/28/2019   MCV 92 11/28/2019   PLT 203 11/28/2019   No results found for: IRON, TIBC, FERRITIN  Attestation Statements:   Reviewed by clinician on day of visit: allergies, medications, problem list, medical history, surgical history, family history, social history, and previous encounter notes.  Time spent on visit including pre-visit chart review and post-visit charting and care was 30 minutes.   IMichaelene Song, am acting as transcriptionist for Abby Potash, PA-C   I have reviewed the above documentation for accuracy and completeness, and I agree with the above. Abby Potash, PA-C

## 2020-06-03 ENCOUNTER — Other Ambulatory Visit: Payer: Self-pay

## 2020-06-03 ENCOUNTER — Encounter (INDEPENDENT_AMBULATORY_CARE_PROVIDER_SITE_OTHER): Payer: Self-pay | Admitting: Physician Assistant

## 2020-06-03 ENCOUNTER — Ambulatory Visit (INDEPENDENT_AMBULATORY_CARE_PROVIDER_SITE_OTHER): Payer: BC Managed Care – PPO | Admitting: Physician Assistant

## 2020-06-03 VITALS — BP 139/81 | HR 84 | Temp 98.2°F | Ht 72.0 in | Wt 357.0 lb

## 2020-06-03 DIAGNOSIS — I10 Essential (primary) hypertension: Secondary | ICD-10-CM | POA: Diagnosis not present

## 2020-06-03 DIAGNOSIS — E1169 Type 2 diabetes mellitus with other specified complication: Secondary | ICD-10-CM | POA: Diagnosis not present

## 2020-06-03 DIAGNOSIS — E785 Hyperlipidemia, unspecified: Secondary | ICD-10-CM

## 2020-06-03 DIAGNOSIS — E7849 Other hyperlipidemia: Secondary | ICD-10-CM

## 2020-06-03 DIAGNOSIS — Z6841 Body Mass Index (BMI) 40.0 and over, adult: Secondary | ICD-10-CM

## 2020-06-05 NOTE — Progress Notes (Signed)
Chief Complaint:   OBESITY Luke Larsen is here to discuss his progress with his obesity treatment plan along with follow-up of his obesity related diagnoses. Luke Larsen is on the Category 4 Plan and states he is following his eating plan approximately 60% of the time. Luke Larsen states he is walking/yard work/golfing 45 minutes 3-4 times per week.  Today's visit was #: 11 Starting weight: 393 lbs Starting date: 11/28/2019 Today's weight: 357 lbs Today's date: 06/03/2020 Total lbs lost to date: 36 Total lbs lost since last in-office visit: 3  Interim History: Luke Larsen reports that he will have a good 3-4 days and then go off plan. He continues to eat burgers and fries and drink sodas.  Subjective:   Type 2 diabetes mellitus with hyperlipidemia (Luke Larsen). Luke Larsen is on no medication. No polyphagia.   Lab Results  Component Value Date   HGBA1C 5.5 03/05/2020   HGBA1C 7.3 (H) 11/28/2019   HGBA1C 6.0 (H) 02/08/2013   Lab Results  Component Value Date   LDLCALC 100 (H) 03/05/2020   CREATININE 1.03 03/05/2020   Lab Results  Component Value Date   INSULIN 32.2 (H) 03/05/2020   INSULIN 44.6 (H) 11/28/2019   Essential hypertension. Luke Larsen is on Diovan. Blood pressure is controlled today.  BP Readings from Last 3 Encounters:  06/03/20 139/81  05/20/20 (!) 158/78  04/10/20 125/73   Lab Results  Component Value Date   CREATININE 1.03 03/05/2020   CREATININE 0.86 11/28/2019   CREATININE 1.02 09/28/2018   Assessment/Plan:   Type 2 diabetes mellitus with hyperlipidemia (Luke Larsen). Good blood sugar control is important to decrease the likelihood of diabetic complications such as nephropathy, neuropathy, limb loss, blindness, coronary artery disease, and death. Intensive lifestyle modification including diet, exercise and weight loss are the first line of treatment for diabetes.   Essential hypertension. Luke Larsen is working on healthy weight loss and exercise to improve blood pressure control. We will  watch for signs of hypotension as he continues his lifestyle modifications. He will continue his medication as directed.   Class 3 severe obesity with serious comorbidity and body mass index (BMI) of 45.0 to 49.9 in adult, unspecified obesity type (Luke Larsen).  Luke Larsen is currently in the action stage of change. As such, his goal is to continue with weight loss efforts. He has agreed to the Category 4 Plan.   Exercise goals: For substantial health benefits, adults should do at least 150 minutes (2 hours and 30 minutes) a week of moderate-intensity, or 75 minutes (1 hour and 15 minutes) a week of vigorous-intensity aerobic physical activity, or an equivalent combination of moderate- and vigorous-intensity aerobic activity. Aerobic activity should be performed in episodes of at least 10 minutes, and preferably, it should be spread throughout the week.  Behavioral modification strategies: increasing lean protein intake and decreasing eating out.  Luke Larsen has agreed to follow-up with our clinic in 2 weeks. He was informed of the importance of frequent follow-up visits to maximize his success with intensive lifestyle modifications for his multiple health conditions.   Objective:   Blood pressure 139/81, pulse 84, temperature 98.2 F (36.8 C), temperature source Oral, height 6' (1.829 m), weight (!) 357 lb (161.9 kg), SpO2 99 %. Body mass index is 48.42 kg/m.  General: Cooperative, alert, well developed, in no acute distress. HEENT: Conjunctivae and lids unremarkable. Cardiovascular: Regular rhythm.  Lungs: Normal work of breathing. Neurologic: No focal deficits.   Lab Results  Component Value Date   CREATININE 1.03 03/05/2020  BUN 17 03/05/2020   NA 145 (H) 03/05/2020   K 4.5 03/05/2020   CL 103 03/05/2020   CO2 22 03/05/2020   Lab Results  Component Value Date   ALT 22 03/05/2020   AST 20 03/05/2020   ALKPHOS 74 03/05/2020   BILITOT 0.3 03/05/2020   Lab Results  Component Value Date    HGBA1C 5.5 03/05/2020   HGBA1C 7.3 (H) 11/28/2019   HGBA1C 6.0 (H) 02/08/2013   Lab Results  Component Value Date   INSULIN 32.2 (H) 03/05/2020   INSULIN 44.6 (H) 11/28/2019   Lab Results  Component Value Date   TSH 3.710 03/05/2020   Lab Results  Component Value Date   CHOL 161 03/05/2020   HDL 39 (L) 03/05/2020   LDLCALC 100 (H) 03/05/2020   TRIG 123 03/05/2020   Lab Results  Component Value Date   WBC 7.6 11/28/2019   HGB 15.4 11/28/2019   HCT 44.9 11/28/2019   MCV 92 11/28/2019   PLT 203 11/28/2019   No results found for: IRON, TIBC, FERRITIN  Attestation Statements:   Reviewed by clinician on day of visit: allergies, medications, problem list, medical history, surgical history, family history, social history, and previous encounter notes.  Time spent on visit including pre-visit chart review and post-visit charting and care was 30 minutes.   Luke Larsen, am acting as transcriptionist for Luke Potash, PA-C   I have reviewed the above documentation for accuracy and completeness, and I agree with the above. Luke Potash, PA-C

## 2020-06-13 ENCOUNTER — Encounter (INDEPENDENT_AMBULATORY_CARE_PROVIDER_SITE_OTHER): Payer: Self-pay | Admitting: Physician Assistant

## 2020-06-17 ENCOUNTER — Ambulatory Visit (INDEPENDENT_AMBULATORY_CARE_PROVIDER_SITE_OTHER): Payer: BC Managed Care – PPO | Admitting: Physician Assistant

## 2020-07-01 ENCOUNTER — Ambulatory Visit (INDEPENDENT_AMBULATORY_CARE_PROVIDER_SITE_OTHER): Payer: BC Managed Care – PPO | Admitting: Physician Assistant

## 2020-07-02 ENCOUNTER — Other Ambulatory Visit: Payer: Self-pay

## 2020-07-02 ENCOUNTER — Ambulatory Visit (INDEPENDENT_AMBULATORY_CARE_PROVIDER_SITE_OTHER): Payer: BC Managed Care – PPO | Admitting: Physician Assistant

## 2020-07-02 ENCOUNTER — Encounter (INDEPENDENT_AMBULATORY_CARE_PROVIDER_SITE_OTHER): Payer: Self-pay | Admitting: Physician Assistant

## 2020-07-02 VITALS — BP 122/63 | HR 70 | Temp 98.0°F | Ht 72.0 in | Wt 353.0 lb

## 2020-07-02 DIAGNOSIS — E559 Vitamin D deficiency, unspecified: Secondary | ICD-10-CM | POA: Diagnosis not present

## 2020-07-02 DIAGNOSIS — Z6841 Body Mass Index (BMI) 40.0 and over, adult: Secondary | ICD-10-CM

## 2020-07-02 DIAGNOSIS — E785 Hyperlipidemia, unspecified: Secondary | ICD-10-CM | POA: Diagnosis not present

## 2020-07-02 DIAGNOSIS — E1169 Type 2 diabetes mellitus with other specified complication: Secondary | ICD-10-CM | POA: Diagnosis not present

## 2020-07-02 DIAGNOSIS — E66813 Obesity, class 3: Secondary | ICD-10-CM

## 2020-07-02 NOTE — Progress Notes (Signed)
Chief Complaint:   OBESITY Luke Larsen is here to discuss his progress with his obesity treatment plan along with follow-up of his obesity related diagnoses. Luke Larsen is on the Category 4 Plan and states he is following his eating plan approximately 60% of the time. Luke Larsen states he is doing 0 minutes 0 times per week.  Today's visit was #: 12 Starting weight: 393 lbs Starting date: 11/28/2019 Today's weight: 353 lbs Today's date: 07/02/2020 Total lbs lost to date: 40 Total lbs lost since last in-office visit: 4  Interim History: Luke Larsen reports that he recently re-injured his back and he is going to be getting a spinal cord stimulator placed in November. He states that his eating has been "horrible".   Subjective:   1. Type 2 diabetes mellitus with hyperlipidemia (Luke Larsen) Luke Larsen is not on medications currently and he reports feeling "shakey" sometimes, but his blood sugars remains in normal range.  2. Vitamin D deficiency Luke Larsen is not currently on Vit D. His energy level is lower than normal due to back pain.  Assessment/Plan:   1. Type 2 diabetes mellitus with hyperlipidemia (Luke Larsen) Good blood sugar control is important to decrease the likelihood of diabetic complications such as nephropathy, neuropathy, limb loss, blindness, coronary artery disease, and death. Intensive lifestyle modification including diet, exercise and weight loss are the first line of treatment for diabetes. We will recheck labs at his next visit, and Luke Larsen will continue to follow up as directed.  2. Vitamin D deficiency Low Vitamin D level contributes to fatigue and are associated with obesity, breast, and colon cancer. We will recheck labs at his next visit. Luke Larsen will follow-up for routine testing of Vitamin D, at least 2-3 times per year to avoid over-replacement.  3. Class 3 severe obesity with serious comorbidity and body mass index (BMI) of 45.0 to 49.9 in adult, unspecified obesity type (Luke Larsen) Luke Larsen is currently in  the action stage of change. As such, his goal is to continue with weight loss efforts. He has agreed to the Category 4 Plan.   We will recheck labs at his next visit.  Exercise goals: No exercise has been prescribed at this time.  Behavioral modification strategies: decreasing eating out and meal planning and cooking strategies.  Luke Larsen has agreed to follow-up with our clinic in 2 weeks. He was informed of the importance of frequent follow-up visits to maximize his success with intensive lifestyle modifications for his multiple health conditions.   Objective:   Blood pressure 122/63, pulse 70, temperature 98 F (36.7 C), temperature source Oral, height 6' (1.829 m), weight (!) 353 lb (160.1 kg), SpO2 95 %. Body mass index is 47.88 kg/m.  General: Cooperative, alert, well developed, in no acute distress. HEENT: Conjunctivae and lids unremarkable. Cardiovascular: Regular rhythm.  Lungs: Normal work of breathing. Neurologic: No focal deficits.   Lab Results  Component Value Date   CREATININE 1.03 03/05/2020   BUN 17 03/05/2020   NA 145 (H) 03/05/2020   K 4.5 03/05/2020   CL 103 03/05/2020   CO2 22 03/05/2020   Lab Results  Component Value Date   ALT 22 03/05/2020   AST 20 03/05/2020   ALKPHOS 74 03/05/2020   BILITOT 0.3 03/05/2020   Lab Results  Component Value Date   HGBA1C 5.5 03/05/2020   HGBA1C 7.3 (H) 11/28/2019   HGBA1C 6.0 (H) 02/08/2013   Lab Results  Component Value Date   INSULIN 32.2 (H) 03/05/2020   INSULIN 44.6 (H) 11/28/2019  Lab Results  Component Value Date   TSH 3.710 03/05/2020   Lab Results  Component Value Date   CHOL 161 03/05/2020   HDL 39 (L) 03/05/2020   LDLCALC 100 (H) 03/05/2020   TRIG 123 03/05/2020   Lab Results  Component Value Date   WBC 7.6 11/28/2019   HGB 15.4 11/28/2019   HCT 44.9 11/28/2019   MCV 92 11/28/2019   PLT 203 11/28/2019   No results found for: IRON, TIBC, FERRITIN  Attestation Statements:   Reviewed by  clinician on day of visit: allergies, medications, problem list, medical history, surgical history, family history, social history, and previous encounter notes.  Time spent on visit including pre-visit chart review and post-visit care and charting was 30 minutes.    Luke Larsen, am acting as transcriptionist for [employer], PA-C.  I have reviewed the above documentation for accuracy and completeness, and I agree with the above. Luke Potash, PA-C

## 2020-07-16 ENCOUNTER — Ambulatory Visit (INDEPENDENT_AMBULATORY_CARE_PROVIDER_SITE_OTHER): Payer: BC Managed Care – PPO | Admitting: Physician Assistant

## 2020-07-16 ENCOUNTER — Encounter (INDEPENDENT_AMBULATORY_CARE_PROVIDER_SITE_OTHER): Payer: Self-pay | Admitting: Physician Assistant

## 2020-07-16 ENCOUNTER — Other Ambulatory Visit: Payer: Self-pay

## 2020-07-16 VITALS — BP 130/74 | HR 69 | Temp 97.5°F | Ht 72.0 in | Wt 359.0 lb

## 2020-07-16 DIAGNOSIS — Z6841 Body Mass Index (BMI) 40.0 and over, adult: Secondary | ICD-10-CM

## 2020-07-16 DIAGNOSIS — Z9189 Other specified personal risk factors, not elsewhere classified: Secondary | ICD-10-CM

## 2020-07-16 DIAGNOSIS — E7849 Other hyperlipidemia: Secondary | ICD-10-CM | POA: Diagnosis not present

## 2020-07-16 DIAGNOSIS — E1169 Type 2 diabetes mellitus with other specified complication: Secondary | ICD-10-CM

## 2020-07-16 DIAGNOSIS — E785 Hyperlipidemia, unspecified: Secondary | ICD-10-CM

## 2020-07-16 DIAGNOSIS — E559 Vitamin D deficiency, unspecified: Secondary | ICD-10-CM

## 2020-07-16 NOTE — Progress Notes (Signed)
Chief Complaint:   OBESITY Luke Larsen is here to discuss his progress with his obesity treatment plan along with follow-up of his obesity related diagnoses. Luke Larsen is on the Category 4 Plan and states he is following his eating plan approximately 45% of the time. Luke Larsen states he is doing 0 minutes 0 times per week.  Today's visit was #: 49 Starting weight: 393 lbs Starting date: 11/28/2019 Today's weight: 359 lbs Today's date: 07/16/2020 Total lbs lost to date: 34 Total lbs lost since last in-office visit: 0  Interim History: Luke Larsen states that he has had a rough time the last few weeks. His niece passed away and his mom broke her hip. He has to get under 350 lbs by November to get a stimulator in his back for his back pain. He has been choosing burgers, fries, and a soda due to his stress level.  Subjective:   1. Type 2 diabetes mellitus with hyperlipidemia ([place]) Luke Larsen is not on medications, and he is due for labs. He reports polyphagia.  2. Other hyperlipidemia Luke Larsen is not on medications. He has not been exercising and he is due for labs.  3. Vitamin D deficiency Luke Larsen is not on medications, and he reports fatigue intermittently. He is due for labs.  4. At risk for heart disease Luke Larsen is at a higher than average risk for cardiovascular disease due to obesity.   Assessment/Plan:   1. Type 2 diabetes mellitus with hyperlipidemia ([facility]) Good blood sugar control is important to decrease the likelihood of diabetic complications such as nephropathy, neuropathy, limb loss, blindness, coronary artery disease, and death. Intensive lifestyle modification including diet, exercise and weight loss are the first line of treatment for diabetes. We will check labs today and continue to monitor A1c. Luke Larsen will consider GLP-1.  - Comprehensive metabolic panel - Hemoglobin A1c - Insulin, random - Microalbumin, urine  2. Other hyperlipidemia Cardiovascular risk and specific lipid/LDL goals  reviewed. We discussed several lifestyle modifications today and Luke Larsen will continue his meal plan, and will continue to work on exercise and weight loss efforts. We will check labs today. Orders and follow up as documented in patient record.   Counseling Intensive lifestyle modifications are the first line treatment for this issue.  Dietary changes: Increase soluble fiber. Decrease simple carbohydrates.  Exercise changes: Moderate to vigorous-intensity aerobic activity 150 minutes per week if tolerated.  Lipid-lowering medications: see documented in medical record.  - Lipid panel  3. Vitamin D deficiency Low Vitamin D level contributes to fatigue and are associated with obesity, breast, and colon cancer. We will check labs today and Luke Larsen will follow-up for routine testing of Vitamin D, at least 2-3 times per year to avoid over-replacement.  - VITAMIN D 25 Hydroxy (Vit-D Deficiency, Fractures)  4. At risk for heart disease Luke Larsen was given approximately 15 minutes of coronary artery disease prevention counseling today. He is 64 y.o. male and has risk factors for heart disease including obesity. We discussed intensive lifestyle modifications today with an emphasis on specific weight loss instructions and strategies.   Repetitive spaced learning was employed today to elicit superior memory formation and behavioral change.  5. Class 3 severe obesity with serious comorbidity and body mass index (BMI) of 45.0 to 49.9 in adult, unspecified obesity type ([facility]) Luke Larsen is currently in the action stage of change. As such, his goal is to continue with weight loss efforts. He has agreed to the Category 4 Plan.   We discussed GLP-1.  Exercise goals: No exercise has been prescribed at this time.  Behavioral modification strategies: decreasing eating out and keeping healthy foods in the home.  Luke Larsen has agreed to follow-up with our clinic in 3 weeks. He was informed of the importance of frequent  follow-up visits to maximize his success with intensive lifestyle modifications for his multiple health conditions.   Luke Larsen was informed we would discuss his lab results at his next visit unless there is a critical issue that needs to be addressed sooner. Luke Larsen agreed to keep his next visit at the agreed upon time to discuss these results.  Objective:   Blood pressure 130/74, pulse 69, temperature (!) 97.5 F (36.4 C), height 6' (1.829 m), weight (!) 359 lb (162.8 kg), SpO2 95 %. Body mass index is 48.69 kg/m.  General: Cooperative, alert, well developed, in no acute distress. HEENT: Conjunctivae and lids unremarkable. Cardiovascular: Regular rhythm.  Lungs: Normal work of breathing. Neurologic: No focal deficits.   Lab Results  Component Value Date   CREATININE 1.03 03/05/2020   BUN 17 03/05/2020   NA 145 (H) 03/05/2020   K 4.5 03/05/2020   CL 103 03/05/2020   CO2 22 03/05/2020   Lab Results  Component Value Date   ALT 22 03/05/2020   AST 20 03/05/2020   ALKPHOS 74 03/05/2020   BILITOT 0.3 03/05/2020   Lab Results  Component Value Date   HGBA1C 5.5 03/05/2020   HGBA1C 7.3 (H) 11/28/2019   HGBA1C 6.0 (H) 02/08/2013   Lab Results  Component Value Date   INSULIN 32.2 (H) 03/05/2020   INSULIN 44.6 (H) 11/28/2019   Lab Results  Component Value Date   TSH 3.710 03/05/2020   Lab Results  Component Value Date   CHOL 161 03/05/2020   HDL 39 (L) 03/05/2020   LDLCALC 100 (H) 03/05/2020   TRIG 123 03/05/2020   Lab Results  Component Value Date   WBC 7.6 11/28/2019   HGB 15.4 11/28/2019   HCT 44.9 11/28/2019   MCV 92 11/28/2019   PLT 203 11/28/2019   No results found for: IRON, TIBC, FERRITIN  Attestation Statements:   Reviewed by clinician on day of visit: allergies, medications, problem list, medical history, surgical history, family history, social history, and previous encounter notes.   Wilhemena Durie, am acting as transcriptionist for [employer],  PA-C.  I have reviewed the above documentation for accuracy and completeness, and I agree with the above. Abby Potash, PA-C

## 2020-07-17 LAB — COMPREHENSIVE METABOLIC PANEL
ALT: 24 IU/L (ref 0–44)
AST: 20 IU/L (ref 0–40)
Albumin/Globulin Ratio: 2.1 (ref 1.2–2.2)
Albumin: 4.4 g/dL (ref 3.8–4.8)
Alkaline Phosphatase: 87 IU/L (ref 44–121)
BUN/Creatinine Ratio: 14 (ref 10–24)
BUN: 15 mg/dL (ref 8–27)
Bilirubin Total: 0.4 mg/dL (ref 0.0–1.2)
CO2: 25 mmol/L (ref 20–29)
Calcium: 9.5 mg/dL (ref 8.6–10.2)
Chloride: 102 mmol/L (ref 96–106)
Creatinine, Ser: 1.07 mg/dL (ref 0.76–1.27)
GFR calc Af Amer: 86 mL/min/{1.73_m2} (ref >59)
GFR calc non Af Amer: 74 mL/min/{1.73_m2} (ref >59)
Globulin, Total: 2.1 g/dL (ref 1.5–4.5)
Glucose: 98 mg/dL (ref 65–99)
Potassium: 4.4 mmol/L (ref 3.5–5.2)
Sodium: 140 mmol/L (ref 134–144)
Total Protein: 6.5 g/dL (ref 6.0–8.5)

## 2020-07-17 LAB — MICROALBUMIN, URINE: Microalbumin, Urine: 3.2 ug/mL

## 2020-07-17 LAB — VITAMIN D 25 HYDROXY (VIT D DEFICIENCY, FRACTURES): Vit D, 25-Hydroxy: 27.7 ng/mL — ABNORMAL LOW (ref 30.0–100.0)

## 2020-07-17 LAB — LIPID PANEL
Chol/HDL Ratio: 4.5 ratio (ref 0.0–5.0)
Cholesterol, Total: 193 mg/dL (ref 100–199)
HDL: 43 mg/dL (ref >39)
LDL Chol Calc (NIH): 124 mg/dL — ABNORMAL HIGH (ref 0–99)
Triglycerides: 148 mg/dL (ref 0–149)
VLDL Cholesterol Cal: 26 mg/dL (ref 5–40)

## 2020-07-17 LAB — HEMOGLOBIN A1C
Est. average glucose Bld gHb Est-mCnc: 128 mg/dL
Hgb A1c MFr Bld: 6.1 % — ABNORMAL HIGH (ref 4.8–5.6)

## 2020-07-17 LAB — INSULIN, RANDOM: INSULIN: 51 u[IU]/mL — ABNORMAL HIGH (ref 2.6–24.9)

## 2020-08-06 ENCOUNTER — Other Ambulatory Visit: Payer: Self-pay

## 2020-08-06 ENCOUNTER — Ambulatory Visit (INDEPENDENT_AMBULATORY_CARE_PROVIDER_SITE_OTHER): Payer: BC Managed Care – PPO | Admitting: Physician Assistant

## 2020-08-06 VITALS — BP 146/77 | HR 75 | Temp 97.9°F | Ht 72.0 in | Wt 355.0 lb

## 2020-08-06 DIAGNOSIS — E785 Hyperlipidemia, unspecified: Secondary | ICD-10-CM

## 2020-08-06 DIAGNOSIS — E1169 Type 2 diabetes mellitus with other specified complication: Secondary | ICD-10-CM | POA: Diagnosis not present

## 2020-08-06 DIAGNOSIS — Z9189 Other specified personal risk factors, not elsewhere classified: Secondary | ICD-10-CM | POA: Diagnosis not present

## 2020-08-06 DIAGNOSIS — E66813 Obesity, class 3: Secondary | ICD-10-CM

## 2020-08-06 DIAGNOSIS — I10 Essential (primary) hypertension: Secondary | ICD-10-CM

## 2020-08-06 DIAGNOSIS — Z6841 Body Mass Index (BMI) 40.0 and over, adult: Secondary | ICD-10-CM

## 2020-08-06 DIAGNOSIS — E559 Vitamin D deficiency, unspecified: Secondary | ICD-10-CM | POA: Diagnosis not present

## 2020-08-07 MED ORDER — VITAMIN D (ERGOCALCIFEROL) 1.25 MG (50000 UNIT) PO CAPS: 50000.0000 [IU] | ORAL_CAPSULE | ORAL | 0 refills | Status: DC

## 2020-08-07 NOTE — Progress Notes (Signed)
Chief Complaint:   OBESITY Luke Larsen is here to discuss his progress with his obesity treatment plan along with follow-up of his obesity related diagnoses. Luke Larsen is on the Category 4 Plan and states he is following his eating plan approximately 60% of the time. Luke Larsen states he is walking 30 minutes 7 times per week.  Today's visit was #: 14 Starting weight: 393 lbs Starting date: 11/28/2019 Today's weight: 355 lbs Today's date: 08/06/2020 Total lbs lost to date: 38 Total lbs lost since last in-office visit: 4  Interim History: Luke Larsen states that he was down to 345 lbs and then gained 10 lbs back over the last week after indulging in sodas. He feels more in control and states he will decrease his soda intake.  Subjective:   Essential hypertension. Luke Larsen has not taken blood pressure medications this a.m. No  chest pain or headache. He is managed by his PCP.  BP Readings from Last 3 Encounters:  08/06/20 (!) 146/77  07/16/20 130/74  07/02/20 122/63   Lab Results  Component Value Date   CREATININE 1.07 07/16/2020   CREATININE 1.03 03/05/2020   CREATININE 0.86 11/28/2019   Type 2 diabetes mellitus with hyperlipidemia ([place]). Luke Larsen is on no medications. Last A1c has worsened.   Lab Results  Component Value Date   HGBA1C 6.1 (H) 07/16/2020   HGBA1C 5.5 03/05/2020   HGBA1C 7.3 (H) 11/28/2019   Lab Results  Component Value Date   LDLCALC 124 (H) 07/16/2020   CREATININE 1.07 07/16/2020   Lab Results  Component Value Date   INSULIN 51.0 (H) 07/16/2020   INSULIN 32.2 (H) 03/05/2020   INSULIN 44.6 (H) 11/28/2019   Vitamin D deficiency. Luke Larsen is on Vitamin D supplementation daily.   Ref. Range 07/16/2020 12:10  Vitamin D, 25-Hydroxy Latest Ref Range: 30.0 - 100.0 ng/mL 27.7 (L)   At risk for hypoglycemia. Luke Larsen is at increased risk for hypoglycemia due to changes in diet, diagnosis of diabetes, and/or insulin use.   Assessment/Plan:   Essential hypertension. Luke Larsen  is working on healthy weight loss and exercise to improve blood pressure control. We will watch for signs of hypotension as he continues his lifestyle modifications. Luke Larsen will continue his medication as directed and follow-up with his PCP as scheduled.  Type 2 diabetes mellitus with hyperlipidemia ([place]). Good blood sugar control is important to decrease the likelihood of diabetic complications such as nephropathy, neuropathy, limb loss, blindness, coronary artery disease, and death. Intensive lifestyle modification including diet, exercise and weight loss are the first line of treatment for diabetes. Luke Larsen will continue weight loss and decrease simple carbs, especially soda.  Vitamin D deficiency. Low Vitamin D level contributes to fatigue and are associated with obesity, breast, and colon cancer. He will start Vitamin D, Ergocalciferol, (DRISDOL) 1.25 MG (50000 UNIT) CAPS capsule every week #4 with 0 refills and will follow-up for routine testing of Vitamin D, at least 2-3 times per year to avoid over-replacement.   At risk for hypoglycemia. Luke Larsen was given approximately 15 minutes of counseling today regarding prevention of hypoglycemia. He was advised of symptoms of hypoglycemia. Luke Larsen was instructed to avoid skipping meals, eat regular protein rich meals and schedule low calorie snacks as needed.   Repetitive spaced learning was employed today to elicit superior memory formation and behavioral change.  Class 3 severe obesity with serious comorbidity and body mass index (BMI) of 45.0 to 49.9 in adult, unspecified obesity type ([place]).  Luke Larsen is currently in the  action stage of change. As such, his goal is to continue with weight loss efforts. He has agreed to the Category 4 Plan.   Exercise goals: For substantial health benefits, adults should do at least 150 minutes (2 hours and 30 minutes) a week of moderate-intensity, or 75 minutes (1 hour and 15 minutes) a week of vigorous-intensity aerobic  physical activity, or an equivalent combination of moderate- and vigorous-intensity aerobic activity. Aerobic activity should be performed in episodes of at least 10 minutes, and preferably, it should be spread throughout the week.  Behavioral modification strategies: decreasing simple carbohydrates and meal planning and cooking strategies.  Luke Larsen has agreed to follow-up with our clinic in 3 weeks. He was informed of the importance of frequent follow-up visits to maximize his success with intensive lifestyle modifications for his multiple health conditions.   Objective:   Blood pressure (!) 146/77, pulse 75, temperature 97.9 F (36.6 C), height 6' (1.829 m), weight (!) 355 lb (161 kg), SpO2 95 %. Body mass index is 48.15 kg/m.  General: Cooperative, alert, well developed, in no acute distress. HEENT: Conjunctivae and lids unremarkable. Cardiovascular: Regular rhythm.  Lungs: Normal work of breathing. Neurologic: No focal deficits.   Lab Results  Component Value Date   CREATININE 1.07 07/16/2020   BUN 15 07/16/2020   NA 140 07/16/2020   K 4.4 07/16/2020   CL 102 07/16/2020   CO2 25 07/16/2020   Lab Results  Component Value Date   ALT 24 07/16/2020   AST 20 07/16/2020   ALKPHOS 87 07/16/2020   BILITOT 0.4 07/16/2020   Lab Results  Component Value Date   HGBA1C 6.1 (H) 07/16/2020   HGBA1C 5.5 03/05/2020   HGBA1C 7.3 (H) 11/28/2019   HGBA1C 6.0 (H) 02/08/2013   Lab Results  Component Value Date   INSULIN 51.0 (H) 07/16/2020   INSULIN 32.2 (H) 03/05/2020   INSULIN 44.6 (H) 11/28/2019   Lab Results  Component Value Date   TSH 3.710 03/05/2020   Lab Results  Component Value Date   CHOL 193 07/16/2020   HDL 43 07/16/2020   LDLCALC 124 (H) 07/16/2020   TRIG 148 07/16/2020   CHOLHDL 4.5 07/16/2020   Lab Results  Component Value Date   WBC 7.6 11/28/2019   HGB 15.4 11/28/2019   HCT 44.9 11/28/2019   MCV 92 11/28/2019   PLT 203 11/28/2019   No results found  for: IRON, TIBC, FERRITIN  Attestation Statements:   Reviewed by clinician on day of visit: allergies, medications, problem list, medical history, surgical history, family history, social history, and previous encounter notes.  IMichaelene Song, am acting as transcriptionist for Abby Potash, PA-C   I have reviewed the above documentation for accuracy and completeness, and I agree with the above. Abby Potash, PA-C

## 2020-08-26 ENCOUNTER — Encounter (INDEPENDENT_AMBULATORY_CARE_PROVIDER_SITE_OTHER): Payer: Self-pay | Admitting: Family Medicine

## 2020-08-26 ENCOUNTER — Ambulatory Visit (INDEPENDENT_AMBULATORY_CARE_PROVIDER_SITE_OTHER): Payer: BC Managed Care – PPO | Admitting: Family Medicine

## 2020-08-26 ENCOUNTER — Other Ambulatory Visit: Payer: Self-pay

## 2020-08-26 VITALS — BP 182/79 | HR 85 | Temp 98.2°F | Ht 72.0 in | Wt 339.0 lb

## 2020-08-26 DIAGNOSIS — M549 Dorsalgia, unspecified: Secondary | ICD-10-CM

## 2020-08-26 DIAGNOSIS — Z6841 Body Mass Index (BMI) 40.0 and over, adult: Secondary | ICD-10-CM

## 2020-08-26 DIAGNOSIS — Z9189 Other specified personal risk factors, not elsewhere classified: Secondary | ICD-10-CM | POA: Diagnosis not present

## 2020-08-26 DIAGNOSIS — E1169 Type 2 diabetes mellitus with other specified complication: Secondary | ICD-10-CM | POA: Diagnosis not present

## 2020-08-26 DIAGNOSIS — I10 Essential (primary) hypertension: Secondary | ICD-10-CM | POA: Diagnosis not present

## 2020-08-26 DIAGNOSIS — G8929 Other chronic pain: Secondary | ICD-10-CM

## 2020-08-27 ENCOUNTER — Ambulatory Visit (INDEPENDENT_AMBULATORY_CARE_PROVIDER_SITE_OTHER): Payer: BC Managed Care – PPO | Admitting: Physician Assistant

## 2020-08-27 NOTE — Progress Notes (Signed)
Chief Complaint:   OBESITY Luke Larsen is here to discuss his progress with his obesity treatment plan along with follow-up of his obesity related diagnoses.   Today's visit was #: 15 Starting weight: 393 lbs Starting date: 11/28/2019 Today's weight: 339 lbs Today's date: 08/26/2020 Total lbs lost to date: 54 lbs Body mass index is 45.98 kg/m.  Total weight loss percentage to date: -13.74%  Interim History: Luke Larsen has decreased his sodas to 1 every 2-3 days.  He is getting a spinal cord stimulator placed next week (weight needed to be under 350 pounds).  Nutrition Plan: the Category 4 Plan.  Anti-obesity medications: None.  Hunger is moderately controlled controlled. Cravings are moderately controlled controlled.  Activity: Walking/increased activity for 25-30 minutes 3-4 times per week. Sleep: Sleep is not restful due to pain.  Assessment/Plan:   1. Chronic back pain He will be getting a spinal cord stimulator placed. We will continue to monitor symptoms as they relate to his weight loss journey. This issue directly impacts care plan for optimization of BMI and metabolic health as it impacts the patient's ability to make lifestyle changes.  2. Type 2 diabetes mellitus with other specified complication, without long-term current use of insulin (HCC) Diabetes Mellitus: stable. Medication: None (he declines). Issues reviewed with him: blood sugar goals, complications of diabetes mellitus, hypoglycemia prevention and treatment, exercise, and nutrition.  Lab Results  Component Value Date   HGBA1C 6.1 (H) 07/16/2020   HGBA1C 5.5 03/05/2020   HGBA1C 7.3 (H) 11/28/2019   Lab Results  Component Value Date   LDLCALC 124 (H) 07/16/2020   CREATININE 1.07 07/16/2020   3. Essential hypertension Not at goal.  He did not take his medication today and he is in pain.  He is salt-sensitive as well.  Medications: Diovan 320 mg daily.   Plan: Avoid buying foods that are: processed, frozen, or  prepackaged to avoid excess salt. Red flags reviewed.  BP Readings from Last 3 Encounters:  08/26/20 (!) 182/79  08/06/20 (!) 146/77  07/16/20 130/74   Lab Results  Component Value Date   CREATININE 1.07 07/16/2020   4. At risk for heart disease Doniel was given approximately 15 minutes of coronary artery disease prevention counseling today. He is 64 y.o. male and has risk factors for heart disease including obesity and HTN. We discussed intensive lifestyle modifications today with an emphasis on specific weight loss instructions and strategies. Repetitive spaced learning was employed today to elicit superior memory formation and behavioral change.  5. Class 3 severe obesity with serious comorbidity and body mass index (BMI) of 45.0 to 49.9 in adult, unspecified obesity type ([place])  Course: Luke Larsen is currently in the action stage of change. As such, his goal is to continue with weight loss efforts.   Nutrition goals: He has agreed to the Category 4 Plan.   Exercise goals: For substantial health benefits, adults should do at least 150 minutes (2 hours and 30 minutes) a week of moderate-intensity, or 75 minutes (1 hour and 15 minutes) a week of vigorous-intensity aerobic physical activity, or an equivalent combination of moderate- and vigorous-intensity aerobic activity. Aerobic activity should be performed in episodes of at least 10 minutes, and preferably, it should be spread throughout the week.  Behavioral modification strategies: increasing water intake and decreasing sodium intake.  Aubrey has agreed to follow-up with our clinic in 3 weeks. He was informed of the importance of frequent follow-up visits to maximize his success with intensive lifestyle  modifications for his multiple health conditions.   Objective:   Blood pressure (!) 182/79, pulse 85, temperature 98.2 F (36.8 C), temperature source Oral, height 6' (1.829 m), weight (!) 339 lb (153.8 kg), SpO2 97 %. Body mass index is  45.98 kg/m.  General: Cooperative, alert, well developed, in no acute distress. HEENT: Conjunctivae and lids unremarkable. Cardiovascular: Regular rhythm.  Lungs: Normal work of breathing. Neurologic: No focal deficits.   Lab Results  Component Value Date   CREATININE 1.07 07/16/2020   BUN 15 07/16/2020   NA 140 07/16/2020   K 4.4 07/16/2020   CL 102 07/16/2020   CO2 25 07/16/2020   Lab Results  Component Value Date   ALT 24 07/16/2020   AST 20 07/16/2020   ALKPHOS 87 07/16/2020   BILITOT 0.4 07/16/2020   Lab Results  Component Value Date   HGBA1C 6.1 (H) 07/16/2020   HGBA1C 5.5 03/05/2020   HGBA1C 7.3 (H) 11/28/2019   HGBA1C 6.0 (H) 02/08/2013   Lab Results  Component Value Date   INSULIN 51.0 (H) 07/16/2020   INSULIN 32.2 (H) 03/05/2020   INSULIN 44.6 (H) 11/28/2019   Lab Results  Component Value Date   TSH 3.710 03/05/2020   Lab Results  Component Value Date   CHOL 193 07/16/2020   HDL 43 07/16/2020   LDLCALC 124 (H) 07/16/2020   TRIG 148 07/16/2020   CHOLHDL 4.5 07/16/2020   Lab Results  Component Value Date   WBC 7.6 11/28/2019   HGB 15.4 11/28/2019   HCT 44.9 11/28/2019   MCV 92 11/28/2019   PLT 203 11/28/2019   Attestation Statements:   Reviewed by clinician on day of visit: allergies, medications, problem list, medical history, surgical history, family history, social history, and previous encounter notes.  I, Water quality scientist, CMA, am acting as transcriptionist for Briscoe Deutscher, DO  I have reviewed the above documentation for accuracy and completeness, and I agree with the above. Briscoe Deutscher, DO

## 2020-09-02 ENCOUNTER — Encounter (INDEPENDENT_AMBULATORY_CARE_PROVIDER_SITE_OTHER): Payer: Self-pay

## 2020-09-07 ENCOUNTER — Other Ambulatory Visit (INDEPENDENT_AMBULATORY_CARE_PROVIDER_SITE_OTHER): Payer: Self-pay | Admitting: Physician Assistant

## 2020-09-07 DIAGNOSIS — E559 Vitamin D deficiency, unspecified: Secondary | ICD-10-CM

## 2020-09-16 ENCOUNTER — Ambulatory Visit (INDEPENDENT_AMBULATORY_CARE_PROVIDER_SITE_OTHER): Payer: BC Managed Care – PPO | Admitting: Physician Assistant

## 2020-09-20 ENCOUNTER — Other Ambulatory Visit: Payer: Self-pay | Admitting: Orthopedic Surgery

## 2020-09-20 DIAGNOSIS — M5416 Radiculopathy, lumbar region: Secondary | ICD-10-CM

## 2020-09-27 ENCOUNTER — Ambulatory Visit
Admission: RE | Admit: 2020-09-27 | Discharge: 2020-09-27 | Disposition: A | Discharge status: Home / Self Care | Payer: BC Managed Care – PPO | Source: Ambulatory Visit | Attending: Orthopedic Surgery | Admitting: Orthopedic Surgery

## 2020-09-27 DIAGNOSIS — M5416 Radiculopathy, lumbar region: Secondary | ICD-10-CM

## 2020-09-27 MED ORDER — IOPAMIDOL (ISOVUE-M 200) INJECTION 41%
1.0000 mL | Freq: Once | INTRAMUSCULAR | Status: AC
  Administered 2020-09-27: 1 mL via EPIDURAL

## 2020-09-27 MED ORDER — METHYLPREDNISOLONE ACETATE 40 MG/ML INJ SUSP (RADIOLOG
120.0000 mg | Freq: Once | INTRAMUSCULAR | Status: AC
  Administered 2020-09-27: 120 mg via EPIDURAL

## 2020-09-27 NOTE — Discharge Instructions (Signed)

## 2020-10-09 ENCOUNTER — Other Ambulatory Visit: Payer: Self-pay | Admitting: Orthopedic Surgery

## 2020-11-29 ENCOUNTER — Encounter (HOSPITAL_COMMUNITY): Payer: Self-pay

## 2020-11-29 NOTE — Pre-Procedure Instructions (Signed)
Luke Larsen  11/29/2020    Your procedure is scheduled on Thurs., Feb. 17, 2022 from 7:30AM-12:00PM  Report to [facility] Entrance "A" at 5:30AM  Call this number if you have problems the morning of surgery:  [phone]   Remember:  Do not eat after midnight on Feb. 16th  You may drink clear liquids until 3 hours (4:30AM) prior to surgery time .  Clear liquids allowed are:  Water, Juice (non-citric and without pulp - diabetics please choose diet or no sugar options), Carbonated beverages - (diabetics please choose diet or no sugar options), Clear Tea, Black Coffee only (no creamer, milk or cream including half and half), Plain Jell-O only (diabetics please choose diet or no sugar options), Gatorade (diabetics please choose diet or no sugar options) and Plain Popsicles only   Enhanced Recovery after Surgery for Orthopedics Enhanced Recovery after Surgery is a protocol used to improve the stress on your body and your recovery after surgery.  Patient Instructions  . The day of surgery (if you have diabetes):  o Drink ONE small bottle of Water by __4:30AM___ the morning of surgery o This bottle was given to you during your hospital  pre-op appointment visit.  o Nothing else to drink after completing the  Small bottle of water.         If you have questions, please contact your surgeon's office.   Take these medicines the morning of surgery with A SIP OF WATER: If Needed: Oxycodone HCl  tiZANidine (ZANAFLEX)  As of today, STOP taking all Aspirin (unless instructed by your doctor) and Other Aspirin containing products, Vitamins, Fish oils, and Herbal medications. Also stop all NSAIDS i.e. Advil, Ibuprofen, Motrin, Aleve, Anaprox, Naproxen, BC, Goody Powders, and all Supplements.  How to Manage Your Diabetes Before and After Surgery  How do I manage my blood sugar before surgery? o Check your blood sugar the morning of your surgery when you wake up and every 2 hours  until you get to the Short Stay unit. . If your blood sugar is less than 70 mg/dL, you will need to treat for low blood sugar: o Do not take insulin. o Treat a low blood sugar (less than 70 mg/dL) with  cup of clear juice (cranberry or apple), 4 glucose tablets, OR glucose gel. Recheck blood sugar in 15 minutes after treatment (to make sure it is greater than 70 mg/dL). If your blood sugar is not greater than 70 mg/dL on recheck, call [phone] o  for further instructions.  If your CBG is greater than 220 mg/dL,inform the staff upon arrival to Short Stay.  . If you are admitted to the hospital after surgery: o Your blood sugar will be checked by the staff and you will probably be given insulin after surgery (instead of oral diabetes medicines) to make sure you have good blood sugar levels. o The goal for blood sugar control after surgery is 80-180 mg/dL.  Reviewed and Endorsed by [facility] Patient Education Committee, August 2015  No Smoking of any kind, Tobacco/Vaping, or Alcohol products 24 hours prior to your procedure. If you use a Cpap at night, you may bring all equipment for your overnight stay.   Day of Surgery:  Do not wear jewelry.  Do not wear lotions, powders, colognes, or deodorant.  Do not shave 48 hours prior to surgery.  Men may shave face and neck.  Do not bring valuables to the hospital.  [facility] is not responsible  for any belongings or valuables.  Contacts, dentures or bridgework may not be worn into surgery.   For patients admitted to the hospital, discharge time will be determined by your treatment team.  Patients discharged the day of surgery will not be allowed to drive home, and someone age 63 and over needs to stay with them for 24 hours.   Special instructions:   - Preparing For Surgery  Before surgery, you can play an important role. Because skin is not sterile, your skin needs to be as free of germs as possible. You can reduce the  number of germs on your skin by washing with CHG (chlorahexidine gluconate) Soap before surgery.  CHG is an antiseptic cleaner which kills germs and bonds with the skin to continue killing germs even after washing.    Oral Hygiene is also important to reduce your risk of infection.  Remember - BRUSH YOUR TEETH THE MORNING OF SURGERY WITH YOUR REGULAR TOOTHPASTE  Please do not use if you have an allergy to CHG or antibacterial soaps. If your skin becomes reddened/irritated stop using the CHG.  Do not shave (including legs and underarms) for at least 48 hours prior to first CHG shower. It is OK to shave your face.  Please follow these instructions carefully.   1. Shower the NIGHT BEFORE SURGERY and the MORNING OF SURGERY with CHG.   2. If you chose to wash your hair, wash your hair first as usual with your normal shampoo.  3. After you shampoo, rinse your hair and body thoroughly to remove the shampoo.  4. Use CHG as you would any other liquid soap. You can apply CHG directly to the skin and wash gently with a scrungie or a clean washcloth.   5. Apply the CHG Soap to your body ONLY FROM THE NECK DOWN.  Do not use on open wounds or open sores. Avoid contact with your eyes, ears, mouth and genitals (private parts). Wash Face and genitals (private parts)  with your normal soap.  6. Wash thoroughly, paying special attention to the area where your surgery will be performed.  7. Thoroughly rinse your body with warm water from the neck down.  8. DO NOT shower/wash with your normal soap after using and rinsing off the CHG Soap.  9. Pat yourself dry with a CLEAN TOWEL.  10. Wear CLEAN PAJAMAS to bed the night before surgery, wear comfortable clothes the morning of surgery  11. Place CLEAN SHEETS on your bed the night of your first shower and DO NOT SLEEP WITH PETS.  Reminders: Do not apply any deodorants/lotions.  Please wear clean clothes to the hospital/surgery center.   Remember to brush  your teeth WITH YOUR REGULAR TOOTHPASTE.  Please read over the following fact sheets that you were given.

## 2020-12-02 ENCOUNTER — Encounter (HOSPITAL_COMMUNITY): Payer: Self-pay

## 2020-12-02 ENCOUNTER — Encounter (HOSPITAL_COMMUNITY)
Admission: RE | Admit: 2020-12-02 | Discharge: 2020-12-02 | Disposition: A | Discharge status: Home / Self Care | Payer: BC Managed Care – PPO | Source: Ambulatory Visit | Attending: Orthopedic Surgery | Admitting: Orthopedic Surgery

## 2020-12-02 ENCOUNTER — Other Ambulatory Visit: Payer: Self-pay

## 2020-12-02 DIAGNOSIS — Z01812 Encounter for preprocedural laboratory examination: Secondary | ICD-10-CM | POA: Insufficient documentation

## 2020-12-02 HISTORY — DX: Type 2 diabetes mellitus without complications: E11.9

## 2020-12-02 HISTORY — DX: Personal history of urinary calculi: Z87.442

## 2020-12-02 HISTORY — DX: Unspecified osteoarthritis, unspecified site: M19.90

## 2020-12-02 LAB — URINALYSIS, ROUTINE W REFLEX MICROSCOPIC
Bilirubin Urine: NEGATIVE
Glucose, UA: NEGATIVE mg/dL
Hgb urine dipstick: NEGATIVE
Ketones, ur: NEGATIVE mg/dL
Nitrite: NEGATIVE
Protein, ur: NEGATIVE mg/dL
Specific Gravity, Urine: 1.027 (ref 1.005–1.030)
pH: 5 (ref 5.0–8.0)

## 2020-12-02 LAB — CBC WITH DIFFERENTIAL/PLATELET
Abs Immature Granulocytes: 0.02 10*3/uL (ref 0.00–0.07)
Basophils Absolute: 0 10*3/uL (ref 0.0–0.1)
Basophils Relative: 1 %
Eosinophils Absolute: 0.2 10*3/uL (ref 0.0–0.5)
Eosinophils Relative: 3 %
HCT: 44.7 % (ref 39.0–52.0)
Hemoglobin: 15.3 g/dL (ref 13.0–17.0)
Immature Granulocytes: 0 %
Lymphocytes Relative: 27 %
Lymphs Abs: 1.7 10*3/uL (ref 0.7–4.0)
MCH: 31.4 pg (ref 26.0–34.0)
MCHC: 34.2 g/dL (ref 30.0–36.0)
MCV: 91.8 fL (ref 80.0–100.0)
Monocytes Absolute: 0.7 10*3/uL (ref 0.1–1.0)
Monocytes Relative: 11 %
Neutro Abs: 3.6 10*3/uL (ref 1.7–7.7)
Neutrophils Relative %: 58 %
Platelets: 231 10*3/uL (ref 150–400)
RBC: 4.87 MIL/uL (ref 4.22–5.81)
RDW: 12.3 % (ref 11.5–15.5)
WBC: 6.2 10*3/uL (ref 4.0–10.5)
nRBC: 0 % (ref 0.0–0.2)

## 2020-12-02 LAB — APTT: aPTT: 30 seconds (ref 24–36)

## 2020-12-02 LAB — TYPE AND SCREEN
ABO/RH(D): O NEG
Antibody Screen: NEGATIVE

## 2020-12-02 LAB — COMPREHENSIVE METABOLIC PANEL
ALT: 26 U/L (ref 0–44)
AST: 24 U/L (ref 15–41)
Albumin: 4 g/dL (ref 3.5–5.0)
Alkaline Phosphatase: 61 U/L (ref 38–126)
Anion gap: 12 (ref 5–15)
BUN: 17 mg/dL (ref 8–23)
CO2: 22 mmol/L (ref 22–32)
Calcium: 9.1 mg/dL (ref 8.9–10.3)
Chloride: 102 mmol/L (ref 98–111)
Creatinine, Ser: 1.07 mg/dL (ref 0.61–1.24)
GFR, Estimated: >60 mL/min (ref >60)
Glucose, Bld: 99 mg/dL (ref 70–99)
Potassium: 4 mmol/L (ref 3.5–5.1)
Sodium: 136 mmol/L (ref 135–145)
Total Bilirubin: 0.8 mg/dL (ref 0.3–1.2)
Total Protein: 6.8 g/dL (ref 6.5–8.1)

## 2020-12-02 LAB — SURGICAL PCR SCREEN
MRSA, PCR: NEGATIVE
Staphylococcus aureus: NEGATIVE

## 2020-12-02 LAB — PROTIME-INR
INR: 1 (ref 0.8–1.2)
Prothrombin Time: 13 seconds (ref 11.4–15.2)

## 2020-12-02 LAB — GLUCOSE, CAPILLARY: Glucose-Capillary: 103 mg/dL — ABNORMAL HIGH (ref 70–99)

## 2020-12-02 NOTE — Progress Notes (Signed)
PCP:  DR. Harlan Stains Cardiologist:  Denies  EKG:  11/27/20 CXR:  N/A ECHO:  Denies Stress Test:  Denies Cardiac Cath:  Denies  Fasting Blood Sugar-  100-150's Checks Blood Sugar_1__ times a week  OSA/CPAP:  Yes, wears nightly.  Will bring DOS.  ASA/Blood Thinners:  No  Covid test 12/03/20  Anesthesia Review:  No  Patient denies shortness of breath, fever, cough, and chest pain at PAT appointment.  Patient verbalized understanding of instructions provided today at the PAT appointment.  Patient asked to review instructions at home and day of surgery.

## 2020-12-03 ENCOUNTER — Other Ambulatory Visit (HOSPITAL_COMMUNITY)
Admission: RE | Admit: 2020-12-03 | Discharge: 2020-12-03 | Disposition: A | Discharge status: Home / Self Care | Payer: BC Managed Care – PPO | Source: Ambulatory Visit | Attending: Orthopedic Surgery | Admitting: Orthopedic Surgery

## 2020-12-03 DIAGNOSIS — Z20822 Contact with and (suspected) exposure to covid-19: Secondary | ICD-10-CM | POA: Insufficient documentation

## 2020-12-03 DIAGNOSIS — Z01812 Encounter for preprocedural laboratory examination: Secondary | ICD-10-CM | POA: Insufficient documentation

## 2020-12-04 LAB — SARS CORONAVIRUS 2 (TAT 6-24 HRS): SARS Coronavirus 2: NEGATIVE

## 2020-12-04 MED ORDER — DEXTROSE 5 % IV SOLN
3.0000 g | INTRAVENOUS | Status: DC
  Filled 2020-12-04: qty 3000

## 2020-12-04 NOTE — Anesthesia Preprocedure Evaluation (Addendum)
Anesthesia Evaluation  Patient identified by MRN, date of birth, ID band Patient awake    Reviewed: Allergy & Precautions, NPO status , Patient's Chart, lab work & pertinent test results  History of Anesthesia Complications Negative for: history of anesthetic complications  Airway Mallampati: II  TM Distance: >3 FB Neck ROM: Full    Dental no notable dental hx.    Pulmonary sleep apnea ,    Pulmonary exam normal        Cardiovascular hypertension, Pt. on medications Normal cardiovascular exam     Neuro/Psych Anxiety negative neurological ROS     GI/Hepatic Neg liver ROS, PUD,   Endo/Other  diabetes, Type 2Morbid obesity  Renal/GU negative Renal ROS  negative genitourinary   Musculoskeletal  (+) Arthritis ,   Abdominal   Peds  Hematology negative hematology ROS (+)   Anesthesia Other Findings Day of surgery medications reviewed with patient.  Reproductive/Obstetrics negative OB ROS                            Anesthesia Physical Anesthesia Plan  ASA: III  Anesthesia Plan: General   Post-op Pain Management:    Induction: Intravenous  PONV Risk Score and Plan: 3 and Midazolam, Treatment may vary due to age or medical condition, Ondansetron, Dexamethasone and Propofol infusion  Airway Management Planned: Oral ETT  Additional Equipment: Arterial line  Intra-op Plan:   Post-operative Plan: Extubation in OR  Informed Consent: I have reviewed the patients History and Physical, chart, labs and discussed the procedure including the risks, benefits and alternatives for the proposed anesthesia with the patient or authorized representative who has indicated his/her understanding and acceptance.     Dental advisory given  Plan Discussed with: CRNA  Anesthesia Plan Comments:        Anesthesia Quick Evaluation

## 2020-12-05 ENCOUNTER — Inpatient Hospital Stay (HOSPITAL_COMMUNITY): Payer: BC Managed Care – PPO

## 2020-12-05 ENCOUNTER — Inpatient Hospital Stay (HOSPITAL_COMMUNITY): Payer: BC Managed Care – PPO | Admitting: Anesthesiology

## 2020-12-05 ENCOUNTER — Other Ambulatory Visit: Payer: Self-pay

## 2020-12-05 ENCOUNTER — Encounter (HOSPITAL_COMMUNITY): Payer: Self-pay | Admitting: Orthopedic Surgery

## 2020-12-05 ENCOUNTER — Encounter (HOSPITAL_COMMUNITY)
Admission: RE | Disposition: A | Discharge status: Home / Self Care | Payer: Self-pay | Source: Home / Self Care | Attending: Orthopedic Surgery

## 2020-12-05 ENCOUNTER — Inpatient Hospital Stay (HOSPITAL_COMMUNITY)
Admission: RE | Admit: 2020-12-05 | Discharge: 2020-12-06 | DRG: 454 | Disposition: A | Discharge status: Home / Self Care | Payer: BC Managed Care – PPO | Attending: Orthopedic Surgery | Admitting: Orthopedic Surgery

## 2020-12-05 ENCOUNTER — Inpatient Hospital Stay (HOSPITAL_COMMUNITY): Payer: BC Managed Care – PPO | Admitting: Physician Assistant

## 2020-12-05 DIAGNOSIS — E119 Type 2 diabetes mellitus without complications: Secondary | ICD-10-CM | POA: Diagnosis present

## 2020-12-05 DIAGNOSIS — M5410 Radiculopathy, site unspecified: Secondary | ICD-10-CM | POA: Diagnosis present

## 2020-12-05 DIAGNOSIS — Z6841 Body Mass Index (BMI) 40.0 and over, adult: Secondary | ICD-10-CM

## 2020-12-05 DIAGNOSIS — G4730 Sleep apnea, unspecified: Secondary | ICD-10-CM | POA: Diagnosis present

## 2020-12-05 DIAGNOSIS — M4316 Spondylolisthesis, lumbar region: Secondary | ICD-10-CM | POA: Diagnosis present

## 2020-12-05 DIAGNOSIS — Z20822 Contact with and (suspected) exposure to covid-19: Secondary | ICD-10-CM | POA: Diagnosis present

## 2020-12-05 DIAGNOSIS — I10 Essential (primary) hypertension: Secondary | ICD-10-CM | POA: Diagnosis present

## 2020-12-05 DIAGNOSIS — M5416 Radiculopathy, lumbar region: Principal | ICD-10-CM | POA: Diagnosis present

## 2020-12-05 DIAGNOSIS — Z833 Family history of diabetes mellitus: Secondary | ICD-10-CM | POA: Diagnosis not present

## 2020-12-05 DIAGNOSIS — M48061 Spinal stenosis, lumbar region without neurogenic claudication: Secondary | ICD-10-CM | POA: Diagnosis present

## 2020-12-05 DIAGNOSIS — Z419 Encounter for procedure for purposes other than remedying health state, unspecified: Secondary | ICD-10-CM

## 2020-12-05 DIAGNOSIS — Z8249 Family history of ischemic heart disease and other diseases of the circulatory system: Secondary | ICD-10-CM

## 2020-12-05 DIAGNOSIS — Z83438 Family history of other disorder of lipoprotein metabolism and other lipidemia: Secondary | ICD-10-CM

## 2020-12-05 DIAGNOSIS — E559 Vitamin D deficiency, unspecified: Secondary | ICD-10-CM | POA: Diagnosis present

## 2020-12-05 HISTORY — PX: ANTERIOR LAT LUMBAR FUSION: SHX1168

## 2020-12-05 LAB — GLUCOSE, CAPILLARY
Glucose-Capillary: 89 mg/dL (ref 70–99)
Glucose-Capillary: 129 mg/dL — ABNORMAL HIGH (ref 70–99)
Glucose-Capillary: 175 mg/dL — ABNORMAL HIGH (ref 70–99)
Glucose-Capillary: 164 mg/dL — ABNORMAL HIGH (ref 70–99)

## 2020-12-05 LAB — ABO/RH: ABO/RH(D): O NEG

## 2020-12-05 SURGERY — ANTERIOR LATERAL LUMBAR FUSION 1 LEVEL
Anesthesia: General

## 2020-12-05 MED ORDER — DEXAMETHASONE SODIUM PHOSPHATE 10 MG/ML IJ SOLN
INTRAMUSCULAR | Status: AC
  Filled 2020-12-05: qty 1

## 2020-12-05 MED ORDER — ONDANSETRON HCL 4 MG/2ML IJ SOLN: 4.0000 mg | Freq: Four times a day (QID) | INTRAMUSCULAR | Status: DC | PRN

## 2020-12-05 MED ORDER — NALOXONE HCL 0.4 MG/ML IJ SOLN
INTRAMUSCULAR | Status: AC
  Filled 2020-12-05: qty 1

## 2020-12-05 MED ORDER — PHENOL 1.4 % MT LIQD: 1.0000 | OROMUCOSAL | Status: DC | PRN

## 2020-12-05 MED ORDER — ORAL CARE MOUTH RINSE: 15.0000 mL | Freq: Once | OROMUCOSAL | Status: AC

## 2020-12-05 MED ORDER — FENTANYL CITRATE (PF) 100 MCG/2ML IJ SOLN
INTRAMUSCULAR | Status: AC
  Administered 2020-12-05: 50 ug via INTRAVENOUS
  Filled 2020-12-05: qty 2

## 2020-12-05 MED ORDER — HYDROMORPHONE HCL 1 MG/ML IJ SOLN
0.5000 mg | INTRAMUSCULAR | Status: AC | PRN
  Administered 2020-12-05: 0.5 mg via INTRAVENOUS

## 2020-12-05 MED ORDER — BISACODYL 5 MG PO TBEC: 5.0000 mg | DELAYED_RELEASE_TABLET | Freq: Every day | ORAL | Status: DC | PRN

## 2020-12-05 MED ORDER — ALUM & MAG HYDROXIDE-SIMETH 200-200-20 MG/5ML PO SUSP: 30.0000 mL | Freq: Four times a day (QID) | ORAL | Status: DC | PRN

## 2020-12-05 MED ORDER — FENTANYL CITRATE (PF) 250 MCG/5ML IJ SOLN
INTRAMUSCULAR | Status: AC
  Filled 2020-12-05: qty 5

## 2020-12-05 MED ORDER — PROPOFOL 1000 MG/100ML IV EMUL
INTRAVENOUS | Status: AC
  Filled 2020-12-05: qty 100

## 2020-12-05 MED ORDER — MIDAZOLAM HCL 5 MG/5ML IJ SOLN
INTRAMUSCULAR | Status: DC | PRN
  Administered 2020-12-05: 2 mg via INTRAVENOUS

## 2020-12-05 MED ORDER — CHLORHEXIDINE GLUCONATE 0.12 % MT SOLN: 15.0000 mL | Freq: Once | OROMUCOSAL | Status: AC

## 2020-12-05 MED ORDER — PROMETHAZINE HCL 25 MG/ML IJ SOLN: 6.2500 mg | INTRAMUSCULAR | Status: DC | PRN

## 2020-12-05 MED ORDER — HYDROMORPHONE HCL 1 MG/ML IJ SOLN
INTRAMUSCULAR | Status: AC
  Administered 2020-12-05: 0.5 mg via INTRAVENOUS
  Filled 2020-12-05: qty 1

## 2020-12-05 MED ORDER — BUPIVACAINE HCL (PF) 0.25 % IJ SOLN
INTRAMUSCULAR | Status: DC | PRN
  Administered 2020-12-05: 30 mL

## 2020-12-05 MED ORDER — SODIUM CHLORIDE 0.9% FLUSH: 3.0000 mL | INTRAVENOUS | Status: DC | PRN

## 2020-12-05 MED ORDER — ACETAMINOPHEN 325 MG PO TABS: 650.0000 mg | ORAL_TABLET | ORAL | Status: DC | PRN

## 2020-12-05 MED ORDER — ONDANSETRON HCL 4 MG/2ML IJ SOLN
INTRAMUSCULAR | Status: AC
  Filled 2020-12-05: qty 2

## 2020-12-05 MED ORDER — HYDROMORPHONE HCL 1 MG/ML IJ SOLN
INTRAMUSCULAR | Status: AC
  Administered 2020-12-05: 0.5 mg via INTRAVENOUS
  Filled 2020-12-05: qty 2

## 2020-12-05 MED ORDER — SUFENTANIL CITRATE 50 MCG/ML IV SOLN
INTRAVENOUS | Status: DC | PRN
  Administered 2020-12-05: 5 ug via INTRAVENOUS

## 2020-12-05 MED ORDER — SODIUM CHLORIDE 0.9% FLUSH
3.0000 mL | Freq: Two times a day (BID) | INTRAVENOUS | Status: DC
  Administered 2020-12-05: 3 mL via INTRAVENOUS

## 2020-12-05 MED ORDER — OXYCODONE HCL 5 MG/5ML PO SOLN: 5.0000 mg | Freq: Once | ORAL | Status: DC | PRN

## 2020-12-05 MED ORDER — FLEET ENEMA 7-19 GM/118ML RE ENEM: 1.0000 | ENEMA | Freq: Once | RECTAL | Status: DC | PRN

## 2020-12-05 MED ORDER — METHOCARBAMOL 500 MG PO TABS
500.0000 mg | ORAL_TABLET | Freq: Four times a day (QID) | ORAL | Status: DC
  Administered 2020-12-05: 1000 mg via ORAL
  Administered 2020-12-05: 500 mg via ORAL
  Administered 2020-12-05 – 2020-12-06 (×2): 1000 mg via ORAL
  Filled 2020-12-05 (×3): qty 2

## 2020-12-05 MED ORDER — MIDAZOLAM HCL 2 MG/2ML IJ SOLN
INTRAMUSCULAR | Status: AC
  Filled 2020-12-05: qty 2

## 2020-12-05 MED ORDER — ACETAMINOPHEN 650 MG RE SUPP: 650.0000 mg | RECTAL | Status: DC | PRN

## 2020-12-05 MED ORDER — THROMBIN 20000 UNITS EX SOLR
CUTANEOUS | Status: AC
  Filled 2020-12-05: qty 20000

## 2020-12-05 MED ORDER — SUCCINYLCHOLINE CHLORIDE 200 MG/10ML IV SOSY
PREFILLED_SYRINGE | INTRAVENOUS | Status: AC
  Filled 2020-12-05: qty 10

## 2020-12-05 MED ORDER — METHOCARBAMOL 500 MG PO TABS
ORAL_TABLET | ORAL | Status: AC
  Filled 2020-12-05: qty 3

## 2020-12-05 MED ORDER — 0.9 % SODIUM CHLORIDE (POUR BTL) OPTIME
TOPICAL | Status: DC | PRN
  Administered 2020-12-05: 1000 mL

## 2020-12-05 MED ORDER — PROPOFOL 1000 MG/100ML IV EMUL
INTRAVENOUS | Status: AC
  Filled 2020-12-05: qty 300

## 2020-12-05 MED ORDER — KETAMINE HCL 50 MG/5ML IJ SOSY
PREFILLED_SYRINGE | INTRAMUSCULAR | Status: AC
  Filled 2020-12-05: qty 5

## 2020-12-05 MED ORDER — ALBUMIN HUMAN 5 % IV SOLN: INTRAVENOUS | Status: DC | PRN

## 2020-12-05 MED ORDER — SODIUM CHLORIDE 0.9 % IV SOLN: 250.0000 mL | INTRAVENOUS | Status: DC

## 2020-12-05 MED ORDER — SENNOSIDES-DOCUSATE SODIUM 8.6-50 MG PO TABS: 1.0000 | ORAL_TABLET | Freq: Every evening | ORAL | Status: DC | PRN

## 2020-12-05 MED ORDER — BUPIVACAINE LIPOSOME 1.3 % IJ SUSP
20.0000 mL | Freq: Once | INTRAMUSCULAR | Status: DC
  Filled 2020-12-05: qty 20

## 2020-12-05 MED ORDER — POVIDONE-IODINE 7.5 % EX SOLN: Freq: Once | CUTANEOUS | Status: DC

## 2020-12-05 MED ORDER — CHLORHEXIDINE GLUCONATE 0.12 % MT SOLN
OROMUCOSAL | Status: AC
  Administered 2020-12-05: 15 mL via OROMUCOSAL
  Filled 2020-12-05: qty 15

## 2020-12-05 MED ORDER — KETAMINE HCL 10 MG/ML IJ SOLN
INTRAMUSCULAR | Status: DC | PRN
  Administered 2020-12-05: 50 mg via INTRAVENOUS
  Administered 2020-12-05 (×3): 20 mg via INTRAVENOUS

## 2020-12-05 MED ORDER — INSULIN ASPART 100 UNIT/ML ~~LOC~~ SOLN: 0.0000 [IU] | Freq: Every day | SUBCUTANEOUS | Status: DC

## 2020-12-05 MED ORDER — CEFAZOLIN SODIUM-DEXTROSE 2-4 GM/100ML-% IV SOLN
2.0000 g | Freq: Three times a day (TID) | INTRAVENOUS | Status: AC
Start: 2020-12-05 — End: 2020-12-06
  Administered 2020-12-05 – 2020-12-06 (×2): 2 g via INTRAVENOUS
  Filled 2020-12-05 (×2): qty 100

## 2020-12-05 MED ORDER — THROMBIN 20000 UNITS EX SOLR
CUTANEOUS | Status: DC | PRN
  Administered 2020-12-05: 20000 [IU] via TOPICAL

## 2020-12-05 MED ORDER — SUFENTANIL CITRATE 250 MCG/5ML IV SOLN
0.2500 ug/kg/h | INTRAVENOUS | Status: AC
  Administered 2020-12-05: .5 ug/kg/h via INTRAVENOUS
  Filled 2020-12-05: qty 5

## 2020-12-05 MED ORDER — METHYLENE BLUE 0.5 % INJ SOLN
INTRAVENOUS | Status: AC
  Filled 2020-12-05: qty 10

## 2020-12-05 MED ORDER — PHENYLEPHRINE HCL-NACL 10-0.9 MG/250ML-% IV SOLN
INTRAVENOUS | Status: DC | PRN
  Administered 2020-12-05: 40 ug/min via INTRAVENOUS

## 2020-12-05 MED ORDER — ROCURONIUM BROMIDE 10 MG/ML (PF) SYRINGE
PREFILLED_SYRINGE | INTRAVENOUS | Status: AC
  Filled 2020-12-05: qty 10

## 2020-12-05 MED ORDER — METHOCARBAMOL 1000 MG/10ML IJ SOLN
500.0000 mg | Freq: Four times a day (QID) | INTRAVENOUS | Status: DC
  Filled 2020-12-05: qty 5

## 2020-12-05 MED ORDER — HYDROXYZINE HCL 50 MG/ML IM SOLN
50.0000 mg | Freq: Four times a day (QID) | INTRAMUSCULAR | Status: DC | PRN
  Administered 2020-12-05: 50 mg via INTRAMUSCULAR
  Filled 2020-12-05: qty 1

## 2020-12-05 MED ORDER — KETAMINE HCL 50 MG/5ML IJ SOSY
PREFILLED_SYRINGE | INTRAMUSCULAR | Status: AC
  Filled 2020-12-05: qty 10

## 2020-12-05 MED ORDER — BUPIVACAINE HCL (PF) 0.25 % IJ SOLN
INTRAMUSCULAR | Status: AC
  Filled 2020-12-05: qty 30

## 2020-12-05 MED ORDER — POTASSIUM CHLORIDE IN NACL 20-0.9 MEQ/L-% IV SOLN: INTRAVENOUS | Status: DC

## 2020-12-05 MED ORDER — SUCCINYLCHOLINE CHLORIDE 20 MG/ML IJ SOLN
INTRAMUSCULAR | Status: DC | PRN
  Administered 2020-12-05: 160 mg via INTRAVENOUS

## 2020-12-05 MED ORDER — LACTATED RINGERS IV SOLN: INTRAVENOUS | Status: DC | PRN

## 2020-12-05 MED ORDER — OXYCODONE HCL ER 10 MG PO T12A
10.0000 mg | EXTENDED_RELEASE_TABLET | Freq: Two times a day (BID) | ORAL | Status: DC
  Administered 2020-12-05 (×2): 10 mg via ORAL
  Filled 2020-12-05 (×2): qty 1

## 2020-12-05 MED ORDER — EPINEPHRINE PF 1 MG/ML IJ SOLN
INTRAMUSCULAR | Status: DC | PRN
  Administered 2020-12-05: .15 mL

## 2020-12-05 MED ORDER — LACTATED RINGERS IV SOLN: INTRAVENOUS | Status: DC

## 2020-12-05 MED ORDER — ACETAMINOPHEN 500 MG PO TABS
1000.0000 mg | ORAL_TABLET | Freq: Once | ORAL | Status: AC
  Administered 2020-12-05: 1000 mg via ORAL
  Filled 2020-12-05: qty 2

## 2020-12-05 MED ORDER — VITAMIN D 25 MCG (1000 UNIT) PO TABS: 5000.0000 [IU] | ORAL_TABLET | ORAL | Status: DC

## 2020-12-05 MED ORDER — LIDOCAINE 2% (20 MG/ML) 5 ML SYRINGE
INTRAMUSCULAR | Status: AC
  Filled 2020-12-05: qty 5

## 2020-12-05 MED ORDER — DOCUSATE SODIUM 100 MG PO CAPS
100.0000 mg | ORAL_CAPSULE | Freq: Two times a day (BID) | ORAL | Status: DC
  Administered 2020-12-05 (×2): 100 mg via ORAL
  Filled 2020-12-05 (×2): qty 1

## 2020-12-05 MED ORDER — FENTANYL CITRATE (PF) 100 MCG/2ML IJ SOLN
INTRAMUSCULAR | Status: DC | PRN
  Administered 2020-12-05: 100 ug via INTRAVENOUS
  Administered 2020-12-05: 50 ug via INTRAVENOUS

## 2020-12-05 MED ORDER — POLYETHYLENE GLYCOL 3350 17 G PO PACK: 17.0000 g | PACK | Freq: Every day | ORAL | Status: DC | PRN

## 2020-12-05 MED ORDER — LIDOCAINE 2% (20 MG/ML) 5 ML SYRINGE
INTRAMUSCULAR | Status: DC | PRN
  Administered 2020-12-05: 100 mg via INTRAVENOUS

## 2020-12-05 MED ORDER — PROPOFOL 1000 MG/100ML IV EMUL
INTRAVENOUS | Status: AC
  Filled 2020-12-05: qty 400

## 2020-12-05 MED ORDER — LABETALOL HCL 5 MG/ML IV SOLN: 5.0000 mg | Freq: Once | INTRAVENOUS | Status: AC

## 2020-12-05 MED ORDER — OXYCODONE HCL 5 MG PO TABS: 5.0000 mg | ORAL_TABLET | Freq: Once | ORAL | Status: DC | PRN

## 2020-12-05 MED ORDER — PROPOFOL 10 MG/ML IV BOLUS
INTRAVENOUS | Status: AC
  Filled 2020-12-05: qty 40

## 2020-12-05 MED ORDER — PROPOFOL 500 MG/50ML IV EMUL
INTRAVENOUS | Status: DC | PRN
  Administered 2020-12-05: 150 ug/kg/min via INTRAVENOUS
  Administered 2020-12-05: 100 ug/kg/min via INTRAVENOUS

## 2020-12-05 MED ORDER — EPINEPHRINE PF 1 MG/ML IJ SOLN
INTRAMUSCULAR | Status: AC
  Filled 2020-12-05: qty 1

## 2020-12-05 MED ORDER — OXYCODONE-ACETAMINOPHEN 7.5-325 MG PO TABS
1.0000 | ORAL_TABLET | ORAL | Status: DC | PRN
  Administered 2020-12-05 – 2020-12-06 (×4): 2 via ORAL
  Filled 2020-12-05 (×4): qty 2

## 2020-12-05 MED ORDER — PROPOFOL 10 MG/ML IV BOLUS
INTRAVENOUS | Status: DC | PRN
  Administered 2020-12-05: 200 mg via INTRAVENOUS

## 2020-12-05 MED ORDER — MORPHINE SULFATE (PF) 2 MG/ML IV SOLN
1.0000 mg | INTRAVENOUS | Status: DC | PRN
  Administered 2020-12-05: 2 mg via INTRAVENOUS
  Filled 2020-12-05: qty 1

## 2020-12-05 MED ORDER — ADULT MULTIVITAMIN W/MINERALS CH: 1.0000 | ORAL_TABLET | Freq: Every day | ORAL | Status: DC

## 2020-12-05 MED ORDER — ZOLPIDEM TARTRATE 5 MG PO TABS: 5.0000 mg | ORAL_TABLET | Freq: Every evening | ORAL | Status: DC | PRN

## 2020-12-05 MED ORDER — SUFENTANIL CITRATE 250 MCG/5ML IV SOLN
0.2500 ug/kg/h | INTRAVENOUS | Status: DC
  Filled 2020-12-05: qty 5

## 2020-12-05 MED ORDER — ONDANSETRON HCL 4 MG PO TABS: 4.0000 mg | ORAL_TABLET | Freq: Four times a day (QID) | ORAL | Status: DC | PRN

## 2020-12-05 MED ORDER — LABETALOL HCL 5 MG/ML IV SOLN
INTRAVENOUS | Status: AC
  Administered 2020-12-05: 5 mg via INTRAVENOUS
  Filled 2020-12-05: qty 4

## 2020-12-05 MED ORDER — CEFAZOLIN SODIUM-DEXTROSE 2-3 GM-%(50ML) IV SOLR
INTRAVENOUS | Status: DC | PRN
  Administered 2020-12-05 (×2): 3 g via INTRAVENOUS

## 2020-12-05 MED ORDER — FENTANYL CITRATE (PF) 100 MCG/2ML IJ SOLN
25.0000 ug | INTRAMUSCULAR | Status: DC | PRN
  Administered 2020-12-05: 50 ug via INTRAVENOUS

## 2020-12-05 MED ORDER — HYDROMORPHONE HCL 1 MG/ML IJ SOLN
0.5000 mg | INTRAMUSCULAR | Status: AC | PRN
  Administered 2020-12-05 (×3): 0.5 mg via INTRAVENOUS

## 2020-12-05 MED ORDER — MENTHOL 3 MG MT LOZG: 1.0000 | LOZENGE | OROMUCOSAL | Status: DC | PRN

## 2020-12-05 MED ORDER — INSULIN ASPART 100 UNIT/ML ~~LOC~~ SOLN: 0.0000 [IU] | Freq: Three times a day (TID) | SUBCUTANEOUS | Status: DC

## 2020-12-05 MED ORDER — DEXAMETHASONE SODIUM PHOSPHATE 10 MG/ML IJ SOLN
INTRAMUSCULAR | Status: DC | PRN
  Administered 2020-12-05: 10 mg via INTRAVENOUS

## 2020-12-05 MED ORDER — ONDANSETRON HCL 4 MG/2ML IJ SOLN
INTRAMUSCULAR | Status: DC | PRN
  Administered 2020-12-05: 4 mg via INTRAVENOUS

## 2020-12-05 SURGICAL SUPPLY — 99 items
BENZOIN TINCTURE PRP APPL 2/3 (GAUZE/BANDAGES/DRESSINGS) ×6 IMPLANT
BLADE CLIPPER SURG (BLADE) ×6 IMPLANT
BLADE SURG 10 STRL SS (BLADE) ×3 IMPLANT
BONE VIVIGEN FORMABLE 10CC (Bone Implant) ×3 IMPLANT
BUR PRESCISION 1.7 ELITE (BURR) IMPLANT
BUR ROUND FLUTED 5 RND (BURR) ×3 IMPLANT
BUR ROUND PRECISION 4.0 (BURR) IMPLANT
BUR SABER RD CUTTING 3.0 (BURR) IMPLANT
CAGE COROENT XL 12X18X60 (Cage) ×3 IMPLANT
CARTRIDGE OIL MAESTRO DRILL (MISCELLANEOUS) ×2 IMPLANT
CLIP NEUROVISION LG (CLIP) ×3 IMPLANT
CLSR STERI-STRIP ANTIMIC 1/2X4 (GAUZE/BANDAGES/DRESSINGS) ×6 IMPLANT
CNTNR URN SCR LID CUP LEK RST (MISCELLANEOUS) IMPLANT
CONT SPEC 4OZ STRL OR WHT (MISCELLANEOUS)
COVER BACK TABLE 80X110 HD (DRAPES) ×3 IMPLANT
COVER MAYO STAND STRL (DRAPES) ×6 IMPLANT
COVER SURGICAL LIGHT HANDLE (MISCELLANEOUS) ×6 IMPLANT
COVER WAND RF STERILE (DRAPES) ×3 IMPLANT
DIFFUSER DRILL AIR PNEUMATIC (MISCELLANEOUS) ×3 IMPLANT
DRAIN CHANNEL 15F RND FF W/TCR (WOUND CARE) IMPLANT
DRAPE C-ARM 42X72 X-RAY (DRAPES) ×6 IMPLANT
DRAPE C-ARMOR (DRAPES) ×3 IMPLANT
DRAPE POUCH INSTRU U-SHP 10X18 (DRAPES) ×6 IMPLANT
DRAPE SURG 17X23 STRL (DRAPES) ×24 IMPLANT
DURAPREP 26ML APPLICATOR (WOUND CARE) ×6 IMPLANT
ELECT BLADE 4.0 EZ CLEAN MEGAD (MISCELLANEOUS) ×3
ELECT BLADE 6.5 EXT (BLADE) ×3 IMPLANT
ELECT CAUTERY BLADE 6.4 (BLADE) ×6 IMPLANT
ELECT REM PT RETURN 9FT ADLT (ELECTROSURGICAL) ×6
ELECTRODE BLDE 4.0 EZ CLN MEGD (MISCELLANEOUS) ×2 IMPLANT
ELECTRODE REM PT RTRN 9FT ADLT (ELECTROSURGICAL) ×4 IMPLANT
EVACUATOR SILICONE 100CC (DRAIN) IMPLANT
FILTER STRAW FLUID ASPIR (MISCELLANEOUS) ×3 IMPLANT
GAUZE 4X4 16PLY RFD (DISPOSABLE) ×9 IMPLANT
GAUZE SPONGE 4X4 12PLY STRL (GAUZE/BANDAGES/DRESSINGS) ×6 IMPLANT
GAUZE SPONGE 4X4 16PLY XRAY LF (GAUZE/BANDAGES/DRESSINGS) ×6 IMPLANT
GLOVE BIO SURGEON STRL SZ7 (GLOVE) ×9 IMPLANT
GLOVE BIO SURGEON STRL SZ8 (GLOVE) ×6 IMPLANT
GLOVE SRG 8 PF TXTR STRL LF DI (GLOVE) ×4 IMPLANT
GLOVE SURG UNDER POLY LF SZ7 (GLOVE) ×6 IMPLANT
GLOVE SURG UNDER POLY LF SZ8 (GLOVE) ×2
GOWN STRL REUS W/ TWL LRG LVL3 (GOWN DISPOSABLE) ×6 IMPLANT
GOWN STRL REUS W/ TWL XL LVL3 (GOWN DISPOSABLE) ×6 IMPLANT
GOWN STRL REUS W/TWL LRG LVL3 (GOWN DISPOSABLE) ×3
GOWN STRL REUS W/TWL XL LVL3 (GOWN DISPOSABLE) ×3
GUIDEWIRE SHARP VIPER II (WIRE) ×12 IMPLANT
IV CATH 14GX2 1/4 (CATHETERS) ×6 IMPLANT
KIT BASIN OR (CUSTOM PROCEDURE TRAY) ×6 IMPLANT
KIT DILATOR XLIF 5 (KITS) ×2 IMPLANT
KIT POSITION SURG JACKSON T1 (MISCELLANEOUS) ×3 IMPLANT
KIT SURGICAL ACCESS MAXCESS 4 (KITS) ×3 IMPLANT
KIT TURNOVER KIT B (KITS) ×6 IMPLANT
KIT XLIF (KITS) ×1
MARKER SKIN DUAL TIP RULER LAB (MISCELLANEOUS) ×9 IMPLANT
MODULE EMG NEEDLE SSEP NVM5 (NEEDLE) ×3 IMPLANT
MODULE NVM5 NEXT GEN EMG (NEEDLE) ×3 IMPLANT
NEEDLE 18GX1X1/2 (RX/OR ONLY) (NEEDLE) ×3 IMPLANT
NEEDLE 22X1 1/2 (OR ONLY) (NEEDLE) ×6 IMPLANT
NEEDLE HYPO 25GX1X1/2 BEV (NEEDLE) ×6 IMPLANT
NEEDLE SPNL 18GX3.5 QUINCKE PK (NEEDLE) ×9 IMPLANT
NS IRRIG 1000ML POUR BTL (IV SOLUTION) ×9 IMPLANT
OIL CARTRIDGE MAESTRO DRILL (MISCELLANEOUS) ×3
PACK LAMINECTOMY ORTHO (CUSTOM PROCEDURE TRAY) ×6 IMPLANT
PACK UNIVERSAL I (CUSTOM PROCEDURE TRAY) ×6 IMPLANT
PAD ARMBOARD 7.5X6 YLW CONV (MISCELLANEOUS) ×12 IMPLANT
PATTIES SURGICAL .5 X1 (DISPOSABLE) ×3 IMPLANT
PATTIES SURGICAL .5X1.5 (GAUZE/BANDAGES/DRESSINGS) ×3 IMPLANT
PROBE BALL TIP NVM5 SNG USE (BALLOONS) ×3 IMPLANT
ROD PRE LORDOSED VIPER 5.5X50 (Rod) ×3 IMPLANT
ROD VIPER II LORDOSED 5.5X40 (Rod) ×3 IMPLANT
SCREW SET SINGLE INNER MIS (Screw) ×12 IMPLANT
SCREW XTAB POLY VIPER  6X45 (Screw) ×1 IMPLANT
SCREW XTAB POLY VIPER  7X45 (Screw) ×3 IMPLANT
SCREW XTAB POLY VIPER 6X45 (Screw) ×2 IMPLANT
SCREW XTAB POLY VIPER 7X45 (Screw) ×6 IMPLANT
SPONGE INTESTINAL PEANUT (DISPOSABLE) ×12 IMPLANT
SPONGE LAP 4X18 RFD (DISPOSABLE) ×9 IMPLANT
SPONGE SURGIFOAM ABS GEL 100 (HEMOSTASIS) ×3 IMPLANT
STAPLER VISISTAT 35W (STAPLE) ×3 IMPLANT
STRIP CLOSURE SKIN 1/2X4 (GAUZE/BANDAGES/DRESSINGS) ×6 IMPLANT
SURGIFLO W/THROMBIN 8M KIT (HEMOSTASIS) IMPLANT
SUT MNCRL AB 4-0 PS2 18 (SUTURE) ×6 IMPLANT
SUT VIC AB 0 CT1 18XCR BRD 8 (SUTURE) ×4 IMPLANT
SUT VIC AB 0 CT1 8-18 (SUTURE) ×2
SUT VIC AB 1 CT1 18XCR BRD 8 (SUTURE) ×6 IMPLANT
SUT VIC AB 1 CT1 8-18 (SUTURE) ×3
SUT VIC AB 2-0 CT2 18 VCP726D (SUTURE) ×6 IMPLANT
SYR 20ML LL LF (SYRINGE) ×6 IMPLANT
SYR BULB IRRIG 60ML STRL (SYRINGE) ×6 IMPLANT
SYR CONTROL 10ML LL (SYRINGE) ×9 IMPLANT
SYR TB 1ML LUER SLIP (SYRINGE) ×3 IMPLANT
TAP CANN VIPER2 DL 6.0 (TAP) ×6 IMPLANT
TAP CANN VIPER2 DL 7.0 (TAP) ×6 IMPLANT
TAPE CLOTH SURG 6X10 WHT LF (GAUZE/BANDAGES/DRESSINGS) ×6 IMPLANT
TOWEL GREEN STERILE (TOWEL DISPOSABLE) ×3 IMPLANT
TOWEL GREEN STERILE FF (TOWEL DISPOSABLE) ×3 IMPLANT
TRAY FOLEY MTR SLVR 16FR STAT (SET/KITS/TRAYS/PACK) ×3 IMPLANT
WATER STERILE IRR 1000ML POUR (IV SOLUTION) ×3 IMPLANT
YANKAUER SUCT BULB TIP NO VENT (SUCTIONS) ×6 IMPLANT

## 2020-12-05 NOTE — Transfer of Care (Signed)
Immediate Anesthesia Transfer of Care Note  Patient: Luke Larsen  Procedure(s) Performed: LEFT LATERAL INTERBODY FUSION LUMBAR 2 - LUMBAR 3 WITH INSTRUMENTATION AND ALLOGRAFT (Left ) POSTERIOR SPINAL FUSION LUMBAR 2- LUMBAR 3 WITH INSTRUMENTATION AND ALLOGRAFT (N/A )  Patient Location: PACU  Anesthesia Type:General  Level of Consciousness: awake and drowsy  Airway & Oxygen Therapy: Patient Spontanous Breathing and Patient connected to face mask oxygen  Post-op Assessment: Report given to RN, Post -op Vital signs reviewed and stable and Patient moving all extremities X 4  Post vital signs: Reviewed and stable  Last Vitals:  Vitals Value Taken Time  BP 156/90 12/05/20 1237  Temp    Pulse 70 12/05/20 1247  Resp 24 12/05/20 1247  SpO2 99 % 12/05/20 1247  Vitals shown include unvalidated device data.  Last Pain:  Vitals:   12/05/20 0651  PainSc: 3          Complications: No complications documented.

## 2020-12-05 NOTE — H&P (Signed)
PREOPERATIVE H&P  Chief Complaint: Right leg pain  HPI: Luke Larsen is a 65 y.o. male who presents with ongoing pain in the right leg  MRI reveals severe NF stenosis and segmental collapse on the right at L2/3  Patient has failed multiple forms of conservative care and continues to have pain (see office notes for additional details regarding the patient's full course of treatment)  Past Medical History:  Diagnosis Date  . Arthritis   . Back pain   . Constipation   . Diabetes mellitus without complication ([place])   . Dyspnea   . Gout   . Hip pain   . History of kidney stones   . Hypercholesterolemia   . Hypertension   . Joint pain   . Lower extremity edema   . Neck pain   . Shoulder pain   . Sleep apnea   . Vitamin D deficiency    Past Surgical History:  Procedure Laterality Date  . BACK SURGERY     04/2019  . KIDNEY STONE SURGERY  03/28/2012   Social History   Socioeconomic History  . Marital status: Married    Spouse name: Burke Terry  . Number of children: 2  . Years of education: B.A.S  . Highest education level: Not on file  Occupational History  . Occupation: Tree surgeon  Tobacco Use  . Smoking status: Never Smoker  . Smokeless tobacco: Never Used  Vaping Use  . Vaping Use: Never used  Substance and Sexual Activity  . Alcohol use: No    Comment: Quit drinking alcohol 59yr ago;very little  . Drug use: No  . Sexual activity: Not on file  Other Topics Concern  . Not on file  Social History Narrative   Pt lives at home with his spouse and daughter.   Caffeine Use- Quit 01/04/13   Social Determinants of Health   Financial Resource Strain: Not on file  Food Insecurity: Not on file  Transportation Needs: Not on file  Physical Activity: Not on file  Stress: Not on file  Social Connections: Not on file   Family History  Problem Relation Age of Onset  . Cancer Mother   . Diabetes Father   . Hypertension Father   . Hyperlipidemia Father    . Obesity Father    Allergies  Allergen Reactions  . Pravastatin Sodium     myalgias  . Qsymia [Phentermine-Topiramate]     tired, paresthesias  . Cymbalta [Duloxetine Hcl] Other (See Comments)    "It made me feel really weird, awful."   Prior to Admission medications   Medication Sig Start Date End Date Taking? Authorizing Provider  Cholecalciferol (DIALYVITE VITAMIN D 5000) 125 MCG (5000 UT) capsule Take 5,000 Units by mouth 3 (three) times a week.   Yes [provider]  diclofenac Sodium (VOLTAREN) 1 % GEL Apply 1 application topically 4 (four) times daily as needed (pain). 04/12/20  Yes [provider]  Eszopiclone 3 MG TABS Take 3 mg by mouth at bedtime. 11/24/20  Yes [provider]  Ibuprofen-Acetaminophen (ADVIL DUAL ACTION) 125-250 MG TABS Take 1-2 tablets by mouth every 4 (four) hours as needed (pain).   Yes [provider]  Multiple Vitamin (MULTIVITAMIN WITH MINERALS) TABS tablet Take 1 tablet by mouth daily.   Yes [provider]  Oxycodone HCl 10 MG TABS Take 10 mg by mouth every 4 (four) hours as needed for pain. 11/24/20  Yes [provider]  polyethylene glycol (MIRALAX /  GLYCOLAX) 17 g packet Take 17 g by mouth daily as needed for moderate constipation.   Yes [provider]  tiZANidine (ZANAFLEX) 4 MG tablet Take 2 mg by mouth 3 (three) times daily as needed for muscle spasms.   Yes [provider]  valsartan (DIOVAN) 320 MG tablet Take 320 mg by mouth 2 (two) times a week. 09/11/18  Yes [provider]  ACCU-CHEK GUIDE test strip USE AS DIRECTED UP TO 4 TIMES A DAY 01/01/20   Abby Potash, PA-C  blood glucose meter kit and supplies KIT Dispense based on patient and insurance preference. Use up to four times daily as directed. (FOR ICD-9 250.00, 250.01). 12/12/19   Dennard Nip D, MD  Vitamin D, Ergocalciferol, (DRISDOL) 1.25 MG (50000 UNIT) CAPS capsule Take 1 capsule (50,000 Units total) by  mouth every 7 (seven) days. Patient not taking: No sig reported 08/07/20   Abby Potash, PA-C     All other systems have been reviewed and were otherwise negative with the exception of those mentioned in the HPI and as above.  Physical Exam: Vitals:   12/05/20 0622  BP: (!) 156/65  Pulse: 75  Resp: 18  Temp: 98.2 F (36.8 C)  SpO2: 95%    Body mass index is 45.32 kg/m.  General: Alert, no acute distress Cardiovascular: No pedal edema Respiratory: No cyanosis, no use of accessory musculature Skin: No lesions in the area of chief complaint Neurologic: Sensation intact distally Psychiatric: Patient is competent for consent with normal mood and affect Lymphatic: No axillary or cervical lymphadenopathy   Assessment/Plan: RIGHT LUMBAR 2 RADICULOPATHY Plan for Procedure(s): LEFT LATERAL INTERBODY FUSION LUMBAR 2 - LUMBAR 3 WITH INSTRUMENTATION AND ALLOGRAFT POSTERIOR SPINAL FUSION LUMBAR 2- LUMBAR 3 WITH INSTRUMENTATION AND ALLOGRAFT   Norva Karvonen, MD 12/05/2020 7:18 AM

## 2020-12-05 NOTE — Progress Notes (Signed)
Dr. Daiva Huge notified of patients improved status of pain control and blood pressure prior to transfer, MD okay for patient to transfer to floor. Patient transferred to unit via wheelchair by tech.

## 2020-12-05 NOTE — Op Note (Signed)
PATIENT NAME: Luke Larsen   MEDICAL RECORD NO.:   [id number]    DATE OF BIRTH: 1956/01/25   DATE OF PROCEDURE: 12/05/2020                               OPERATIVE REPORT   PREOPERATIVE DIAGNOSES: 1.  Right-sided L2 radiculopathy. 2.  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  Segmental collapse on the right at L2/3   POSTOPERATIVE DIAGNOSES: 1.  Right-sided L2 radiculopathy. 2.  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  Segmental collapse on the right at L2/3   PROCEDURE:  1.  Left-sided lateral interbody fusion, L2/3  via direct lateral retroperitoneal approach. 2.  Insertion of interbody device x1 (12 x 18 mm x 60 mm NuVasive intervertebral spacer). 3.  Use of morselized allograft -- ViviGen.   4.  Intraoperative use of fluoroscopy. 5.  Posterior spinal fusion, L2/3 6.  Placement of posterior instrumentation, L2, L3   SURGEON:  Phylliss Bob, MD   ASSISTANT:  Pricilla Holm PA-C.   ANESTHESIA:  General endotracheal anesthesia.   COMPLICATIONS:  None.   DISPOSITION:  Stable.   ESTIMATED BLOOD LOSS:  Minimal.   INDICATIONS:  Briefly, the patient is a very pleasant 65 year old male who did present to me with ongoing pain in the right leg.  His examination and history was very consistent with right L2 radiculopathy.  His x-rays and MRI did reveal a retrolisthesis at L2-3, in addition to segmental collapse and compression of the neuroforamen on the right at L2-3, resulting in compression of the right L2 nerve. Given his ongoing pain and dysfunction, we did discuss proceeding with the procedure reflected above.  The patient did wish to proceed, after a full understanding of the risks and benefits of surgery.   DESCRIPTION OF PROCEDURE:  On 12/05/2020 the patient was brought to surgery and general endotracheal anesthesia was administered.  The patient was placed in the lateral decubitus position, with the left side up. Neurologic monitoring leads were placed by the monitoring technician.  The patient's torso and lower extremities  were secured to the bed.  The patient's hips and knees were flexed in order to lessen the tension on the psoas musculature.  The left flank was then prepped and draped in the usual sterile fashion.  The bed was flexed, in order to optimize exposure to the L2/3 intervertebral space.  After a timeout procedure was performed, a left-sided transverse incision was made over the left flank overlying the L2/3 intervertebral space.  The retroperitoneal space was encountered, after dissection through the oblique musculature.  The peritoneum was bluntly swept anteriorly, and the psoas was readily identified.  I did use a series of dilators to dock over the L2/3 intervertebral space.  I did use neurologic monitoring while placing the dilators, in order to ensure that there were no neurologic structures in the immediate vicinity of the dilators.  The lumbar plexus was noted to be posterior.  A self-retaining retractor was placed, and was attached to a rigid arm.  The retractor was very gently dilated and a shim was placed into the L2/3 intervertebral space.  I then used a knife to perform an annulotomy at the lateral aspect of the L2/3 intervertebral space.  I then used a series of curettes and pituitary rongeurs in order to perform a thorough and complete L2/3 intervertebral diskectomy.  The contralateral annulus was released.  I then placed a series of intervertebral spacer trials, and I did feel  that a 12 x 18 mm x 60 mm spacer would be the most appropriate fit.  The appropriate spacer was then packed with ViviGen and tamped into position.  I was very pleased with the final resting position of the intervertebral spacer.  Excellent height restoration was noted on the right side, the side of the preoperative collapse. I was very pleased with the final AP and lateral fluoroscopic images and the excellent restoration of disk height identified on both AP and lateral images.  At this point, the wound was copiously irrigated.  The  fascia, internal, and external oblique musculature was closed using #1 Vicryl.  The subcutaneous layer was  closed using 2-0 Vicryl and the skin was closed using 4-0 Monocryl.   Sterile dressing was then applied over the left flank incision.  The patient was then placed prone on a well-padded flat Jackson bed with a spinal frame.  The back was prepped and draped in the usual sterile fashion.  At this point, bilateral paramedian incisions were made overlying the L2 and L3 pedicles.  The left-sided L2-3 facet joint was identified and decorticated using a high-speed bur, at which point the remainder of the vision was packed into the region of the left L2-3 facet joint, to help aid in the success of the fusion.  At this point, Jamshidi's were advanced across the L2 and L3 pedicles bilaterally.  Guidewires were placed, and I did use a 6 mm tap over the guidewires, and through the L2 and L3 pedicles.  Triggered EMG was used to test the taps to ensure that they were not in the vicinity of any neurologic structures.  At this point, pedicle screws were placed bilaterally, 7 mm in diameter. 50 mm rods was secured into the tulip heads of the screws.  Caps were placed in a final locking procedure was performed.  The wound was copiously irrigated.  I was very pleased with the AP and lateral fluoroscopic images.  The wound was then closed using #1 Vicryl followed by 2-0 Vicryl followed by 4-0 Monocryl.  Benzoin and Steri-Strips were applied over the left lateral wound and left posterior wound, followed by sterile dressing.     Of note, I did use neurologic monitoring throughout the entire surgery, and there was no sustained EMG activity noted throughout the entire surgery. All instrument counts were correct at the termination of the procedure.   Of note, Pricilla Holm was my assistant throughout surgery, and did aid in retraction, placement of the hardware, suctioning, and closure.  Phylliss Bob, MD

## 2020-12-05 NOTE — Anesthesia Postprocedure Evaluation (Signed)
Anesthesia Post Note  Patient: Luke Larsen  Procedure(s) Performed: LEFT LATERAL INTERBODY FUSION LUMBAR 2 - LUMBAR 3 WITH INSTRUMENTATION AND ALLOGRAFT (Left ) POSTERIOR SPINAL FUSION LUMBAR 2- LUMBAR 3 WITH INSTRUMENTATION AND ALLOGRAFT (N/A )     Patient location during evaluation: PACU Anesthesia Type: General Level of consciousness: awake and alert and oriented Pain management: pain level controlled Vital Signs Assessment: post-procedure vital signs reviewed and stable Respiratory status: spontaneous breathing, nonlabored ventilation and respiratory function stable Cardiovascular status: blood pressure returned to baseline Postop Assessment: no apparent nausea or vomiting Anesthetic complications: no   No complications documented.  Last Vitals:  Vitals:   12/05/20 1430 12/05/20 1440  BP: (!) 176/82 (!) 153/86  Pulse: 76 78  Resp: 16 14  Temp:    SpO2: 97% 97%    Last Pain:  Vitals:   12/05/20 1440  PainSc: [place] Aislynn Cifelli

## 2020-12-05 NOTE — Anesthesia Procedure Notes (Signed)
Procedure Name: Intubation Date/Time: 12/05/2020 7:52 AM Performed by: Hoy Morn, CRNA Pre-anesthesia Checklist: Patient identified, Emergency Drugs available, Suction available and Patient being monitored Patient Re-evaluated:Patient Re-evaluated prior to induction Oxygen Delivery Method: Circle system utilized Preoxygenation: Pre-oxygenation with 100% oxygen Induction Type: IV induction Ventilation: Mask ventilation without difficulty Laryngoscope Size: Miller and 2 Grade View: Grade I Tube type: Oral Tube size: 7.5 mm Number of attempts: 1 Airway Equipment and Method: Stylet and Oral airway Placement Confirmation: ETT inserted through vocal cords under direct vision,  positive ETCO2 and breath sounds checked- equal and bilateral Secured at: 23 cm Tube secured with: Tape Dental Injury: Teeth and Oropharynx as per pre-operative assessment

## 2020-12-05 NOTE — Anesthesia Procedure Notes (Signed)
Arterial Line Insertion Start/End2/17/2022 7:54 AM, 12/05/2020 7:55 AM Performed by: Brennan Bailey, MD, anesthesiologist  Patient location: OR. Preanesthetic checklist: patient identified, IV checked, site marked, risks and benefits discussed, surgical consent, monitors and equipment checked, pre-op evaluation, timeout performed and anesthesia consent Patient sedated Left, radial was placed Catheter size: 20 Fr Hand hygiene performed  and maximum sterile barriers used   Attempts: 1 Procedure performed without using ultrasound guided technique. Following insertion, dressing applied and Biopatch. Post procedure assessment: normal and unchanged  Patient tolerated the procedure well with no immediate complications.

## 2020-12-06 LAB — HEMOGLOBIN A1C
Hgb A1c MFr Bld: 5.4 % (ref 4.8–5.6)
Mean Plasma Glucose: 108.28 mg/dL

## 2020-12-06 LAB — GLUCOSE, CAPILLARY: Glucose-Capillary: 117 mg/dL — ABNORMAL HIGH (ref 70–99)

## 2020-12-06 MED ORDER — METHOCARBAMOL 500 MG PO TABS: 500.0000 mg | ORAL_TABLET | Freq: Four times a day (QID) | ORAL | 0 refills | Status: DC

## 2020-12-06 MED ORDER — OXYCODONE HCL ER 10 MG PO T12A: 10.0000 mg | EXTENDED_RELEASE_TABLET | Freq: Two times a day (BID) | ORAL | 0 refills | Status: AC

## 2020-12-06 MED ORDER — OXYCODONE-ACETAMINOPHEN 7.5-325 MG PO TABS: 1.0000 | ORAL_TABLET | ORAL | 0 refills | Status: AC | PRN

## 2020-12-06 NOTE — Evaluation (Signed)
Physical Therapy Evaluation and Discharge Patient Details Name: Luke Larsen MRN: [id number] DOB: 1956-01-10 Today's Date: 12/06/2020   History of Present Illness  65 y.o. male who presented with ongoing pain in the right leg.  PO day 1 from L LAT/POST fusion L2/3.  Clinical Impression  Patient evaluated by Physical Therapy with no further acute PT needs identified. All education has been completed and the patient has no further questions. Pt was able to demonstrate transfers and ambulation with gross modified independence and no AD. Pt was educated on precautions, brace application/wearing schedule, appropriate activity progression, and car transfer. See below for any follow-up Physical Therapy or equipment needs. PT is signing off. Thank you for this referral.     Follow Up Recommendations No PT follow up;Supervision - Intermittent    Equipment Recommendations  None recommended by PT    Recommendations for Other Services       Precautions / Restrictions Precautions Precautions: Back Precaution Booklet Issued: Yes (comment) Precaution Comments: Reviewed precautions verbally during functional mobility. Required Braces or Orthoses: Spinal Brace Spinal Brace: Thoracolumbosacral orthotic;Applied in standing position Restrictions Weight Bearing Restrictions: No      Mobility  Bed Mobility Overal bed mobility: Modified Independent             General bed mobility comments: HOB slightly elevated and rails lowered to simulate home environment.    Transfers Overall transfer level: Modified independent Equipment used: None             General transfer comment: No assist required to power-up to full stand.  Ambulation/Gait Ambulation/Gait assistance: Modified independent (Device/Increase time) Gait Distance (Feet): 250 Feet Assistive device: None Gait Pattern/deviations: Step-through pattern;Decreased stride length;Trunk flexed Gait velocity: Decreased Gait velocity  interpretation: <1.31 ft/sec, indicative of household ambulator General Gait Details: L lateral lean from trunk. Pt reports this is baseline but is able to make some corrective changes.  Stairs Stairs: Yes Stairs assistance: Min guard Stair Management: One rail Right;Step to pattern;Forwards Number of Stairs: 10 General stair comments: VC's for sequencing and general safety. No assist required however close guard provided for safety.  Wheelchair Mobility    Modified Rankin (Stroke Patients Only)       Balance Overall balance assessment: Mild deficits observed, not formally tested                                           Pertinent Vitals/Pain Pain Assessment: Faces Faces Pain Scale: Hurts even more Pain Location: Low back and R side. Pain Descriptors / Indicators: Grimacing;Guarding;Constant;Operative site guarding Pain Intervention(s): Limited activity within patient's tolerance;Monitored during session;Repositioned    Home Living Family/patient expects to be discharged to:: Private residence Living Arrangements: Spouse/significant other Available Help at Discharge: Family;Available 24 hours/day Type of Home: House Home Access: Stairs to enter Entrance Stairs-Rails: Right Entrance Stairs-Number of Steps: 3 at the home.  15 stairs to the house up a hill. Home Layout: One level Home Equipment: Hand held shower head      Prior Function Level of Independence: Independent               Hand Dominance   Dominant Hand: Right    Extremity/Trunk Assessment   Upper Extremity Assessment Upper Extremity Assessment: Overall WFL for tasks assessed    Lower Extremity Assessment Lower Extremity Assessment: Generalized weakness    Cervical / Trunk Assessment Cervical / Trunk  Assessment: Other exceptions Cervical / Trunk Exceptions: s/p surgery. Noted pt with significant L lateral lean in standing  Communication   Communication: No difficulties   Cognition Arousal/Alertness: Awake/alert Behavior During Therapy: WFL for tasks assessed/performed Overall Cognitive Status: Within Functional Limits for tasks assessed                                        General Comments      Exercises     Assessment/Plan    PT Assessment Patent does not need any further PT services  PT Problem List         PT Treatment Interventions      PT Goals (Current goals can be found in the Care Plan section)  Acute Rehab PT Goals Patient Stated Goal: To return home and move good PT Goal Formulation: All assessment and education complete, DC therapy    Frequency     Barriers to discharge        Co-evaluation               AM-PAC PT "6 Clicks" Mobility  Outcome Measure Help needed turning from your back to your side while in a flat bed without using bedrails?: None Help needed moving from lying on your back to sitting on the side of a flat bed without using bedrails?: None Help needed moving to and from a bed to a chair (including a wheelchair)?: None Help needed standing up from a chair using your arms (e.g., wheelchair or bedside chair)?: None Help needed to walk in hospital room?: None Help needed climbing 3-5 steps with a railing? : None 6 Click Score: 24    End of Session Equipment Utilized During Treatment: Gait belt;Back brace Activity Tolerance: Patient tolerated treatment well Patient left: with call bell/phone within reach;with family/visitor present Nurse Communication: Mobility status PT Visit Diagnosis: Pain;Unsteadiness on feet (R26.81) Pain - part of body:  (back)    Time: 9622-2979 PT Time Calculation (min) (ACUTE ONLY): 28 min   Charges:   PT Evaluation $PT Eval Low Complexity: 1 Low PT Treatments $Gait Training: 8-22 mins        Rolinda Roan, PT, DPT Acute Rehabilitation Services Pager: [phone] Office: [phone]   Thelma Comp 12/06/2020, 10:47 AM

## 2020-12-06 NOTE — Evaluation (Signed)
Occupational Therapy Evaluation Patient Details Name: Luke Larsen MRN: [id number] DOB: Jul 11, 1956 Today's Date: 12/06/2020    History of Present Illness 65 y.o. male who presented with ongoing pain in the right leg.  PO day 1 from L LAT/POST fusion L2/3.   Clinical Impression   Patient admitted with the above diagnosis and procedure.  Expected discomfort to low back and R flank are his current deficits.  He is moving pretty well despite discomfort and a difficult nights sleep.  He was able to dress himself with hip kit from a sit/stand level.  Patient endorses walking the halls.  PT eval pending.  OT recommended hip kit and shower chair.  Patient verbalized understanding of all precautions, and all questions answered.  He plans on a discharge home today.  No follow up OT needs.      Follow Up Recommendations  No OT follow up    Equipment Recommendations  Other (comment) (hip kit)    Recommendations for Other Services       Precautions / Restrictions Precautions Precautions: Back Precaution Booklet Issued: Yes (comment) Required Braces or Orthoses: Spinal Brace Spinal Brace: Thoracolumbosacral orthotic;Applied in standing position Restrictions Weight Bearing Restrictions: No      Mobility Bed Mobility Overal bed mobility: Modified Independent                  Transfers Overall transfer level: Modified independent               General transfer comment: painful with movement.    Balance Overall balance assessment: Mild deficits observed, not formally tested                                         ADL either performed or assessed with clinical judgement   ADL Overall ADL's : Modified independent                                       General ADL Comments: patient did well with hip kit despite discomfort.  Spouse will be there 24/7 until Tuesday.     Vision Baseline Vision/History: Wears glasses Patient Visual Report:  No change from baseline       Perception     Praxis      Pertinent Vitals/Pain Pain Assessment: Faces Faces Pain Scale: Hurts whole lot Pain Location: Low back and R side. Pain Descriptors / Indicators: Grimacing;Guarding;Constant Pain Intervention(s): Monitored during session     Hand Dominance Right   Extremity/Trunk Assessment Upper Extremity Assessment Upper Extremity Assessment: Overall WFL for tasks assessed   Lower Extremity Assessment Lower Extremity Assessment: Defer to PT evaluation   Cervical / Trunk Assessment Cervical / Trunk Assessment: Normal   Communication Communication Communication: No difficulties   Cognition Arousal/Alertness: Awake/alert Behavior During Therapy: WFL for tasks assessed/performed Overall Cognitive Status: Within Functional Limits for tasks assessed                                                      Home Living Family/patient expects to be discharged to:: Private residence Living Arrangements: Spouse/significant other Available Help at Discharge: Family;Available 24 hours/day Type of Home: House  Home Access: Stairs to enter Entrance Stairs-Number of Steps: 3 at the home.  15 stairs to the house up a hill. Entrance Stairs-Rails: Right Home Layout: One level     Bathroom Shower/Tub: Occupational psychologist: Standard     Home Equipment: Hand held shower head          Prior Functioning/Environment Level of Independence: Independent                 OT Problem List: Pain      OT Treatment/Interventions:      OT Goals(Current goals can be found in the care plan section) Acute Rehab OT Goals Patient Stated Goal: To return home and move good OT Goal Formulation: With patient Time For Goal Achievement: 12/06/20 Potential to Achieve Goals: Good  OT Frequency:     Barriers to D/C:  none noted          Co-evaluation              AM-PAC OT "6 Clicks" Daily Activity      Outcome Measure Help from another person eating meals?: None Help from another person taking care of personal grooming?: None Help from another person toileting, which includes using toliet, bedpan, or urinal?: None Help from another person bathing (including washing, rinsing, drying)?: A Little Help from another person to put on and taking off regular upper body clothing?: None Help from another person to put on and taking off regular lower body clothing?: A Little 6 Click Score: 22   End of Session Equipment Utilized During Treatment: Back brace Nurse Communication: Patient requests pain meds  Activity Tolerance: Patient tolerated treatment well Patient left: in chair;with call bell/phone within reach;with family/visitor present  OT Visit Diagnosis: Pain Pain - Right/Left: Right                Time: 0263-7858 OT Time Calculation (min): 26 min Charges:  OT General Charges $OT Visit: 1 Visit OT Evaluation $OT Eval Moderate Complexity: 1 Mod OT Treatments $Self Care/Home Management : 8-22 mins  12/06/2020  Rich, OTR/L  Acute Rehabilitation Services  Office:  [phone]   [name] 12/06/2020, 8:46 AM

## 2020-12-06 NOTE — Progress Notes (Signed)
Patient is discharged from room 3C02 at this time. Alert and in stable condition. IV site d/c'd and instructions read to patient and spouse with understanding verbalized and all questions answered. Left unit via wheelchair with all belongings at side.  ?

## 2020-12-06 NOTE — Progress Notes (Signed)
    Patient doing well PO day 1 from L LAT/POST fusion, resolved leg pain, moderate expected PO LBP, well controlled. Pt reports R side hurts, denies abdominal pain, states it is about R ribs and side. Eating and drinking normally, +passing gas. Has been up and walking.   Physical Exam: Vitals:   12/06/20 0327 12/06/20 0710  BP: 131/66 (!) 132/59  Pulse: 83 74  Resp: 20 18  Temp: 98.1 F (36.7 C) 98.2 F (36.8 C)  SpO2: 97% 96%    Dressing in place CDI, L side and back, pt resting comfortably on side. NL mood and affect, brace at bedside  NVI  POD #1 s/p LAT/POST fusion with resolved leg pain and moderate well controlled LBP  - up with PT/OT, encourage ambulation - Percocet for pain, Robaxin for muscle spasms x 7 days  - Oxycontin x 7 days - After 7 days px meds through px mngmt, pt aware  - likely d/c home today with f/u in 2 weeks

## 2020-12-09 ENCOUNTER — Encounter (HOSPITAL_COMMUNITY): Payer: Self-pay | Admitting: Orthopedic Surgery

## 2020-12-09 NOTE — Discharge Summary (Signed)
Patient ID: Luke Larsen MRN: [id number] DOB/AGE: 05/17/1956 65 y.o.  Admit date: 12/05/2020 Discharge date: 12/06/2020  Admission Diagnoses:  Active Problems:   Radiculopathy   Discharge Diagnoses:  Same  Past Medical History:  Diagnosis Date  . Arthritis   . Back pain   . Constipation   . Diabetes mellitus without complication ([place])   . Dyspnea   . Gout   . Hip pain   . History of kidney stones   . Hypercholesterolemia   . Hypertension   . Joint pain   . Lower extremity edema   . Neck pain   . Shoulder pain   . Sleep apnea   . Vitamin D deficiency     Surgeries: Procedure(s): LEFT LATERAL INTERBODY FUSION LUMBAR 2 - LUMBAR 3 WITH INSTRUMENTATION AND ALLOGRAFT POSTERIOR SPINAL FUSION LUMBAR 2- LUMBAR 3 WITH INSTRUMENTATION AND ALLOGRAFT on 12/05/2020   Consultants: None  Discharged Condition: Improved  Hospital Course: Luke Larsen is an 65 y.o. male who was admitted 12/05/2020 for operative treatment of radiculopathy. Patient has severe unremitting pain that affects sleep, daily activities, and work/hobbies. After pre-op clearance the patient was taken to the operating room on 12/05/2020 and underwent  Procedure(s): LEFT LATERAL INTERBODY FUSION LUMBAR 2 - LUMBAR 3 WITH INSTRUMENTATION AND ALLOGRAFT POSTERIOR SPINAL FUSION LUMBAR 2- LUMBAR 3 WITH INSTRUMENTATION AND ALLOGRAFT.    Patient was given perioperative antibiotics:  Anti-infectives (From admission, onward)   Start     Dose/Rate Route Frequency Ordered Stop   12/05/20 1900  ceFAZolin (ANCEF) IVPB 2g/100 mL premix        2 g 200 mL/hr over 30 Minutes Intravenous Every 8 hours 12/05/20 1450 12/06/20 0846   12/05/20 0600  ceFAZolin (ANCEF) 3 g in dextrose 5 % 50 mL IVPB  Status:  Discontinued        3 g 100 mL/hr over 30 Minutes Intravenous On call to O.R. 12/04/20 1312 12/05/20 1450       Patient was given sequential compression devices, early ambulation to prevent DVT.  Patient benefited maximally  from hospital stay and there were no complications.    Recent vital signs: BP (!) 132/59 (BP Location: Right Arm)   Pulse 74   Temp 98.2 F (36.8 C) (Oral)   Resp 18   Ht '6\' 2"'  (1.88 m)   Wt (!) 160.1 kg   SpO2 96%   BMI 45.32 kg/m    Discharge Medications:   Allergies as of 12/06/2020      Reactions   Pravastatin Sodium    myalgias   Qsymia [phentermine-topiramate]    tired, paresthesias   Cymbalta [duloxetine Hcl] Other (See Comments)   "It made me feel really weird, awful."      Medication List    STOP taking these medications   Oxycodone HCl 10 MG Tabs Replaced by: oxyCODONE 10 mg 12 hr tablet   tiZANidine 4 MG tablet Commonly known as: ZANAFLEX     TAKE these medications   Accu-Chek Guide test strip Generic drug: glucose blood USE AS DIRECTED UP TO 4 TIMES A DAY   blood glucose meter kit and supplies Kit Dispense based on patient and insurance preference. Use up to four times daily as directed. (FOR ICD-9 250.00, 250.01).   Dialyvite Vitamin D 5000 125 MCG (5000 UT) capsule Generic drug: Cholecalciferol Take 5,000 Units by mouth 3 (three) times a week.   diclofenac Sodium 1 % Gel Commonly known as: VOLTAREN Apply 1 application topically 4 (four)  times daily as needed (pain).   Eszopiclone 3 MG Tabs Take 3 mg by mouth at bedtime.   methocarbamol 500 MG tablet Commonly known as: ROBAXIN Take 1-2 tablets (500-1,000 mg total) by mouth 4 (four) times daily.   multivitamin with minerals Tabs tablet Take 1 tablet by mouth daily.   oxyCODONE 10 mg 12 hr tablet Commonly known as: OXYCONTIN Take 1 tablet (10 mg total) by mouth every 12 (twelve) hours for 7 days. Replaces: Oxycodone HCl 10 MG Tabs   oxyCODONE-acetaminophen 7.5-325 MG tablet Commonly known as: PERCOCET Take 1 tablet by mouth every 4 (four) hours as needed for up to 7 days for moderate pain or severe pain.   polyethylene glycol 17 g packet Commonly known as: MIRALAX / GLYCOLAX Take 17 g  by mouth daily as needed for moderate constipation.   valsartan 320 MG tablet Commonly known as: DIOVAN Take 320 mg by mouth 2 (two) times a week.   Vitamin D (Ergocalciferol) 1.25 MG (50000 UNIT) Caps capsule Commonly known as: DRISDOL Take 1 capsule (50,000 Units total) by mouth every 7 (seven) days.       Diagnostic Studies: DG Lumbar Spine 2-3 Views  Result Date: 12/05/2020 CLINICAL DATA:  Question retained foreign body, lumbar spine surgery, L2-L3 XLIF EXAM: LUMBAR SPINE - 2-3 VIEW COMPARISON:  CT abdomen and pelvis 11/21/2019 FINDINGS: Single level disc prosthesis. No retained radiopaque foreign bodies identified. Mild atherosclerotic calcification aorta. LEFT marker noted. IMPRESSION: No retained radiopaque foreign bodies identified. Electronically Signed   By: Lavonia Dana M.D.   On: 12/05/2020 10:54   DG Lumbar Spine 2-3 Views  Result Date: 12/05/2020 CLINICAL DATA:  L2-3 fusion EXAM: LUMBAR SPINE - 2-3 VIEW COMPARISON:  MRI 07/31/2019 FINDINGS: Two intraoperative spot images are submitted centered in the lumbar spine. It is difficult to determine the exact levels due to coned spot technique. Normal alignment. Based on the position of the iliac crests on the lateral view, the central vertebral body on the lateral view is likely L3, but this is difficult to confirm. IMPRESSION: Intraoperative imaging as above. Electronically Signed   By: Rolm Baptise M.D.   On: 12/05/2020 08:41   DG C-Arm 1-60 Min  Result Date: 12/05/2020 CLINICAL DATA:  Provided history: Elective surgery. Additional history provided: L2-3 XLIF. Provided fluoroscopy time EXAM: DG C-ARM 1-60 MIN COMPARISON:  CT abdomen/pelvis 11/21/2019. FINDINGS: PA and lateral view intraoperative fluoroscopic images of the lumbar spine are submitted, 2 images total. The images demonstrate a posterior spinal fusion construct at what is presumably the L2-L3 level given the provided history (the exact level is difficult to ascertain given  the provided field of view). The posterior spinal fusion construct consists of bilateral pedicle screws and vertical interconnecting rods. An interbody device is also present at this level. Mild L2-L3 grade 1 retrolisthesis. IMPRESSION: Two intraoperative fluoroscopic images of the lumbar spine, as described. Electronically Signed   By: Kellie Simmering DO   On: 12/05/2020 13:59    Disposition: Discharge disposition: 01-Home or Self Care       Discharge Instructions    Discharge patient   Complete by: As directed    Discharge disposition: 01-Home or Self Care   Discharge patient date: 12/06/2020     POD #1 s/p LAT/POST fusion with resolved leg pain and moderate well controlled LBP  - up with PT/OT, encourage ambulation - Percocet for pain, Robaxin for muscle spasms x 7 days  - Oxycontin x 7 days - After 7 days  px meds through px mngmt, pt aware  - likely d/c home today with f/u in 2 weeks  Signed: Justice Britain 12/09/2020, 1:11 PM

## 2021-01-03 ENCOUNTER — Encounter (HOSPITAL_COMMUNITY): Payer: Self-pay | Admitting: Orthopedic Surgery

## 2021-02-09 IMAGING — MR MR PELVIS WO/W CM
6 of 8 series · 32 of 48 positions shown · IV contrast (multihance)
Comparison: Lumbar MRI 01/05/2016.

CLINICAL DATA: Low back and bilateral hip pain with weakness in the
legs and right leg numbness for 4-5 years. No acute injury or prior
relevant surgery.

Creatinine was obtained on site at [HOSPITAL] at [HOSPITAL].
Results: Creatinine 1.0 mg/dL.
EXAM:
MRI PELVIS WITHOUT AND WITH CONTRAST
TECHNIQUE: Multiplanar multisequence MR imaging of the pelvis was performed
both before and after administration of intravenous contrast.
CONTRAST:  20mL MULTIHANCE GADOBENATE DIMEGLUMINE 529 MG/ML IV SOLN

[Series 4: T2 fat-sat · axial · right · 4.0mm · 0.62mm/px · z∈[-103,+57]mm · 6 of 33 slices shown (1 of 2)]
[im 1/33]
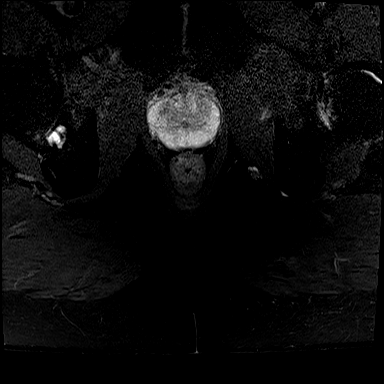
[im 7/33]
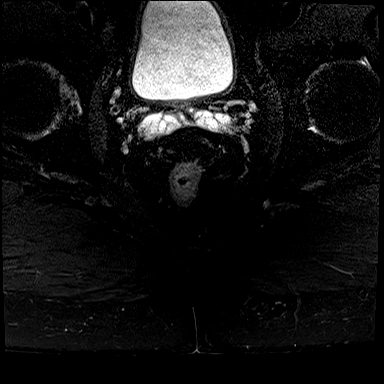
[im 13/33]
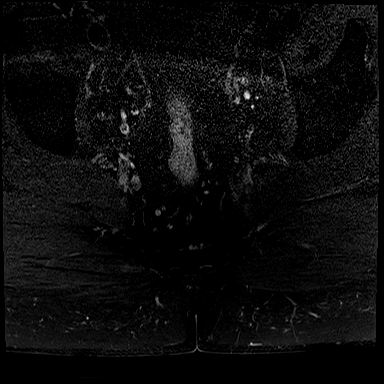
[im 20/33]
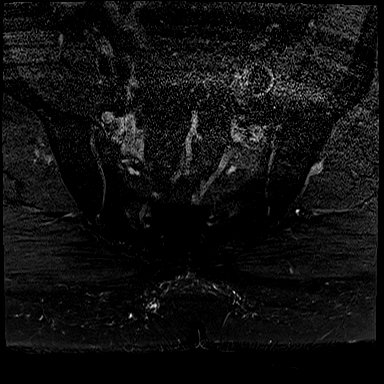
[im 26/33]
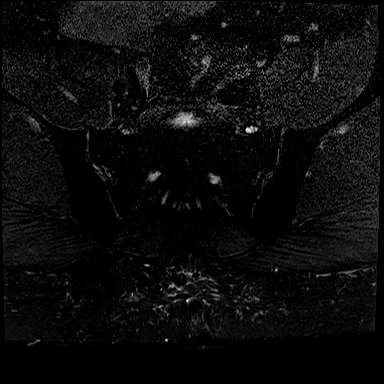
[im 33/33]
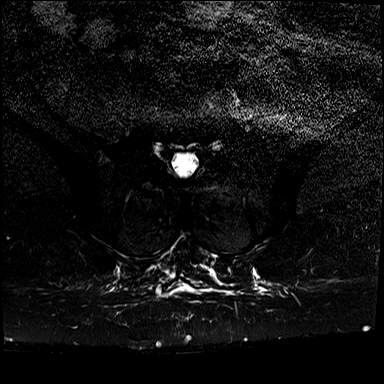

[Series 5: T1 · axial · right · 4.0mm · 0.62mm/px · z∈[-103,+57]mm · 6 of 33 slices shown (1 of 2)]
[im 1/33]
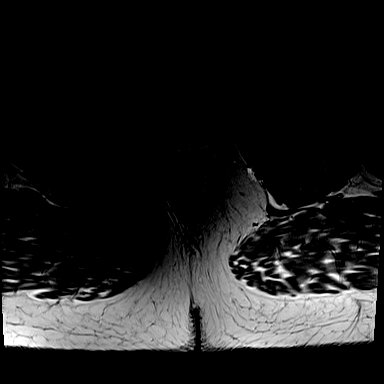
[im 7/33]
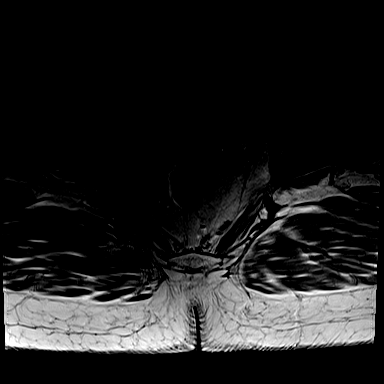
[im 13/33]
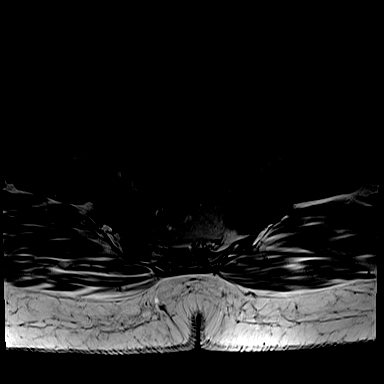
[im 20/33]
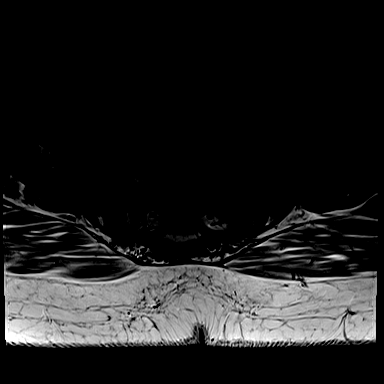
[im 26/33]
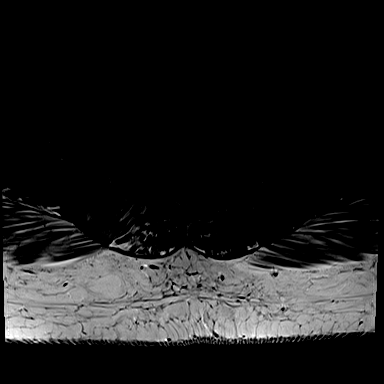
[im 33/33]
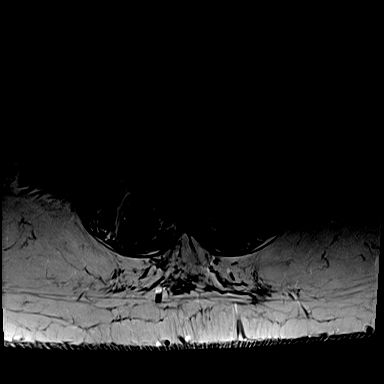

[Series 6: T2 fat-sat · sagittal · right · 4.0mm · 0.75mm/px · 7 of 33 slices shown (2 of 2)]
[im 1/33]
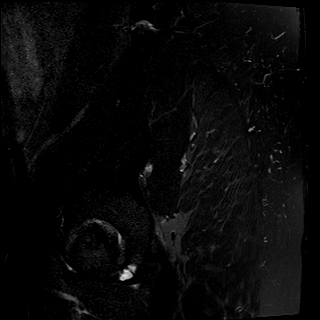
[im 6/33]
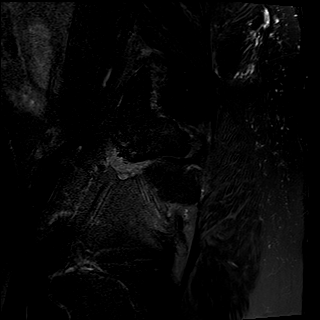
[im 11/33]
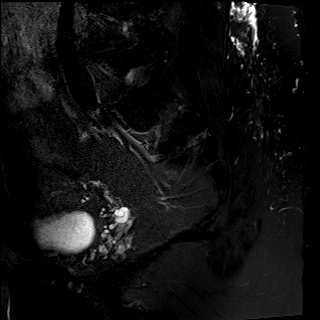
[im 17/33]
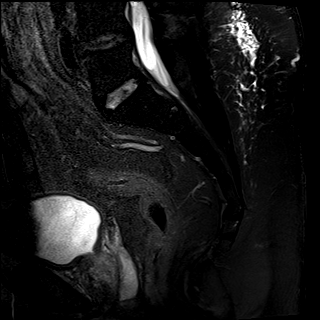
[im 22/33]
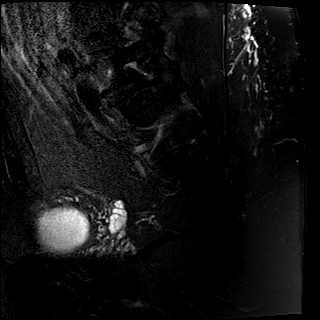
[im 27/33]
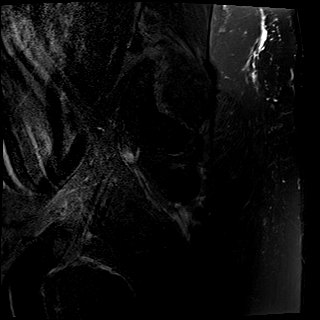
[im 33/33]
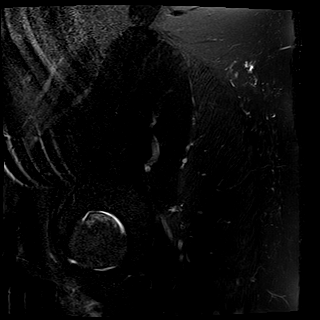

[Series 7: T1 · coronal · right · 4.0mm · 0.59mm/px · 5 of 25 slices shown (2 of 2)]
[im 1/25]
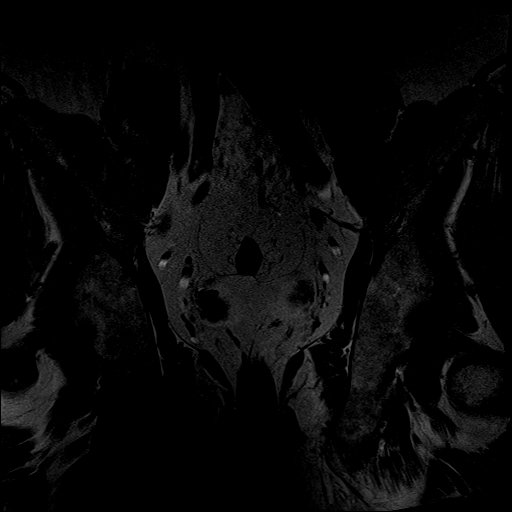
[im 7/25]
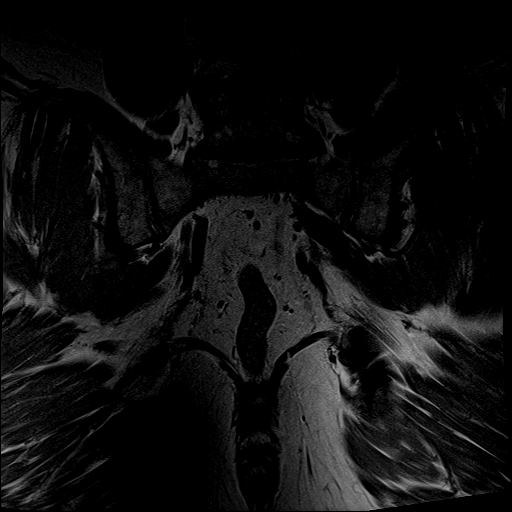
[im 13/25]
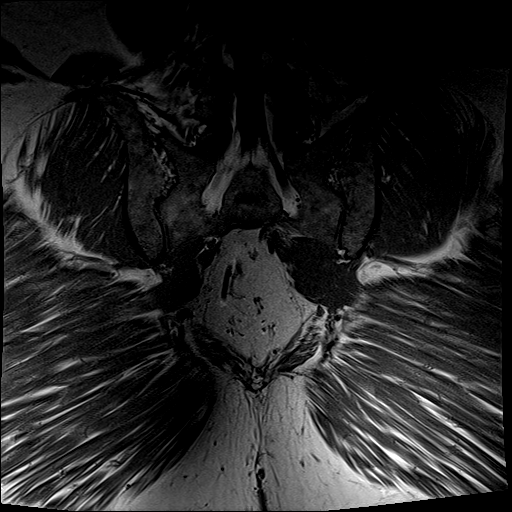
[im 19/25]
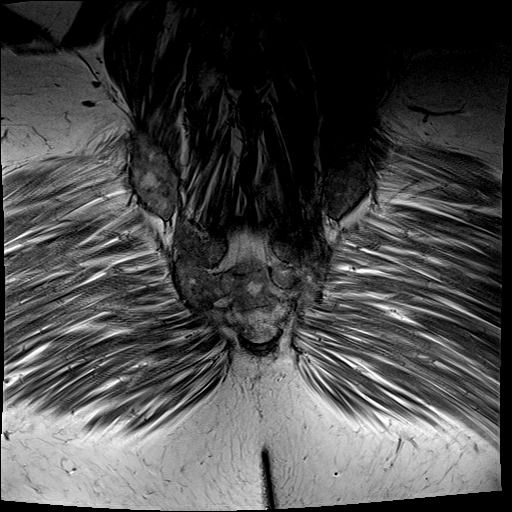
[im 25/25]
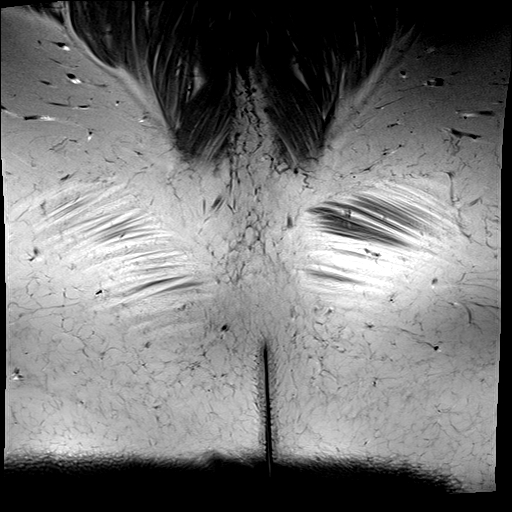

[Series 8: STIR · coronal · right · 4.0mm · 0.94mm/px · 5 of 25 slices shown]
[im 1/25]
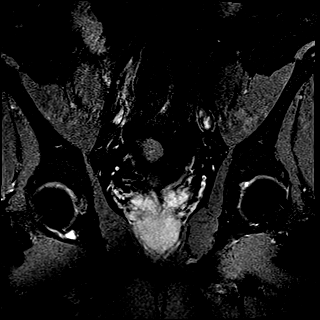
[im 7/25]
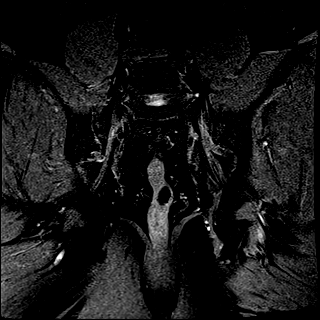
[im 13/25]
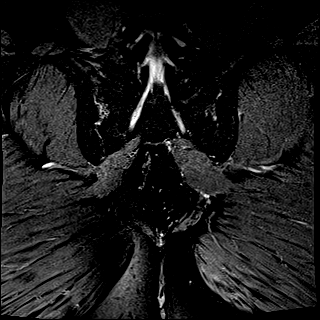
[im 19/25]
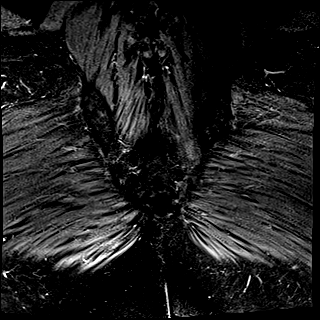
[im 25/25]
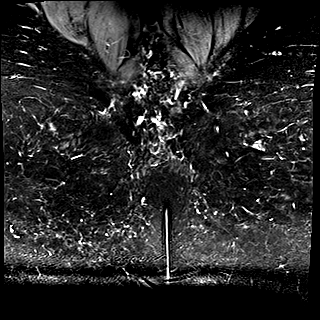

[Series 10: T1 fat-sat · axial · right · 4.0mm · 0.78mm/px · z∈[-104,-54]mm · 3 of 33 slices shown]
[im 1/33]
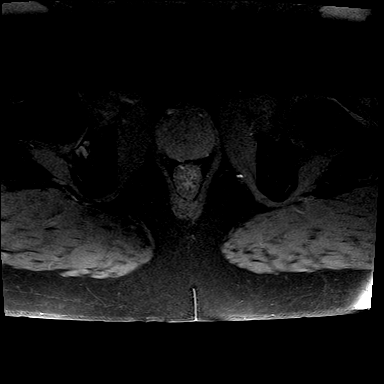
[im 6/33]
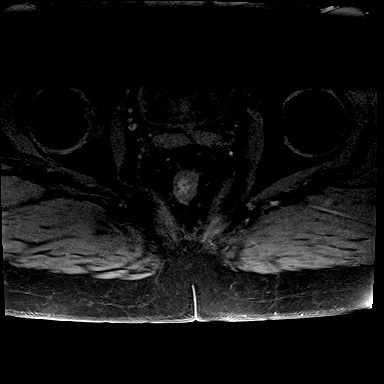
[im 11/33]
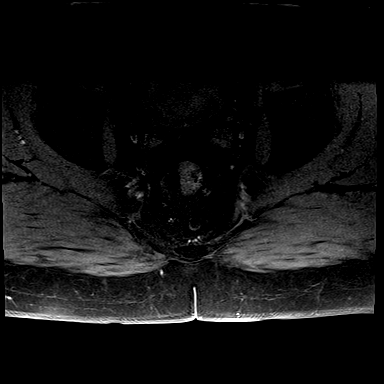

[32 of 48 positions shown; findings below may reference images not displayed]

FINDINGS: I was called to evaluate this patient following the examination and
contrast administration due to diaphoresis and lightheadedness. On
initial evaluation on the patient's stretcher, the patient was
clammy with a thready pulse. The patient was alert and denied chest
pain, shortness of breath and difficulty breathing. The legs were
elevated and reassurance was provided. The symptoms slowly resolved
with return of a strong normal pulse. There was no rash or other
signs of contrast reaction. The patient was monitored supine, and
subsequently sitting erect for approximately 30 minutes, and was
back to his normal state before leaving the department.

Urinary Tract: The visualized distal ureters and bladder appear
unremarkable.

Bowel: No bowel wall thickening, distention or surrounding
inflammation identified within the pelvis.

Vascular/Lymphatic: No enlarged pelvic lymph nodes identified. No
significant vascular findings.

Reproductive: The prostate gland and seminal vesicles appear normal.

Other: No pelvic ascites.

Musculoskeletal: This examination was targeted to the posterior
pelvis, sacrum and sacroiliac joints. The sacroiliac joints appear
normal. There is no evidence of sacroiliitis or abnormal
enhancement. There is no evidence of fracture or neural foraminal
narrowing. The hip joints are partially imaged and appear normal. No
periarticular muscular abnormalities are seen.
IMPRESSION: 1. Normal MRI of the sacroiliac joints.  No acute osseous findings.
2. The visualized pelvic contents appear unremarkable.
3. Patient suffered a mild vasovagal reaction which was treated
conservatively with full recovery prior to leaving the department.

## 2021-05-22 ENCOUNTER — Other Ambulatory Visit: Payer: Self-pay | Admitting: Orthopedic Surgery

## 2021-05-22 DIAGNOSIS — G8929 Other chronic pain: Secondary | ICD-10-CM

## 2021-05-22 DIAGNOSIS — M533 Sacrococcygeal disorders, not elsewhere classified: Secondary | ICD-10-CM

## 2021-06-05 ENCOUNTER — Other Ambulatory Visit: Payer: Self-pay

## 2021-06-05 ENCOUNTER — Ambulatory Visit
Admission: RE | Admit: 2021-06-05 | Discharge: 2021-06-05 | Disposition: A | Discharge status: Home / Self Care | Payer: BC Managed Care – PPO | Source: Ambulatory Visit | Attending: Orthopedic Surgery | Admitting: Orthopedic Surgery

## 2021-06-05 DIAGNOSIS — M533 Sacrococcygeal disorders, not elsewhere classified: Secondary | ICD-10-CM

## 2021-06-05 DIAGNOSIS — G8929 Other chronic pain: Secondary | ICD-10-CM

## 2021-06-27 ENCOUNTER — Other Ambulatory Visit: Payer: Self-pay | Admitting: Orthopedic Surgery

## 2021-06-27 DIAGNOSIS — M545 Low back pain, unspecified: Secondary | ICD-10-CM

## 2021-07-08 ENCOUNTER — Ambulatory Visit
Admission: RE | Admit: 2021-07-08 | Discharge: 2021-07-08 | Disposition: A | Discharge status: Home / Self Care | Payer: BC Managed Care – PPO | Source: Ambulatory Visit | Attending: Orthopedic Surgery | Admitting: Orthopedic Surgery

## 2021-07-08 ENCOUNTER — Other Ambulatory Visit: Payer: Self-pay

## 2021-07-08 DIAGNOSIS — M545 Low back pain, unspecified: Secondary | ICD-10-CM

## 2021-07-23 ENCOUNTER — Other Ambulatory Visit: Payer: Self-pay | Admitting: Orthopedic Surgery

## 2021-07-23 DIAGNOSIS — G8929 Other chronic pain: Secondary | ICD-10-CM

## 2021-07-23 DIAGNOSIS — M545 Low back pain, unspecified: Secondary | ICD-10-CM

## 2021-07-28 ENCOUNTER — Other Ambulatory Visit: Payer: Self-pay | Admitting: Orthopedic Surgery

## 2021-07-28 ENCOUNTER — Ambulatory Visit
Admission: RE | Admit: 2021-07-28 | Discharge: 2021-07-28 | Disposition: A | Discharge status: Home / Self Care | Payer: BC Managed Care – PPO | Source: Ambulatory Visit | Attending: Orthopedic Surgery | Admitting: Orthopedic Surgery

## 2021-07-28 ENCOUNTER — Other Ambulatory Visit: Payer: Self-pay

## 2021-07-28 DIAGNOSIS — G8929 Other chronic pain: Secondary | ICD-10-CM

## 2021-07-28 DIAGNOSIS — M545 Low back pain, unspecified: Secondary | ICD-10-CM

## 2021-07-28 MED ORDER — IOPAMIDOL (ISOVUE-M 200) INJECTION 41%
1.0000 mL | Freq: Once | INTRAMUSCULAR | Status: AC
  Administered 2021-07-28: 1 mL via EPIDURAL

## 2021-07-28 MED ORDER — METHYLPREDNISOLONE ACETATE 40 MG/ML INJ SUSP (RADIOLOG
80.0000 mg | Freq: Once | INTRAMUSCULAR | Status: AC
  Administered 2021-07-28: 80 mg via EPIDURAL

## 2021-07-28 NOTE — Discharge Instructions (Signed)

## 2021-08-13 ENCOUNTER — Other Ambulatory Visit: Payer: Self-pay | Admitting: Orthopedic Surgery

## 2021-08-15 ENCOUNTER — Other Ambulatory Visit: Payer: Self-pay | Admitting: Orthopedic Surgery

## 2021-08-15 DIAGNOSIS — M48062 Spinal stenosis, lumbar region with neurogenic claudication: Secondary | ICD-10-CM

## 2021-08-27 ENCOUNTER — Ambulatory Visit
Admission: RE | Admit: 2021-08-27 | Discharge: 2021-08-27 | Disposition: A | Discharge status: Home / Self Care | Payer: BC Managed Care – PPO | Source: Ambulatory Visit | Attending: Orthopedic Surgery | Admitting: Orthopedic Surgery

## 2021-08-27 ENCOUNTER — Other Ambulatory Visit: Payer: Self-pay

## 2021-08-27 DIAGNOSIS — M48062 Spinal stenosis, lumbar region with neurogenic claudication: Secondary | ICD-10-CM

## 2021-08-27 MED ORDER — IOPAMIDOL (ISOVUE-M 200) INJECTION 41%
1.0000 mL | Freq: Once | INTRAMUSCULAR | Status: AC
  Administered 2021-08-27: 1 mL via INTRA_ARTICULAR

## 2021-08-27 MED ORDER — METHYLPREDNISOLONE ACETATE 40 MG/ML INJ SUSP (RADIOLOG
120.0000 mg | Freq: Once | INTRAMUSCULAR | Status: AC
  Administered 2021-08-27: 120 mg via INTRA_ARTICULAR

## 2021-08-27 NOTE — Discharge Instructions (Signed)

## 2021-09-02 NOTE — Progress Notes (Signed)
Surgical Instructions    Your procedure is scheduled on Wednesday, November 23rd, 2022.   Report to [facility] Main Entrance "A" at 05:30 A.M., then check in with the Admitting office.  Call this number if you have problems the morning of surgery:  [phone]   If you have any questions prior to your surgery date call [phone]: Open Monday-Friday 8am-4pm    Remember:  Do not eat after midnight the night before your surgery  You may drink clear liquids until 04:30 the morning of your surgery.   Clear liquids allowed are: Water, Non-Citrus Juices (without pulp), Carbonated Beverages, Clear Tea, Black Coffee ONLY (NO MILK, CREAM OR POWDERED CREAMER of any kind), and Gatorade  Patient Instructions  The day of surgery (if you have diabetes):  Drink ONE (1) 12 oz G2 given to you in your pre admission testing appointment by 04:30 the morning of surgery. Drink in one sitting. Do not sip.  This drink was given to you during your hospital  pre-op appointment visit.  Nothing else to drink after completing the  12 oz bottle of G2.         If you have questions, please contact your surgeon's office.     Take these medicines the morning of surgery with A SIP OF WATER:  pregabalin (LYRICA) Oxycodone - if needed  tiZANidine (ZANAFLEX) - if needed  As of today, STOP taking any Aspirin (unless otherwise instructed by your surgeon) Aleve, Naproxen, Ibuprofen, Motrin, Advil, Goody's, BC's, all herbal medications, fish oil, and all vitamins.    HOW TO MANAGE YOUR DIABETES BEFORE AND AFTER SURGERY  Why is it important to control my blood sugar before and after surgery? Improving blood sugar levels before and after surgery helps healing and can limit problems. A way of improving blood sugar control is eating a healthy diet by:  Eating less sugar and carbohydrates  Increasing activity/exercise  Talking with your doctor about reaching your blood sugar goals High blood sugars (greater  than 180 mg/dL) can raise your risk of infections and slow your recovery, so you will need to focus on controlling your diabetes during the weeks before surgery. Make sure that the doctor who takes care of your diabetes knows about your planned surgery including the date and location.  How do I manage my blood sugar before surgery? Check your blood sugar at least 4 times a day, starting 2 days before surgery, to make sure that the level is not too high or low.  Check your blood sugar the morning of your surgery when you wake up and every 2 hours until you get to the Short Stay unit.  If your blood sugar is less than 70 mg/dL, you will need to treat for low blood sugar: Do not take insulin. Treat a low blood sugar (less than 70 mg/dL) with  cup of clear juice (cranberry or apple), 4 glucose tablets, OR glucose gel. Recheck blood sugar in 15 minutes after treatment (to make sure it is greater than 70 mg/dL). If your blood sugar is not greater than 70 mg/dL on recheck, call [phone] for further instructions. Report your blood sugar to the short stay nurse when you get to Short Stay.  If you are admitted to the hospital after surgery: Your blood sugar will be checked by the staff and you will probably be given insulin after surgery (instead of oral diabetes medicines) to make sure you have good blood sugar levels. The goal for blood sugar control after  surgery is 80-180 mg/dL.     After your COVID test   You are not required to quarantine however you are required to wear a well-fitting mask when you are out and around people not in your household.  If your mask becomes wet or soiled, replace with a new one.  Wash your hands often with soap and water for 20 seconds or clean your hands with an alcohol-based hand sanitizer that contains at least 60% alcohol.  Do not share personal items.  Notify your provider: if you are in close contact with someone who has COVID  or if you develop a  fever of 100.4 or greater, sneezing, cough, sore throat, shortness of breath or body aches.    The day of surgery:          Do not wear jewelry  Do not wear lotions, powders, colognes, or deodorant. Men may shave face and neck. Do not bring valuables to the hospital.              [facility] is not responsible for any belongings or valuables.  Do NOT Smoke (Tobacco/Vaping)  24 hours prior to your procedure  If you use a CPAP at night, you may bring your mask for your overnight stay.   Contacts, glasses, hearing aids, dentures or partials may not be worn into surgery, please bring cases for these belongings   For patients admitted to the hospital, discharge time will be determined by your treatment team.   Patients discharged the day of surgery will not be allowed to drive home, and someone needs to stay with them for 24 hours.  NO VISITORS WILL BE ALLOWED IN PRE-OP WHERE PATIENTS ARE PREPPED FOR SURGERY.  ONLY 1 SUPPORT PERSON MAY BE PRESENT IN THE WAITING ROOM WHILE YOU ARE IN SURGERY.  IF YOU ARE TO BE ADMITTED, ONCE YOU ARE IN YOUR ROOM YOU WILL BE ALLOWED TWO (2) VISITORS. 1 (ONE) VISITOR MAY STAY OVERNIGHT BUT MUST ARRIVE TO THE ROOM BY 8pm.  Minor children may have two parents present. Special consideration for safety and communication needs will be reviewed on a case by case basis.  Special instructions:    Oral Hygiene is also important to reduce your risk of infection.  Remember - BRUSH YOUR TEETH THE MORNING OF SURGERY WITH YOUR REGULAR TOOTHPASTE   [place]- Preparing For Surgery  Before surgery, you can play an important role. Because skin is not sterile, your skin needs to be as free of germs as possible. You can reduce the number of germs on your skin by washing with CHG (chlorahexidine gluconate) Soap before surgery.  CHG is an antiseptic cleaner which kills germs and bonds with the skin to continue killing germs even after washing.     Please do not use if you  have an allergy to CHG or antibacterial soaps. If your skin becomes reddened/irritated stop using the CHG.  Do not shave (including legs and underarms) for at least 48 hours prior to first CHG shower. It is OK to shave your face.  Please follow these instructions carefully.     Shower the NIGHT BEFORE SURGERY and the MORNING OF SURGERY with CHG Soap.   If you chose to wash your hair, wash your hair first as usual with your normal shampoo. After you shampoo, rinse your hair and body thoroughly to remove the shampoo.  Then [employer] and genitals (private parts) with your normal soap and rinse thoroughly to remove soap.  After  that Use CHG Soap as you would any other liquid soap. You can apply CHG directly to the skin and wash gently with a scrungie or a clean washcloth.   Apply the CHG Soap to your body ONLY FROM THE NECK DOWN.  Do not use on open wounds or open sores. Avoid contact with your eyes, ears, mouth and genitals (private parts). Wash Face and genitals (private parts)  with your normal soap.   Wash thoroughly, paying special attention to the area where your surgery will be performed.  Thoroughly rinse your body with warm water from the neck down.  DO NOT shower/wash with your normal soap after using and rinsing off the CHG Soap.  Pat yourself dry with a CLEAN TOWEL.  Wear CLEAN PAJAMAS to bed the night before surgery  Place CLEAN SHEETS on your bed the night before your surgery  DO NOT SLEEP WITH PETS.   Day of Surgery:  Take a shower with CHG soap. Wear Clean/Comfortable clothing the morning of surgery Do not apply any deodorants/lotions.   Remember to brush your teeth WITH YOUR REGULAR TOOTHPASTE.   Please read over the following fact sheets that you were given.

## 2021-09-03 ENCOUNTER — Other Ambulatory Visit: Payer: Self-pay

## 2021-09-03 ENCOUNTER — Encounter (HOSPITAL_COMMUNITY): Payer: Self-pay

## 2021-09-03 ENCOUNTER — Encounter (HOSPITAL_COMMUNITY)
Admission: RE | Admit: 2021-09-03 | Discharge: 2021-09-03 | Disposition: A | Discharge status: Home / Self Care | Payer: BC Managed Care – PPO | Source: Ambulatory Visit | Attending: Orthopedic Surgery | Admitting: Orthopedic Surgery

## 2021-09-03 VITALS — BP 138/68 | HR 68 | Temp 98.2°F | Resp 19 | Ht 73.0 in | Wt 368.0 lb

## 2021-09-03 DIAGNOSIS — Z01818 Encounter for other preprocedural examination: Secondary | ICD-10-CM | POA: Insufficient documentation

## 2021-09-03 LAB — CBC WITH DIFFERENTIAL/PLATELET
Abs Immature Granulocytes: 0.04 10*3/uL (ref 0.00–0.07)
Basophils Absolute: 0 10*3/uL (ref 0.0–0.1)
Basophils Relative: 0 %
Eosinophils Absolute: 0.1 10*3/uL (ref 0.0–0.5)
Eosinophils Relative: 1 %
HCT: 49.5 % (ref 39.0–52.0)
Hemoglobin: 16.8 g/dL (ref 13.0–17.0)
Immature Granulocytes: 0 %
Lymphocytes Relative: 19 %
Lymphs Abs: 2 10*3/uL (ref 0.7–4.0)
MCH: 31.1 pg (ref 26.0–34.0)
MCHC: 33.9 g/dL (ref 30.0–36.0)
MCV: 91.5 fL (ref 80.0–100.0)
Monocytes Absolute: 0.9 10*3/uL (ref 0.1–1.0)
Monocytes Relative: 9 %
Neutro Abs: 7.2 10*3/uL (ref 1.7–7.7)
Neutrophils Relative %: 71 %
Platelets: 208 10*3/uL (ref 150–400)
RBC: 5.41 MIL/uL (ref 4.22–5.81)
RDW: 12.5 % (ref 11.5–15.5)
WBC: 10.3 10*3/uL (ref 4.0–10.5)
nRBC: 0 % (ref 0.0–0.2)

## 2021-09-03 LAB — TYPE AND SCREEN
ABO/RH(D): O NEG
Antibody Screen: NEGATIVE

## 2021-09-03 LAB — URINALYSIS, ROUTINE W REFLEX MICROSCOPIC
Bilirubin Urine: NEGATIVE
Glucose, UA: NEGATIVE mg/dL
Hgb urine dipstick: NEGATIVE
Ketones, ur: NEGATIVE mg/dL
Leukocytes,Ua: NEGATIVE
Nitrite: NEGATIVE
Protein, ur: NEGATIVE mg/dL
Specific Gravity, Urine: 1.012 (ref 1.005–1.030)
pH: 5 (ref 5.0–8.0)

## 2021-09-03 LAB — PROTIME-INR
INR: 1 (ref 0.8–1.2)
Prothrombin Time: 13.2 seconds (ref 11.4–15.2)

## 2021-09-03 LAB — APTT: aPTT: 28 seconds (ref 24–36)

## 2021-09-03 LAB — GLUCOSE, CAPILLARY: Glucose-Capillary: 100 mg/dL — ABNORMAL HIGH (ref 70–99)

## 2021-09-03 LAB — SURGICAL PCR SCREEN
MRSA, PCR: NEGATIVE
Staphylococcus aureus: NEGATIVE

## 2021-09-03 NOTE — Progress Notes (Signed)
PCP - Dr. Harlan Stains Cardiologist - denies  PPM/ICD - denies   Chest x-ray - 03/16/13 EKG - 09/03/21 at PAT Stress Test - denies ECHO - denies Cardiac Cath - denies  Sleep Study - pt states he had a sleep study 10+ years ago, he was diagnosed with sleep apnea CPAP - yes, every night  DM- Type 2 Fasting Blood Sugar - 100-110 Checks Blood Sugar about once a week  Blood Thinner Instructions: n/a Aspirin Instructions: n/a  ERAS Protcol - yes PRE-SURGERY  G2- given at PAT  COVID TEST- pt scheduled for testing on 09/08/21   Anesthesia review: no  Patient denies shortness of breath, fever, cough and chest pain at PAT appointment   All instructions explained to the patient, with a verbal understanding of the material. Patient agrees to go over the instructions while at home for a better understanding. Patient also instructed to wear a mask in public after being tested for COVID-19. The opportunity to ask questions was provided.

## 2021-09-08 ENCOUNTER — Other Ambulatory Visit (HOSPITAL_COMMUNITY)
Admission: RE | Admit: 2021-09-08 | Discharge: 2021-09-08 | Disposition: A | Discharge status: Home / Self Care | Payer: BC Managed Care – PPO | Source: Ambulatory Visit | Attending: Orthopedic Surgery | Admitting: Orthopedic Surgery

## 2021-09-08 DIAGNOSIS — Z01812 Encounter for preprocedural laboratory examination: Secondary | ICD-10-CM | POA: Insufficient documentation

## 2021-09-08 DIAGNOSIS — Z20822 Contact with and (suspected) exposure to covid-19: Secondary | ICD-10-CM | POA: Insufficient documentation

## 2021-09-08 DIAGNOSIS — Z01818 Encounter for other preprocedural examination: Secondary | ICD-10-CM

## 2021-09-08 LAB — SARS CORONAVIRUS 2 (TAT 6-24 HRS): SARS Coronavirus 2: NEGATIVE

## 2021-09-09 NOTE — Anesthesia Preprocedure Evaluation (Addendum)
Anesthesia Evaluation  Patient identified by MRN, date of birth, ID band Patient awake    Reviewed: Allergy & Precautions, NPO status , Patient's Chart, lab work & pertinent test results  History of Anesthesia Complications Negative for: history of anesthetic complications  Airway Mallampati: III  TM Distance: >3 FB Neck ROM: Full    Dental no notable dental hx. (+) Dental Advisory Given   Pulmonary sleep apnea ,    Pulmonary exam normal        Cardiovascular hypertension, Pt. on medications Normal cardiovascular exam     Neuro/Psych Anxiety    GI/Hepatic Neg liver ROS, PUD,   Endo/Other  diabetes, Type 2Morbid obesity  Renal/GU negative Renal ROS  negative genitourinary   Musculoskeletal  (+) Arthritis ,   Abdominal   Peds  Hematology negative hematology ROS (+)   Anesthesia Other Findings Day of surgery medications reviewed with patient.  Reproductive/Obstetrics negative OB ROS                            Anesthesia Physical  Anesthesia Plan  ASA: 3  Anesthesia Plan: General   Post-op Pain Management: Celebrex PO (pre-op) and Tylenol PO (pre-op)   Induction: Intravenous  PONV Risk Score and Plan: 3 and Midazolam, Treatment may vary due to age or medical condition, Ondansetron and Dexamethasone  Airway Management Planned: Oral ETT  Additional Equipment: Arterial line  Intra-op Plan:   Post-operative Plan: Extubation in OR  Informed Consent: I have reviewed the patients History and Physical, chart, labs and discussed the procedure including the risks, benefits and alternatives for the proposed anesthesia with the patient or authorized representative who has indicated his/her understanding and acceptance.     Dental advisory given  Plan Discussed with: CRNA and Anesthesiologist  Anesthesia Plan Comments:        Anesthesia Quick Evaluation

## 2021-09-10 ENCOUNTER — Encounter (HOSPITAL_COMMUNITY): Payer: Self-pay | Admitting: Orthopedic Surgery

## 2021-09-10 ENCOUNTER — Other Ambulatory Visit: Payer: Self-pay

## 2021-09-10 ENCOUNTER — Inpatient Hospital Stay (HOSPITAL_COMMUNITY): Payer: BC Managed Care – PPO | Admitting: Certified Registered Nurse Anesthetist

## 2021-09-10 ENCOUNTER — Inpatient Hospital Stay (HOSPITAL_COMMUNITY)
Admission: RE | Disposition: A | Discharge status: Home / Self Care | Payer: Self-pay | Source: Ambulatory Visit | Attending: Orthopedic Surgery

## 2021-09-10 ENCOUNTER — Inpatient Hospital Stay (HOSPITAL_COMMUNITY): Payer: BC Managed Care – PPO

## 2021-09-10 ENCOUNTER — Inpatient Hospital Stay (HOSPITAL_COMMUNITY)
Admission: RE | Admit: 2021-09-10 | Discharge: 2021-09-10 | DRG: 455 | Disposition: A | Discharge status: Home / Self Care | Payer: BC Managed Care – PPO | Source: Ambulatory Visit | Attending: Orthopedic Surgery | Admitting: Orthopedic Surgery

## 2021-09-10 DIAGNOSIS — M4726 Other spondylosis with radiculopathy, lumbar region: Principal | ICD-10-CM | POA: Diagnosis present

## 2021-09-10 DIAGNOSIS — Z8249 Family history of ischemic heart disease and other diseases of the circulatory system: Secondary | ICD-10-CM | POA: Diagnosis not present

## 2021-09-10 DIAGNOSIS — M109 Gout, unspecified: Secondary | ICD-10-CM | POA: Diagnosis present

## 2021-09-10 DIAGNOSIS — Z833 Family history of diabetes mellitus: Secondary | ICD-10-CM

## 2021-09-10 DIAGNOSIS — E559 Vitamin D deficiency, unspecified: Secondary | ICD-10-CM | POA: Diagnosis present

## 2021-09-10 DIAGNOSIS — Z981 Arthrodesis status: Secondary | ICD-10-CM

## 2021-09-10 DIAGNOSIS — Z419 Encounter for procedure for purposes other than remedying health state, unspecified: Secondary | ICD-10-CM

## 2021-09-10 DIAGNOSIS — M1990 Unspecified osteoarthritis, unspecified site: Secondary | ICD-10-CM | POA: Diagnosis present

## 2021-09-10 DIAGNOSIS — E7800 Pure hypercholesterolemia, unspecified: Secondary | ICD-10-CM | POA: Diagnosis present

## 2021-09-10 DIAGNOSIS — Z20822 Contact with and (suspected) exposure to covid-19: Secondary | ICD-10-CM | POA: Diagnosis present

## 2021-09-10 DIAGNOSIS — Z809 Family history of malignant neoplasm, unspecified: Secondary | ICD-10-CM | POA: Diagnosis not present

## 2021-09-10 DIAGNOSIS — G4730 Sleep apnea, unspecified: Secondary | ICD-10-CM | POA: Diagnosis present

## 2021-09-10 DIAGNOSIS — E119 Type 2 diabetes mellitus without complications: Secondary | ICD-10-CM | POA: Diagnosis present

## 2021-09-10 DIAGNOSIS — Z83438 Family history of other disorder of lipoprotein metabolism and other lipidemia: Secondary | ICD-10-CM

## 2021-09-10 DIAGNOSIS — M48061 Spinal stenosis, lumbar region without neurogenic claudication: Secondary | ICD-10-CM | POA: Diagnosis present

## 2021-09-10 DIAGNOSIS — I10 Essential (primary) hypertension: Secondary | ICD-10-CM | POA: Diagnosis present

## 2021-09-10 DIAGNOSIS — M4800 Spinal stenosis, site unspecified: Secondary | ICD-10-CM | POA: Diagnosis present

## 2021-09-10 HISTORY — PX: ANTERIOR LAT LUMBAR FUSION: SHX1168

## 2021-09-10 LAB — GLUCOSE, CAPILLARY
Glucose-Capillary: 99 mg/dL (ref 70–99)
Glucose-Capillary: 93 mg/dL (ref 70–99)
Glucose-Capillary: 93 mg/dL (ref 70–99)

## 2021-09-10 SURGERY — ANTERIOR LATERAL LUMBAR FUSION 1 LEVEL
Anesthesia: General | Site: Back | Laterality: Right

## 2021-09-10 MED ORDER — OXYCODONE HCL 5 MG PO TABS: 10.0000 mg | ORAL_TABLET | ORAL | Status: DC | PRN

## 2021-09-10 MED ORDER — SENNOSIDES-DOCUSATE SODIUM 8.6-50 MG PO TABS: 1.0000 | ORAL_TABLET | Freq: Every evening | ORAL | Status: DC | PRN

## 2021-09-10 MED ORDER — METHOCARBAMOL 500 MG PO TABS: 500.0000 mg | ORAL_TABLET | Freq: Four times a day (QID) | ORAL | Status: DC

## 2021-09-10 MED ORDER — METHYLENE BLUE 0.5 % INJ SOLN
INTRAVENOUS | Status: AC
  Filled 2021-09-10: qty 10

## 2021-09-10 MED ORDER — LACTATED RINGERS IV SOLN: INTRAVENOUS | Status: DC

## 2021-09-10 MED ORDER — BUPIVACAINE-EPINEPHRINE 0.25% -1:200000 IJ SOLN
INTRAMUSCULAR | Status: DC | PRN
  Administered 2021-09-10: 8 mL

## 2021-09-10 MED ORDER — MENTHOL 3 MG MT LOZG: 1.0000 | LOZENGE | OROMUCOSAL | Status: DC | PRN

## 2021-09-10 MED ORDER — PROMETHAZINE HCL 25 MG/ML IJ SOLN: 6.2500 mg | INTRAMUSCULAR | Status: DC | PRN

## 2021-09-10 MED ORDER — POLYETHYLENE GLYCOL 3350 17 G PO PACK: 17.0000 g | PACK | Freq: Every day | ORAL | Status: DC | PRN

## 2021-09-10 MED ORDER — BUPIVACAINE-EPINEPHRINE (PF) 0.25% -1:200000 IJ SOLN
INTRAMUSCULAR | Status: AC
  Filled 2021-09-10: qty 30

## 2021-09-10 MED ORDER — CELECOXIB 200 MG PO CAPS
200.0000 mg | ORAL_CAPSULE | Freq: Once | ORAL | Status: AC
  Administered 2021-09-10: 200 mg via ORAL
  Filled 2021-09-10: qty 1

## 2021-09-10 MED ORDER — POTASSIUM CHLORIDE IN NACL 20-0.9 MEQ/L-% IV SOLN: INTRAVENOUS | Status: DC

## 2021-09-10 MED ORDER — FENTANYL CITRATE (PF) 100 MCG/2ML IJ SOLN
INTRAMUSCULAR | Status: DC | PRN
  Administered 2021-09-10: 50 ug via INTRAVENOUS
  Administered 2021-09-10: 100 ug via INTRAVENOUS

## 2021-09-10 MED ORDER — ACETAMINOPHEN 10 MG/ML IV SOLN
1000.0000 mg | Freq: Once | INTRAVENOUS | Status: AC
  Administered 2021-09-10: 1000 mg via INTRAVENOUS

## 2021-09-10 MED ORDER — NALOXONE HCL 4 MG/0.1ML NA LIQD: 1.0000 | NASAL | Status: DC | PRN

## 2021-09-10 MED ORDER — FLEET ENEMA 7-19 GM/118ML RE ENEM: 1.0000 | ENEMA | Freq: Once | RECTAL | Status: DC | PRN

## 2021-09-10 MED ORDER — CEFAZOLIN SODIUM-DEXTROSE 2-4 GM/100ML-% IV SOLN: 2.0000 g | INTRAVENOUS | Status: DC

## 2021-09-10 MED ORDER — ALBUMIN HUMAN 5 % IV SOLN: INTRAVENOUS | Status: DC | PRN

## 2021-09-10 MED ORDER — 0.9 % SODIUM CHLORIDE (POUR BTL) OPTIME
TOPICAL | Status: DC | PRN
  Administered 2021-09-10: 1000 mL

## 2021-09-10 MED ORDER — FENTANYL CITRATE (PF) 100 MCG/2ML IJ SOLN
INTRAMUSCULAR | Status: AC
  Filled 2021-09-10: qty 2

## 2021-09-10 MED ORDER — ZOLPIDEM TARTRATE 5 MG PO TABS: 5.0000 mg | ORAL_TABLET | Freq: Every evening | ORAL | Status: DC | PRN

## 2021-09-10 MED ORDER — SODIUM CHLORIDE 0.9% FLUSH: 3.0000 mL | INTRAVENOUS | Status: DC | PRN

## 2021-09-10 MED ORDER — PROPOFOL 10 MG/ML IV BOLUS
INTRAVENOUS | Status: AC
  Filled 2021-09-10: qty 20

## 2021-09-10 MED ORDER — CHLORHEXIDINE GLUCONATE 0.12 % MT SOLN
15.0000 mL | Freq: Once | OROMUCOSAL | Status: AC
  Administered 2021-09-10: 15 mL via OROMUCOSAL
  Filled 2021-09-10: qty 15

## 2021-09-10 MED ORDER — THROMBIN 20000 UNITS EX SOLR: CUTANEOUS | Status: DC | PRN

## 2021-09-10 MED ORDER — ACETAMINOPHEN 325 MG PO TABS: 650.0000 mg | ORAL_TABLET | ORAL | Status: DC | PRN

## 2021-09-10 MED ORDER — INSULIN ASPART 100 UNIT/ML IJ SOLN: 0.0000 [IU] | Freq: Three times a day (TID) | INTRAMUSCULAR | Status: DC

## 2021-09-10 MED ORDER — PHENYLEPHRINE HCL-NACL 20-0.9 MG/250ML-% IV SOLN
INTRAVENOUS | Status: DC | PRN
  Administered 2021-09-10: 20 ug/min via INTRAVENOUS

## 2021-09-10 MED ORDER — SODIUM CHLORIDE 0.9 % IV SOLN
0.0125 ug/kg/min | INTRAVENOUS | Status: AC
  Administered 2021-09-10: .05 ug/kg/min via INTRAVENOUS
  Filled 2021-09-10: qty 2000

## 2021-09-10 MED ORDER — BUPIVACAINE LIPOSOME 1.3 % IJ SUSP
INTRAMUSCULAR | Status: DC | PRN
  Administered 2021-09-10: 20 mL

## 2021-09-10 MED ORDER — ACETAMINOPHEN 10 MG/ML IV SOLN
INTRAVENOUS | Status: AC
  Filled 2021-09-10: qty 100

## 2021-09-10 MED ORDER — FENTANYL CITRATE (PF) 250 MCG/5ML IJ SOLN
INTRAMUSCULAR | Status: AC
  Filled 2021-09-10: qty 5

## 2021-09-10 MED ORDER — ACETAMINOPHEN 500 MG PO TABS
1000.0000 mg | ORAL_TABLET | Freq: Once | ORAL | Status: AC
  Administered 2021-09-10: 1000 mg via ORAL
  Filled 2021-09-10: qty 2

## 2021-09-10 MED ORDER — THROMBIN (RECOMBINANT) 20000 UNITS EX SOLR
CUTANEOUS | Status: AC
  Filled 2021-09-10: qty 20000

## 2021-09-10 MED ORDER — MIDAZOLAM HCL 2 MG/2ML IJ SOLN
INTRAMUSCULAR | Status: AC
  Filled 2021-09-10: qty 2

## 2021-09-10 MED ORDER — SUCCINYLCHOLINE CHLORIDE 200 MG/10ML IV SOSY
PREFILLED_SYRINGE | INTRAVENOUS | Status: DC | PRN
  Administered 2021-09-10: 160 mg via INTRAVENOUS

## 2021-09-10 MED ORDER — ROCURONIUM BROMIDE 10 MG/ML (PF) SYRINGE
PREFILLED_SYRINGE | INTRAVENOUS | Status: AC
  Filled 2021-09-10: qty 10

## 2021-09-10 MED ORDER — ADULT MULTIVITAMIN W/MINERALS CH
1.0000 | ORAL_TABLET | Freq: Every day | ORAL | Status: DC
  Administered 2021-09-10: 1 via ORAL
  Filled 2021-09-10: qty 1

## 2021-09-10 MED ORDER — ALUM & MAG HYDROXIDE-SIMETH 200-200-20 MG/5ML PO SUSP: 30.0000 mL | Freq: Four times a day (QID) | ORAL | Status: DC | PRN

## 2021-09-10 MED ORDER — BISACODYL 5 MG PO TBEC: 5.0000 mg | DELAYED_RELEASE_TABLET | Freq: Every day | ORAL | Status: DC | PRN

## 2021-09-10 MED ORDER — FENTANYL CITRATE (PF) 100 MCG/2ML IJ SOLN
25.0000 ug | INTRAMUSCULAR | Status: DC | PRN
  Administered 2021-09-10 (×3): 50 ug via INTRAVENOUS

## 2021-09-10 MED ORDER — ACETAMINOPHEN 650 MG RE SUPP: 650.0000 mg | RECTAL | Status: DC | PRN

## 2021-09-10 MED ORDER — KETAMINE HCL 50 MG/5ML IJ SOSY
PREFILLED_SYRINGE | INTRAMUSCULAR | Status: AC
  Filled 2021-09-10: qty 5

## 2021-09-10 MED ORDER — DOCUSATE SODIUM 100 MG PO CAPS
100.0000 mg | ORAL_CAPSULE | Freq: Two times a day (BID) | ORAL | Status: DC
  Administered 2021-09-10: 100 mg via ORAL
  Filled 2021-09-10: qty 1

## 2021-09-10 MED ORDER — PHENOL 1.4 % MT LIQD: 1.0000 | OROMUCOSAL | Status: DC | PRN

## 2021-09-10 MED ORDER — CEFAZOLIN SODIUM-DEXTROSE 2-4 GM/100ML-% IV SOLN
2.0000 g | Freq: Three times a day (TID) | INTRAVENOUS | Status: DC
  Administered 2021-09-10: 2 g via INTRAVENOUS
  Filled 2021-09-10: qty 100

## 2021-09-10 MED ORDER — ONDANSETRON HCL 4 MG/2ML IJ SOLN: 4.0000 mg | Freq: Four times a day (QID) | INTRAMUSCULAR | Status: DC | PRN

## 2021-09-10 MED ORDER — SODIUM CHLORIDE 0.9% FLUSH
3.0000 mL | Freq: Two times a day (BID) | INTRAVENOUS | Status: DC
  Administered 2021-09-10: 3 mL via INTRAVENOUS

## 2021-09-10 MED ORDER — PROPOFOL 10 MG/ML IV BOLUS
INTRAVENOUS | Status: DC | PRN
  Administered 2021-09-10: 200 mg via INTRAVENOUS

## 2021-09-10 MED ORDER — MIDAZOLAM HCL 5 MG/5ML IJ SOLN
INTRAMUSCULAR | Status: DC | PRN
  Administered 2021-09-10: 2 mg via INTRAVENOUS

## 2021-09-10 MED ORDER — LIDOCAINE 2% (20 MG/ML) 5 ML SYRINGE
INTRAMUSCULAR | Status: DC | PRN
  Administered 2021-09-10: 100 mg via INTRAVENOUS

## 2021-09-10 MED ORDER — BUPIVACAINE LIPOSOME 1.3 % IJ SUSP
INTRAMUSCULAR | Status: AC
  Filled 2021-09-10: qty 20

## 2021-09-10 MED ORDER — AMISULPRIDE (ANTIEMETIC) 5 MG/2ML IV SOLN: 10.0000 mg | Freq: Once | INTRAVENOUS | Status: DC | PRN

## 2021-09-10 MED ORDER — METHOCARBAMOL 500 MG PO TABS
500.0000 mg | ORAL_TABLET | Freq: Four times a day (QID) | ORAL | Status: DC
  Administered 2021-09-10: 500 mg via ORAL
  Filled 2021-09-10: qty 1

## 2021-09-10 MED ORDER — METHOCARBAMOL 500 MG PO TABS
500.0000 mg | ORAL_TABLET | Freq: Four times a day (QID) | ORAL | 0 refills | Status: DC | PRN
Start: 2021-09-10 — End: 2023-10-12

## 2021-09-10 MED ORDER — HYDROMORPHONE HCL 1 MG/ML IJ SOLN
INTRAMUSCULAR | Status: AC
  Filled 2021-09-10: qty 1

## 2021-09-10 MED ORDER — KETAMINE HCL 10 MG/ML IJ SOLN
INTRAMUSCULAR | Status: DC | PRN
  Administered 2021-09-10: 20 mg via INTRAVENOUS
  Administered 2021-09-10: 30 mg via INTRAVENOUS

## 2021-09-10 MED ORDER — OXYCODONE HCL ER 10 MG PO T12A
10.0000 mg | EXTENDED_RELEASE_TABLET | Freq: Two times a day (BID) | ORAL | Status: DC
  Administered 2021-09-10: 10 mg via ORAL
  Filled 2021-09-10: qty 1

## 2021-09-10 MED ORDER — PREGABALIN 100 MG PO CAPS: 100.0000 mg | ORAL_CAPSULE | Freq: Two times a day (BID) | ORAL | Status: DC

## 2021-09-10 MED ORDER — OXYCODONE HCL ER 10 MG PO T12A: 10.0000 mg | EXTENDED_RELEASE_TABLET | Freq: Two times a day (BID) | ORAL | 0 refills | Status: AC

## 2021-09-10 MED ORDER — MORPHINE SULFATE (PF) 2 MG/ML IV SOLN: 1.0000 mg | INTRAVENOUS | Status: DC | PRN

## 2021-09-10 MED ORDER — SODIUM CHLORIDE 0.9 % IV SOLN: 250.0000 mL | INTRAVENOUS | Status: DC

## 2021-09-10 MED ORDER — HYDROMORPHONE HCL 1 MG/ML IJ SOLN
1.0000 mg | Freq: Once | INTRAMUSCULAR | Status: AC
  Administered 2021-09-10: 1 mg via INTRAVENOUS
  Filled 2021-09-10: qty 1

## 2021-09-10 MED ORDER — ONDANSETRON HCL 4 MG/2ML IJ SOLN
INTRAMUSCULAR | Status: DC | PRN
  Administered 2021-09-10: 4 mg via INTRAVENOUS

## 2021-09-10 MED ORDER — ORAL CARE MOUTH RINSE: 15.0000 mL | Freq: Once | OROMUCOSAL | Status: AC

## 2021-09-10 MED ORDER — LIDOCAINE 2% (20 MG/ML) 5 ML SYRINGE
INTRAMUSCULAR | Status: AC
  Filled 2021-09-10: qty 5

## 2021-09-10 MED ORDER — CEFAZOLIN IN SODIUM CHLORIDE 3-0.9 GM/100ML-% IV SOLN
3.0000 g | INTRAVENOUS | Status: AC
  Administered 2021-09-10: 3 g via INTRAVENOUS
  Filled 2021-09-10: qty 100

## 2021-09-10 MED ORDER — PROPOFOL 500 MG/50ML IV EMUL
INTRAVENOUS | Status: DC | PRN
  Administered 2021-09-10: 75 ug/kg/min via INTRAVENOUS

## 2021-09-10 MED ORDER — LACTATED RINGERS IV SOLN: INTRAVENOUS | Status: DC | PRN

## 2021-09-10 MED ORDER — METHOCARBAMOL 500 MG PO TABS: 500.0000 mg | ORAL_TABLET | Freq: Four times a day (QID) | ORAL | Status: DC | PRN

## 2021-09-10 MED ORDER — THROMBIN 20000 UNITS EX SOLR
CUTANEOUS | Status: DC | PRN
  Administered 2021-09-10: 20000 [IU] via TOPICAL

## 2021-09-10 MED ORDER — ONDANSETRON HCL 4 MG PO TABS: 4.0000 mg | ORAL_TABLET | Freq: Four times a day (QID) | ORAL | Status: DC | PRN

## 2021-09-10 SURGICAL SUPPLY — 87 items
BAG COUNTER SPONGE SURGICOUNT (BAG) ×6 IMPLANT
BENZOIN TINCTURE PRP APPL 2/3 (GAUZE/BANDAGES/DRESSINGS) ×6 IMPLANT
BLADE CLIPPER SURG (BLADE) IMPLANT
BLADE SURG 10 STRL SS (BLADE) ×3 IMPLANT
BONE VIVIGEN FORMABLE 10CC (Bone Implant) ×3 IMPLANT
BUR PRESCISION 1.7 ELITE (BURR) ×3 IMPLANT
BUR ROUND FLUTED 5 RND (BURR) ×3 IMPLANT
BUR ROUND PRECISION 4.0 (BURR) ×3 IMPLANT
CAGE COROENT XL 14X18X60 10D (Cage) ×3 IMPLANT
CNTNR URN SCR LID CUP LEK RST (MISCELLANEOUS) ×2 IMPLANT
CONT SPEC 4OZ STRL OR WHT (MISCELLANEOUS) ×1
COVER BACK TABLE 80X110 HD (DRAPES) ×3 IMPLANT
COVER MAYO STAND STRL (DRAPES) ×6 IMPLANT
COVER SURGICAL LIGHT HANDLE (MISCELLANEOUS) ×6 IMPLANT
DRAPE C-ARM 42X72 X-RAY (DRAPES) ×6 IMPLANT
DRAPE C-ARMOR (DRAPES) ×3 IMPLANT
DRAPE POUCH INSTRU U-SHP 10X18 (DRAPES) ×6 IMPLANT
DRAPE U-SHAPE 47X51 STRL (DRAPES) ×12 IMPLANT
DURAPREP 26ML APPLICATOR (WOUND CARE) ×6 IMPLANT
ELECT BLADE 4.0 EZ CLEAN MEGAD (MISCELLANEOUS) ×6
ELECT BLADE 6.5 EXT (BLADE) ×3 IMPLANT
ELECT CAUTERY BLADE 6.4 (BLADE) ×6 IMPLANT
ELECT REM PT RETURN 9FT ADLT (ELECTROSURGICAL) ×6
ELECTRODE BLDE 4.0 EZ CLN MEGD (MISCELLANEOUS) ×4 IMPLANT
ELECTRODE REM PT RTRN 9FT ADLT (ELECTROSURGICAL) ×4 IMPLANT
FILTER STRAW FLUID ASPIR (MISCELLANEOUS) ×3 IMPLANT
GAUZE 4X4 16PLY ~~LOC~~+RFID DBL (SPONGE) ×6 IMPLANT
GAUZE SPONGE 4X4 12PLY STRL (GAUZE/BANDAGES/DRESSINGS) ×6 IMPLANT
GLOVE SRG 8 PF TXTR STRL LF DI (GLOVE) ×4 IMPLANT
GLOVE SURG ENC MOIS LTX SZ6.5 (GLOVE) ×9 IMPLANT
GLOVE SURG ENC MOIS LTX SZ8 (GLOVE) ×6 IMPLANT
GLOVE SURG UNDER POLY LF SZ7 (GLOVE) ×6 IMPLANT
GLOVE SURG UNDER POLY LF SZ8 (GLOVE) ×2
GOWN STRL REUS W/ TWL LRG LVL3 (GOWN DISPOSABLE) ×6 IMPLANT
GOWN STRL REUS W/ TWL XL LVL3 (GOWN DISPOSABLE) ×6 IMPLANT
GOWN STRL REUS W/TWL LRG LVL3 (GOWN DISPOSABLE) ×3
GOWN STRL REUS W/TWL XL LVL3 (GOWN DISPOSABLE) ×3
GUIDEWIRE VIPER BT 1.37 (WIRE) ×6 IMPLANT
IV CATH 14GX2 1/4 (CATHETERS) ×6 IMPLANT
KIT BASIN OR (CUSTOM PROCEDURE TRAY) ×6 IMPLANT
KIT DILATOR XLIF 5 (KITS) ×2 IMPLANT
KIT POSITION SURG JACKSON T1 (MISCELLANEOUS) ×3 IMPLANT
KIT SURGICAL ACCESS MAXCESS 4 (KITS) ×3 IMPLANT
KIT TURNOVER KIT B (KITS) ×6 IMPLANT
KIT XLIF (KITS) ×1
MARKER SKIN DUAL TIP RULER LAB (MISCELLANEOUS) ×9 IMPLANT
MODULE EMG NEEDLE SSEP NVM5 (NEEDLE) ×3 IMPLANT
MODULE NVM5 NEXT GEN EMG (NEEDLE) ×6 IMPLANT
NEEDLE 18GX1X1/2 (RX/OR ONLY) (NEEDLE) ×3 IMPLANT
NEEDLE 22X1 1/2 (OR ONLY) (NEEDLE) ×6 IMPLANT
NEEDLE HYPO 25GX1X1/2 BEV (NEEDLE) ×6 IMPLANT
NEEDLE SPNL 18GX3.5 QUINCKE PK (NEEDLE) ×9 IMPLANT
NS IRRIG 1000ML POUR BTL (IV SOLUTION) ×9 IMPLANT
PACK LAMINECTOMY ORTHO (CUSTOM PROCEDURE TRAY) ×6 IMPLANT
PACK UNIVERSAL I (CUSTOM PROCEDURE TRAY) ×6 IMPLANT
PAD ARMBOARD 7.5X6 YLW CONV (MISCELLANEOUS) ×12 IMPLANT
PATTIES SURGICAL .5 X1 (DISPOSABLE) ×3 IMPLANT
PATTIES SURGICAL .5X1.5 (GAUZE/BANDAGES/DRESSINGS) ×3 IMPLANT
PENCIL BUTTON HOLSTER BLD 10FT (ELECTRODE) ×9 IMPLANT
PROBE BALL TIP NVM5 SNG USE (BALLOONS) ×3 IMPLANT
ROD LORDOSED 75MM VIPER 2 (Rod) ×3 IMPLANT
ROD VIPER 2 PRE-LORDOSED 80MM (Rod) ×3 IMPLANT
SCREW SET SINGLE INNER MIS (Screw) ×18 IMPLANT
SCREW XTAB POLY VIPER  7X45 (Screw) ×2 IMPLANT
SCREW XTAB POLY VIPER 7X45 (Screw) ×4 IMPLANT
SPONGE INTESTINAL PEANUT (DISPOSABLE) ×3 IMPLANT
SPONGE SURGIFOAM ABS GEL 100 (HEMOSTASIS) ×3 IMPLANT
SPONGE T-LAP 4X18 ~~LOC~~+RFID (SPONGE) ×6 IMPLANT
STAPLER VISISTAT 35W (STAPLE) ×3 IMPLANT
STRIP CLOSURE SKIN 1/2X4 (GAUZE/BANDAGES/DRESSINGS) ×6 IMPLANT
SUT MNCRL AB 4-0 PS2 18 (SUTURE) ×9 IMPLANT
SUT VIC AB 0 CT1 18XCR BRD 8 (SUTURE) ×2 IMPLANT
SUT VIC AB 0 CT1 8-18 (SUTURE) ×1
SUT VIC AB 1 CT1 18XCR BRD 8 (SUTURE) ×6 IMPLANT
SUT VIC AB 1 CT1 8-18 (SUTURE) ×3
SUT VIC AB 2-0 CT2 18 VCP726D (SUTURE) ×6 IMPLANT
SYR 20ML LL LF (SYRINGE) ×6 IMPLANT
SYR BULB IRRIG 60ML STRL (SYRINGE) ×6 IMPLANT
SYR CONTROL 10ML LL (SYRINGE) ×6 IMPLANT
SYR TB 1ML LUER SLIP (SYRINGE) ×3 IMPLANT
TAP CANN VIPER2 DL 6.0 (TAP) ×6 IMPLANT
TAP CANN VIPER2 DL 7.0 (TAP) ×6 IMPLANT
TOWEL GREEN STERILE (TOWEL DISPOSABLE) ×6 IMPLANT
TOWEL GREEN STERILE FF (TOWEL DISPOSABLE) ×3 IMPLANT
TRAY FOLEY MTR SLVR 16FR STAT (SET/KITS/TRAYS/PACK) ×3 IMPLANT
WATER STERILE IRR 1000ML POUR (IV SOLUTION) ×6 IMPLANT
YANKAUER SUCT BULB TIP NO VENT (SUCTIONS) ×6 IMPLANT

## 2021-09-10 NOTE — Anesthesia Postprocedure Evaluation (Signed)
Anesthesia Post Note  Patient: Luke Larsen  Procedure(s) Performed: RIGHT LATERAL LUMBAR FUSION LUMBAR 3- LUMBAR 4 WITH INSTRUMENTATION AND ALLOGRAFT (Right: Back) POSTERIOR SPINAL FUSION LUMBAR 3- LUMBAR 4  WITH INSTRUMENTATION AND ALOGRAFT (Back)     Patient location during evaluation: PACU Anesthesia Type: General Level of consciousness: sedated Pain management: pain level controlled Vital Signs Assessment: post-procedure vital signs reviewed and stable Respiratory status: spontaneous breathing and respiratory function stable Cardiovascular status: stable Postop Assessment: no apparent nausea or vomiting Anesthetic complications: no   No notable events documented.  Last Vitals:  Vitals:   09/10/21 1330 09/10/21 1345  BP: (!) 144/85 134/80  Pulse: 79 79  Resp: 12 11  Temp:    SpO2: 100% 100%    Last Pain:  Vitals:   09/10/21 1315  TempSrc:   PainSc: 8                  Jacci Ruberg DANIEL

## 2021-09-10 NOTE — Evaluation (Addendum)
Occupational Therapy Evaluation Patient Details Name: Luke Larsen MRN: [id number] DOB: 05-07-1956 Today's Date: 09/10/2021   History of Present Illness 65 yo M post op PLIF L3/4.  PMH includes:Back pain, Constipation, Diabetes mellitus, Dyspnea, Gout, Hip pain, History of kidney stones, Hypercholesterolemia, Hypertension.   Clinical Impression   Patient admitted for the diagnosis and procedure above.  He had a previous L2/3 fusion, and has all needed DME and adaptive equipment at home.  In addition, his spouse is available to assist as needed.  Patient understands and demonstrated all back precautions during bed mobility, seated ADL and toileting.  He is not needing any AD for ambulation, he had no issues with stairs, and was able to dress himself.  Patient stating if his incision is stable, and he has "appropriate" pain meds, he would prefer to go home today.  OT advised PT there are no acute or post acute PT needs.  All questions answered, and no acute OT needs identified.  The patient needs a sock aide, and will order from [employer].  Advised patient to speak with Dr Lynann Bologna regarding potential discharge planning.       Recommendations for follow up therapy are one component of a multi-disciplinary discharge planning process, led by the attending physician.  Recommendations may be updated based on patient status, additional functional criteria and insurance authorization.   Follow Up Recommendations  No OT follow up    Assistance Recommended at Discharge PRN  Functional Status Assessment  Patient has not had a recent decline in their functional status  Equipment Recommendations  Other (comment) (large sock aide)    Recommendations for Other Services       Precautions / Restrictions Precautions Precautions: Back Precaution Booklet Issued: Yes (comment) Precaution Comments: patient very familiar with all precautions. Required Braces or Orthoses: Spinal Brace Spinal Brace:  Thoracolumbosacral orthotic Restrictions Weight Bearing Restrictions: No      Mobility Bed Mobility Overal bed mobility: Modified Independent                  Transfers Overall transfer level: Modified independent                 General transfer comment: pushing IV pole      Balance Overall balance assessment: Mild deficits observed, not formally tested                                         ADL either performed or assessed with clinical judgement   ADL Overall ADL's : At baseline                                       General ADL Comments: patient has all DME and hip kit needed to perform ADL     Vision Patient Visual Report: No change from baseline       Perception Perception Perception: Not tested   Praxis Praxis Praxis: Not tested    Pertinent Vitals/Pain Pain Assessment: Faces Faces Pain Scale: Hurts a little bit Pain Location: Incisional Pain Descriptors / Indicators: Aching;Tender Pain Intervention(s): Monitored during session     Hand Dominance Right   Extremity/Trunk Assessment Upper Extremity Assessment Upper Extremity Assessment: Overall WFL for tasks assessed   Lower Extremity Assessment Lower Extremity Assessment: Overall WFL for tasks assessed   Cervical /  Trunk Assessment Cervical / Trunk Assessment: Back Surgery   Communication Communication Communication: No difficulties   Cognition Arousal/Alertness: Awake/alert Behavior During Therapy: WFL for tasks assessed/performed Overall Cognitive Status: Within Functional Limits for tasks assessed                                       General Comments   VSS on RA    Exercises     Shoulder Instructions      Home Living Family/patient expects to be discharged to:: Private residence Living Arrangements: Spouse/significant other;Children;Other relatives Available Help at Discharge: Family;Available 24 hours/day Type of  Home: House Home Access: Stairs to enter [employer] of Steps: 3 at the home.  25 stairs to the house up a hill. Entrance Stairs-Rails: Right Home Layout: One level     Bathroom Shower/Tub: Occupational psychologist: Standard     Home Equipment: Hand held shower head          Prior Functioning/Environment Prior Level of Function : Independent/Modified Independent                        OT Problem List: Pain      OT Treatment/Interventions:      OT Goals(Current goals can be found in the care plan section) Acute Rehab OT Goals Patient Stated Goal: Return home when medically cleared OT Goal Formulation: With patient Time For Goal Achievement: 09/17/21 Potential to Achieve Goals: Good  OT Frequency:     Barriers to D/C:  None noted          Co-evaluation              AM-PAC OT "6 Clicks" Daily Activity     Outcome Measure Help from another person eating meals?: None Help from another person taking care of personal grooming?: None Help from another person toileting, which includes using toliet, bedpan, or urinal?: None Help from another person bathing (including washing, rinsing, drying)?: None Help from another person to put on and taking off regular upper body clothing?: None Help from another person to put on and taking off regular lower body clothing?: A Little 6 Click Score: 23   End of Session Equipment Utilized During Treatment: Back brace  Activity Tolerance: Patient tolerated treatment well Patient left: in bed;with call bell/phone within reach;with family/visitor present  OT Visit Diagnosis: Pain Pain - Right/Left:  (back)                Time: 1638-4665 OT Time Calculation (min): 18 min Charges:  OT General Charges $OT Visit: 1 Visit OT Evaluation $OT Eval Moderate Complexity: 1 Mod  09/10/2021  RP, OTR/L  Acute Rehabilitation Services  Office:  [phone]   [name] 09/10/2021, 4:25 PM

## 2021-09-10 NOTE — Transfer of Care (Signed)
Immediate Anesthesia Transfer of Care Note  Patient: Luke Larsen  Procedure(s) Performed: RIGHT LATERAL LUMBAR FUSION LUMBAR 3- LUMBAR 4 WITH INSTRUMENTATION AND ALLOGRAFT (Right: Back) POSTERIOR SPINAL FUSION LUMBAR 3- LUMBAR 4  WITH INSTRUMENTATION AND ALOGRAFT (Back)  Patient Location: PACU  Anesthesia Type:General  Level of Consciousness: awake and oriented  Airway & Oxygen Therapy: Patient Spontanous Breathing and Patient connected to nasal cannula oxygen  Post-op Assessment: Report given to RN and Patient moving all extremities X 4  Post vital signs: Reviewed and stable  Last Vitals:  Vitals Value Taken Time  BP 130/62 09/10/21 1243  Temp    Pulse 86 09/10/21 1245  Resp 17 09/10/21 1245  SpO2 99 % 09/10/21 1245  Vitals shown include unvalidated device data.  Last Pain:  Vitals:   09/10/21 0614  TempSrc:   PainSc: 0-No pain         Complications: No notable events documented.

## 2021-09-10 NOTE — Anesthesia Procedure Notes (Signed)
Arterial Line Insertion Start/End11/23/2022 6:50 AM, 09/10/2021 7:10 AM Performed by: Josephine Igo, CRNA, CRNA  Patient location: Pre-op. Preanesthetic checklist: patient identified, IV checked, site marked, risks and benefits discussed and surgical consent Lidocaine 1% used for infiltration and patient sedated Left, radial was placed Catheter size: 20 G Hand hygiene performed  and maximum sterile barriers used   Attempts: 1 Procedure performed without using ultrasound guided technique. Following insertion, dressing applied and Biopatch. Post procedure assessment: normal  Patient tolerated the procedure well with no immediate complications.

## 2021-09-10 NOTE — Progress Notes (Signed)
PT Cancellation Note  Patient Details Name: Luke Larsen MRN: [id number] DOB: [birth year]   Cancelled Treatment:    Reason Eval/Treat Not Completed: PT screened, no needs identified, will sign off.  Per OT, no further therapy needs. 09/10/2021  Ginger Carne., PT Acute Rehabilitation Services [phone]  (pager) [phone]  (office)   Tessie Fass Peirce Deveney 09/10/2021, 4:53 PM

## 2021-09-10 NOTE — Op Note (Signed)
PATIENT NAME: Luke Larsen    MEDICAL RECORD NO.:   [id number]    DATE OF BIRTH: [birth year]   DATE OF PROCEDURE: 09/10/2021                               OPERATIVE REPORT   PREOPERATIVE DIAGNOSES: 1.  Bilateral lumbar radiculopathy. 2.  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  S/p previous L2/3 fusion   POSTOPERATIVE DIAGNOSES: 1.  Bilateral lumbar radiculopathy. 2.  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  S/p previous L2/3 fusion   PROCEDURE:  1.  Right-sided lateral interbody fusion, L3/4  via direct lateral retroperitoneal approach. 2.  Insertion of interbody device x1 (14 x 18 mm x 60 mm NuVasive intervertebral spacer). 3.  Use of morselized allograft -- ViviGen.   4.  Intraoperative use of fluoroscopy. 5.  Posterior spinal fusion, L3/4 6.  Placement of posterior instrumentation, L3 bilaterally (connected to previous L2/3 construct) 7.  Posterior lumbar decompression, L3-4   SURGEON:  Phylliss Bob, MD   ASSISTANT:  Pricilla Holm PA-C.   ANESTHESIA:  General endotracheal anesthesia.   COMPLICATIONS:  None.   DISPOSITION:  Stable.   ESTIMATED BLOOD LOSS:  Minimal.   INDICATIONS:  Briefly, Luke Larsen is a very pleasant 65 year old male who did present to me with ongoing pain in the right leg.  He was noted to have right-sided lumbar radiculopathy, which did progress to left-sided radiculopathy as well.  He is status post an L3-4 lateral/posterior fusion, which he did well from.  More recent updated MRI did reveal severe stenosis at the level below this, at L3-4. Given his ongoing pain and dysfunction, we did discuss proceeding with the procedure reflected above.  The patient did wish to proceed, after a full understanding of the risks and benefits of surgery.   DESCRIPTION OF PROCEDURE:  On 09/10/2021 the patient was brought to surgery and general endotracheal anesthesia was administered.  The patient was placed in the lateral decubitus position, with the left side up. Neurologic monitoring leads were placed by the  monitoring technician.  The patient's torso and lower extremities were secured to the bed.  The patient's hips and knees were flexed in order to lessen the tension on the psoas musculature.  The left flank was then prepped and draped in the usual sterile fashion.  The bed was flexed, in order to optimize exposure to the L3/4 intervertebral space.  After a timeout procedure was performed, a right-sided transverse incision was made over the right flank overlying the L3/4 intervertebral space.  The retroperitoneal space was encountered, after dissection through the oblique musculature.  The peritoneum was bluntly swept anteriorly, and the psoas was readily identified.  I did use a series of dilators to dock over the L3/4 intervertebral space.  I did use neurologic monitoring while placing the dilators, in order to ensure that there were no neurologic structures in the immediate vicinity of the dilators.  The lumbar plexus was noted to be posterior.  A self-retaining retractor was placed, and was attached to a rigid arm.  The retractor was very gently dilated and a shim was placed into the L3/4 intervertebral space.  I then used a knife to perform an annulotomy at the lateral aspect of the L3/4 intervertebral space.  I then used a series of curettes and pituitary rongeurs in order to perform a thorough and complete L3/4 intervertebral diskectomy.  The contralateral annulus was released.  I then placed a series of  intervertebral spacer trials, and I did feel that a 14 x 18 mm x 60 mm spacer would be the most appropriate fit.  The appropriate spacer was then packed with ViviGen and tamped into position.  I was very pleased with the final resting position of the intervertebral spacer. Excellent height restoration was noted. I was very pleased with the final AP and lateral fluoroscopic images and the excellent restoration of disk height identified on both AP and lateral images.  At this point, the wound was copiously  irrigated.  The fascia, internal, and external oblique musculature was closed using #1 Vicryl.  The subcutaneous layer was closed using 2-0 Vicryl and the skin was closed using 4-0 Monocryl.    A sterile dressing was then applied over the right flank incision.  The patient was then placed prone on a well-padded flat Jackson bed with a spinal frame.  The back was prepped and draped in the usual sterile fashion.  At this point, bilateral paramedian incisions were made overlying the L2-3 and L3-4 levels.  The L3-4 level was identified bilaterally, and high-speed bur was used to perform a partial facetectomy on the right and left sides, thereby decompressing the right and left lateral recess, as there was rather severe spinal stenosis noted preoperatively.  On the left, the L3-4 facet joint was decorticated, and the remainder of the Vivigen was packed along the left facet joint, to aid in the success of the fusion.  At this point, the caps overlying the L2 and L3 pedicles bilaterally were removed, as were the interconnecting rods. Jamshidi's were then advanced across the L4 pedicles bilaterally.  Guidewires were placed, and I did use a 6 mm tap over the guidewires, through the bilateral L4 pedicles. Triggered EMG was used to test the taps to ensure that they were not in the vicinity of any neurologic structures.  At this point, pedicle screws were placed bilaterally into the L4 pedicles, 7 mm in diameter, and the guidewires were removed. 85 mm rods were secured into the tulip heads of the screws, connecting the L2, L3, and L4 levels bilaterally. Caps were placed over each of the screws, and a final locking procedure was performed.  The wound was copiously irrigated.  I was very pleased with the AP and lateral fluoroscopic images.  The wound was then closed using #1 Vicryl followed by 2-0 Vicryl followed by 4-0 Monocryl.  Benzoin and Steri-Strips were applied over the left lateral wound and left posterior wound,  followed by sterile dressing.     Of note, I did use neurologic monitoring throughout the entire surgery, and there was no sustained EMG activity noted throughout the entire surgery. All instrument counts were correct at the termination of the procedure.   Of note, Pricilla Holm was my assistant throughout surgery, and did aid in retraction, placement of the hardware, suctioning, and closure.   Phylliss Bob, MD

## 2021-09-10 NOTE — Anesthesia Procedure Notes (Signed)
Procedure Name: Intubation Date/Time: 09/10/2021 7:57 AM Performed by: Terrence Dupont, CRNA Pre-anesthesia Checklist: Patient identified, Emergency Drugs available, Suction available and Patient being monitored Patient Re-evaluated:Patient Re-evaluated prior to induction Oxygen Delivery Method: Circle system utilized Preoxygenation: Pre-oxygenation with 100% oxygen Induction Type: IV induction Ventilation: Mask ventilation without difficulty Laryngoscope Size: Mac and 4 Grade View: Grade II Tube type: Oral Tube size: 7.5 mm Number of attempts: 1 Airway Equipment and Method: Stylet and Oral airway Placement Confirmation: ETT inserted through vocal cords under direct vision, positive ETCO2 and breath sounds checked- equal and bilateral Secured at: 22 cm Tube secured with: Tape Dental Injury: Teeth and Oropharynx as per pre-operative assessment

## 2021-09-10 NOTE — Progress Notes (Signed)
Patient awaiting transport to his vehicle via wheelchair by NT for discharge home; in no acute distress nor complaints of pain nor discomfort; incision on his back and right flank with gauze dressing and is clean, dry and intact with back brace on; room was checked and accounted for all his belongings; discharge instructions concerning his medications, incision care, follow up appointment and when to call the doctor as needed were all discussed with patient and wife by RN and both verbalized understanding on the instructions given.

## 2021-09-10 NOTE — H&P (Signed)
PREOPERATIVE H&P  Chief Complaint: Right leg pain and numbness  HPI: Luke Larsen is a 65 y.o. male who presents with ongoing pain in the right leg  MRI reveals severe spinal stenosis, L3/4  Patient has failed multiple forms of conservative care and continues to have pain (see office notes for additional details regarding the patient's full course of treatment)  Past Medical History:  Diagnosis Date   Arthritis    Back pain    Constipation    Diabetes mellitus without complication (HCC)    Dyspnea    Gout    Hip pain    History of kidney stones    Hypercholesterolemia    Hypertension    Joint pain    Lower extremity edema    Neck pain    Shoulder pain    Sleep apnea    Vitamin D deficiency    Past Surgical History:  Procedure Laterality Date   ANTERIOR LAT LUMBAR FUSION Left 12/05/2020   Procedure: LEFT LATERAL INTERBODY FUSION LUMBAR 2 - LUMBAR 3 WITH INSTRUMENTATION AND ALLOGRAFT;  Surgeon: Phylliss Bob, MD;  Location: [facility];  Service: Orthopedics;  Laterality: Left;   BACK SURGERY     04/2019   KIDNEY STONE SURGERY  03/28/2012   Social History   Socioeconomic History   Marital status: Married    Spouse name: Luke Larsen   Number of children: 2   Years of education: B.A.S   Highest education level: Not on file  Occupational History   Occupation: Tree surgeon  Tobacco Use   Smoking status: Never   Smokeless tobacco: Never  Vaping Use   Vaping Use: Never used  Substance and Sexual Activity   Alcohol use: No    Comment: Quit drinking alcohol 84yr ago;very little   Drug use: No   Sexual activity: Not on file  Other Topics Concern   Not on file  Social History Narrative   Pt lives at home with his spouse and daughter.   Caffeine Use- Quit 01/04/13   Social Determinants of Health   Financial Resource Strain: Not on file  Food Insecurity: Not on file  Transportation Needs: Not on file  Physical Activity: Not on file  Stress: Not on file   Social Connections: Not on file   Family History  Problem Relation Age of Onset   Cancer Mother    Diabetes Father    Hypertension Father    Hyperlipidemia Father    Obesity Father    Allergies  Allergen Reactions   Pravastatin Sodium     Myalgias   Qsymia [Phentermine-Topiramate]     tired, paresthesias   Cymbalta [Duloxetine Hcl] Other (See Comments)    "It made me feel really weird, awful."   Prior to Admission medications   Medication Sig Start Date End Date Taking? Authorizing Provider  Eszopiclone 3 MG TABS Take 3 mg by mouth at bedtime. 11/24/20  Yes [provider]  Ibuprofen-Acetaminophen (ADVIL DUAL ACTION) 125-250 MG TABS Take 2 tablets by mouth 2 (two) times daily as needed (mild pain).   Yes [provider]  Multiple Vitamin (MULTIVITAMIN WITH MINERALS) TABS tablet Take 1 tablet by mouth daily.   Yes [provider]  naloxone (NARCAN) nasal spray 4 mg/0.1 mL Place 1 spray into the nose as needed. overdose 07/17/21  Yes [provider]  Oxycodone HCl 10 MG TABS Take 10 mg by mouth every 4 (four) hours as needed for severe pain. 08/14/21  Yes [provider]  polyethylene glycol (MIRALAX / GLYCOLAX) 17 g packet Take 17 g by mouth daily as needed for moderate constipation.   Yes [provider]  pregabalin (LYRICA) 100 MG capsule Take 100 mg by mouth 2 (two) times daily. 08/14/21  Yes [provider]  tiZANidine (ZANAFLEX) 4 MG tablet Take 2 mg by mouth every 8 (eight) hours as needed for muscle spasms.   Yes [provider]  ACCU-CHEK GUIDE test strip USE AS DIRECTED UP TO 4 TIMES A DAY 01/01/20   Abby Potash, PA-C  blood glucose meter kit and supplies KIT Dispense based on patient and insurance preference. Use up to four times daily as directed. (FOR ICD-9 250.00, 250.01). 12/12/19   Dennard Nip D, MD     All other systems have been reviewed and were otherwise negative with the exception of those  mentioned in the HPI and as above.  Physical Exam: Vitals:   09/10/21 0605  BP: (!) 160/80  Pulse: 73  Resp: 19  Temp: 97.8 F (36.6 C)  SpO2: 96%    Body mass index is 47.5 kg/m.  General: Alert, no acute distress Cardiovascular: No pedal edema Respiratory: No cyanosis, no use of accessory musculature Skin: No lesions in the area of chief complaint Neurologic: Sensation intact distally Psychiatric: Patient is competent for consent with normal mood and affect Lymphatic: No axillary or cervical lymphadenopathy   Assessment/Plan: SEVERE SPINAL STENOSIS, L3/4 Plan for Procedure(s): RIGHT LATERAL LUMBAR FUSION LUMBAR 3- LUMBAR 4 WITH INSTRUMENTATION AND ALLOGRAFT POSTERIOR SPINAL FUSION LUMBAR 3- LUMBAR 4  WITH INSTRUMENTATION AND ALOGRAFT   Norva Karvonen, MD 09/10/2021 6:47 AM

## 2021-09-11 MED FILL — Thrombin (Recombinant) For Soln 20000 Unit: CUTANEOUS | Qty: 1 | Status: AC

## 2021-09-15 ENCOUNTER — Encounter (HOSPITAL_COMMUNITY): Payer: Self-pay | Admitting: Orthopedic Surgery

## 2021-09-16 ENCOUNTER — Encounter (HOSPITAL_COMMUNITY): Payer: Self-pay | Admitting: Orthopedic Surgery

## 2021-09-18 NOTE — Discharge Summary (Signed)
Patient ID: Luke Larsen Treat MRN: [id number] DOB/AGE: 1956-09-05 65 y.o.  Admit date: 09/10/2021 Discharge date: 09/10/2021  Admission Diagnoses:  Principal Problem:   Spinal stenosis   Discharge Diagnoses:  Same  Past Medical History:  Diagnosis Date   Arthritis    Back pain    Constipation    Diabetes mellitus without complication ([place])    Dyspnea    Gout    Hip pain    History of kidney stones    Hypercholesterolemia    Hypertension    Joint pain    Lower extremity edema    Neck pain    Shoulder pain    Sleep apnea    Vitamin D deficiency     Surgeries: Procedure(s): RIGHT LATERAL LUMBAR FUSION LUMBAR 3- LUMBAR 4 WITH INSTRUMENTATION AND ALLOGRAFT POSTERIOR SPINAL FUSION LUMBAR 3- LUMBAR 4  WITH INSTRUMENTATION AND ALOGRAFT on 09/10/2021   Consultants: None  Discharged Condition: Improved  Hospital Course: Mukesh Kornegay is an 64 y.o. male who was admitted 09/10/2021 for operative treatment of Spinal stenosis. Patient has severe unremitting pain that affects sleep, daily activities, and work/hobbies. After pre-op clearance the patient was taken to the operating room on 09/10/2021 and underwent  Procedure(s): RIGHT LATERAL LUMBAR FUSION LUMBAR 3- LUMBAR 4 WITH INSTRUMENTATION AND ALLOGRAFT POSTERIOR SPINAL FUSION LUMBAR 3- LUMBAR 4  WITH INSTRUMENTATION AND ALOGRAFT.    Patient was given perioperative antibiotics:  Anti-infectives (From admission, onward)    Start     Dose/Rate Route Frequency Ordered Stop   09/10/21 1600  ceFAZolin (ANCEF) IVPB 2g/100 mL premix  Status:  Discontinued        2 g 200 mL/hr over 30 Minutes Intravenous Every 8 hours 09/10/21 1439 09/10/21 2342   09/10/21 0600  ceFAZolin (ANCEF) IVPB 2g/100 mL premix  Status:  Discontinued        2 g 200 mL/hr over 30 Minutes Intravenous On call to O.R. 09/10/21 0553 09/10/21 0558   09/10/21 0600  ceFAZolin (ANCEF) IVPB 3g/100 mL premix        3 g 200 mL/hr over 30 Minutes Intravenous On call  to O.R. 09/10/21 4098 09/10/21 0755        Patient was given sequential compression devices, early ambulation to prevent DVT.  Patient benefited maximally from hospital stay and there were no complications.    Recent vital signs: BP (!) 145/75 (BP Location: Right Arm)   Pulse 69   Temp 97.7 F (36.5 C) (Oral)   Resp 20   Ht _0  (1.854 m)   Wt (!) 163.3 kg   SpO2 95%   BMI 47.50 kg/m    Discharge Medications:   Allergies as of 09/10/2021       Reactions   Pravastatin Sodium    Myalgias   Qsymia [phentermine-topiramate]    tired, paresthesias   Cymbalta [duloxetine Hcl] Other (See Comments)   "It made me feel really weird, awful."        Medication List     STOP taking these medications    tiZANidine 4 MG tablet Commonly known as: ZANAFLEX       TAKE these medications    Accu-Chek Guide test strip Generic drug: glucose blood USE AS DIRECTED UP TO 4 TIMES A DAY   blood glucose meter kit and supplies Kit Dispense based on patient and insurance preference. Use up to four times daily as directed. (FOR ICD-9 250.00, 250.01).   Eszopiclone 3 MG Tabs Take 3 mg by mouth  at bedtime.   methocarbamol 500 MG tablet Commonly known as: ROBAXIN Take 1-2 tablets (500-1,000 mg total) by mouth every 6 (six) hours as needed for muscle spasms.   multivitamin with minerals Tabs tablet Take 1 tablet by mouth daily.   naloxone 4 MG/0.1ML Liqd nasal spray kit Commonly known as: NARCAN Place 1 spray into the nose as needed. overdose   Oxycodone HCl 10 MG Tabs Take 10 mg by mouth every 4 (four) hours as needed for severe pain.   polyethylene glycol 17 g packet Commonly known as: MIRALAX / GLYCOLAX Take 17 g by mouth daily as needed for moderate constipation.   pregabalin 100 MG capsule Commonly known as: LYRICA Take 100 mg by mouth 2 (two) times daily.       ASK your doctor about these medications    oxyCODONE 10 mg 12 hr tablet Commonly known as:  OXYCONTIN Take 1 tablet (10 mg total) by mouth every 12 (twelve) hours for 7 days. Ask about: Should I take this medication?        Diagnostic Studies: DG Lumbar Spine 2-3 Views  Result Date: 09/10/2021 CLINICAL DATA:  L3-L4 XLIF with posterior fixation EXAM: LUMBAR SPINE - 2-3 VIEW COMPARISON:  Lumbar spine MRI 07/08/2021, lumbar spine radiographs 03/11/2021 FINDINGS: Three C-arm fluoroscopic images were obtained intraoperatively and submitted for post operative interpretation. Postsurgical changes reflecting posterior instrumented fusion at L2-L3 and L3-L4 are seen, new at L3-L4 since the prior radiographs. Interbody spacers are seen at both levels. Hardware alignment is within expected limits, without evidence of immediate complication. Fluoro time 4 minutes 36 seconds. Please see the performing provider's procedural report for further detail. IMPRESSION: Interval extension of the posterior fusion which now extends from L2-L3 to L3-L4. No evidence of immediate complication. Electronically Signed   By: Valetta Mole M.D.   On: 09/10/2021 13:46   DG C-Arm 1-60 Min-No Report  Result Date: 09/10/2021 Fluoroscopy was utilized by the requesting physician.  No radiographic interpretation.   DG C-Arm 1-60 Min-No Report  Result Date: 09/10/2021 Fluoroscopy was utilized by the requesting physician.  No radiographic interpretation.   DG C-Arm 1-60 Min-No Report  Result Date: 09/10/2021 Fluoroscopy was utilized by the requesting physician.  No radiographic interpretation.   DG C-Arm 1-60 Min-No Report  Result Date: 09/10/2021 Fluoroscopy was utilized by the requesting physician.  No radiographic interpretation.   DG FACET JT INJ L /S  2ND LEVEL LEFT W/FL/CT  Result Date: 08/27/2021 CLINICAL DATA:  Lumbosacral spondylosis without myelopathy. Lumbar facet arthropathy. Bilateral low back pain. Right leg pain. FLUOROSCOPY TIME:  Fluoroscopy Time: 1 minute 2 seconds Radiation Exposure Index:  215.33 microGray*m^2 PROCEDURE: The procedure, risks, benefits, and alternatives were explained to the patient. Questions regarding the procedure were encouraged and answered. The patient understands and consents to the procedure. BILATERAL L4-5 AND L5-S1 FACET INJECTIONS: A posterior oblique approach was taken to the facets on the right and the left at L4-5 and L5-S1 using curved 5 inch 22 gauge spinal needles. Joint access was limited due to advanced arthropathy including partial ankylosis of some of these joints. Injection of a small amount of Isovue-M 200 demonstrated intra-articular spread on the right at L5-S1 and predominantly juxta-articular spread at the other 3 joints. No vascular opacification was seen. A mixture of 120 mg of Depo-Medrol and 4.5 mL of 0.25% bupivacaine was injected, divided evenly between the 4 joints. The right-sided injections resulted in concordant pain. The procedure was well-tolerated. IMPRESSION: Technically successful bilateral L4-5 and  L5-S1 facet injections. Electronically Signed   By: Logan Bores M.D.   On: 08/27/2021 14:10   DG FACET JT INJ L /S 2ND LEVEL RIGHT W/FL/CT  Result Date: 08/27/2021 CLINICAL DATA:  Lumbosacral spondylosis without myelopathy. Lumbar facet arthropathy. Bilateral low back pain. Right leg pain. FLUOROSCOPY TIME:  Fluoroscopy Time: 1 minute 2 seconds Radiation Exposure Index: 215.33 microGray*m^2 PROCEDURE: The procedure, risks, benefits, and alternatives were explained to the patient. Questions regarding the procedure were encouraged and answered. The patient understands and consents to the procedure. BILATERAL L4-5 AND L5-S1 FACET INJECTIONS: A posterior oblique approach was taken to the facets on the right and the left at L4-5 and L5-S1 using curved 5 inch 22 gauge spinal needles. Joint access was limited due to advanced arthropathy including partial ankylosis of some of these joints. Injection of a small amount of Isovue-M 200 demonstrated  intra-articular spread on the right at L5-S1 and predominantly juxta-articular spread at the other 3 joints. No vascular opacification was seen. A mixture of 120 mg of Depo-Medrol and 4.5 mL of 0.25% bupivacaine was injected, divided evenly between the 4 joints. The right-sided injections resulted in concordant pain. The procedure was well-tolerated. IMPRESSION: Technically successful bilateral L4-5 and L5-S1 facet injections. Electronically Signed   By: Logan Bores M.D.   On: 08/27/2021 14:10   DG FACET JT INJ L /S SINGLE LEVEL LEFT W/FL/CT  Result Date: 08/27/2021 CLINICAL DATA:  Lumbosacral spondylosis without myelopathy. Lumbar facet arthropathy. Bilateral low back pain. Right leg pain. FLUOROSCOPY TIME:  Fluoroscopy Time: 1 minute 2 seconds Radiation Exposure Index: 215.33 microGray*m^2 PROCEDURE: The procedure, risks, benefits, and alternatives were explained to the patient. Questions regarding the procedure were encouraged and answered. The patient understands and consents to the procedure. BILATERAL L4-5 AND L5-S1 FACET INJECTIONS: A posterior oblique approach was taken to the facets on the right and the left at L4-5 and L5-S1 using curved 5 inch 22 gauge spinal needles. Joint access was limited due to advanced arthropathy including partial ankylosis of some of these joints. Injection of a small amount of Isovue-M 200 demonstrated intra-articular spread on the right at L5-S1 and predominantly juxta-articular spread at the other 3 joints. No vascular opacification was seen. A mixture of 120 mg of Depo-Medrol and 4.5 mL of 0.25% bupivacaine was injected, divided evenly between the 4 joints. The right-sided injections resulted in concordant pain. The procedure was well-tolerated. IMPRESSION: Technically successful bilateral L4-5 and L5-S1 facet injections. Electronically Signed   By: Logan Bores M.D.   On: 08/27/2021 14:10   DG FACET JT INJ L /S SINGLE LEVEL RIGHT W/FL/CT  Result Date:  08/27/2021 CLINICAL DATA:  Lumbosacral spondylosis without myelopathy. Lumbar facet arthropathy. Bilateral low back pain. Right leg pain. FLUOROSCOPY TIME:  Fluoroscopy Time: 1 minute 2 seconds Radiation Exposure Index: 215.33 microGray*m^2 PROCEDURE: The procedure, risks, benefits, and alternatives were explained to the patient. Questions regarding the procedure were encouraged and answered. The patient understands and consents to the procedure. BILATERAL L4-5 AND L5-S1 FACET INJECTIONS: A posterior oblique approach was taken to the facets on the right and the left at L4-5 and L5-S1 using curved 5 inch 22 gauge spinal needles. Joint access was limited due to advanced arthropathy including partial ankylosis of some of these joints. Injection of a small amount of Isovue-M 200 demonstrated intra-articular spread on the right at L5-S1 and predominantly juxta-articular spread at the other 3 joints. No vascular opacification was seen. A mixture of 120 mg of Depo-Medrol and 4.5  mL of 0.25% bupivacaine was injected, divided evenly between the 4 joints. The right-sided injections resulted in concordant pain. The procedure was well-tolerated. IMPRESSION: Technically successful bilateral L4-5 and L5-S1 facet injections. Electronically Signed   By: Logan Bores M.D.   On: 08/27/2021 14:10    Disposition: Discharge disposition: 01-Home or Self Care       Discharge Instructions     Discharge patient   Complete by: As directed    Discharge disposition: 01-Home or Self Care   Discharge patient date: 09/11/2021       -Scripts for pain sent to pharmacy electronically  -D/C instructions sheet printed and in chart -D/C today  -F/U in office 2 weeks   Signed: Lennie Muckle Robby Bulkley 09/18/2021, 1:04 PM

## 2021-10-07 ENCOUNTER — Other Ambulatory Visit (INDEPENDENT_AMBULATORY_CARE_PROVIDER_SITE_OTHER): Payer: Self-pay | Admitting: Family Medicine

## 2022-02-13 ENCOUNTER — Other Ambulatory Visit: Payer: Self-pay | Admitting: Family Medicine

## 2022-02-13 DIAGNOSIS — R7401 Elevation of levels of liver transaminase levels: Secondary | ICD-10-CM

## 2022-02-14 NOTE — Progress Notes (Signed)
?Cardiology Office Note:   ? ?Date:  02/17/2022  ? ?ID:  Luke Larsen, DOB 20-Sep-1956, MRN [id number] ? ?PCP:  Harlan Stains, MD ?  ?[facility] Providers ?Cardiologist:  None { ? ? ?Referring MD: Harlan Stains, MD  ? ? ?History of Present Illness:   ? ?Luke Larsen is a 67 y.o. male with a hx of DMII, HTN, HLD, and OSA who was referred by Dr. Dema Severin for further evaluation of dyspnea on exertion.  ? ?Today, the patient states that he feels very limited due to severe back pain. Since being less mobile, he finds that any activity he does makes him feel short of breath. No exertional chest pain, but did have an episode of chest pain while sitting in his chair at home that lasted about 61mn before resolving. No lightheadedness, dizziness, palpitations, or syncope. Has been having having trace LE edema for which he has been started on combination valsartan-HCTZ pill with improvement of symptoms.  ? ?States his blood pressure has been running 140. Does not have a reliable cuff at home.  ? ?Past Medical History:  ?Diagnosis Date  ? Arthritis   ? Back pain   ? Constipation   ? Diabetes mellitus without complication ([place]   ? Dyspnea   ? Gout   ? Hip pain   ? History of kidney stones   ? Hypercholesterolemia   ? Hypertension   ? Joint pain   ? Lower extremity edema   ? Neck pain   ? Shoulder pain   ? Sleep apnea   ? Vitamin D deficiency   ? ? ?Past Surgical History:  ?Procedure Laterality Date  ? ANTERIOR LAT LUMBAR FUSION Left 12/05/2020  ? Procedure: LEFT LATERAL INTERBODY FUSION LUMBAR 2 - LUMBAR 3 WITH INSTRUMENTATION AND ALLOGRAFT;  Surgeon: DPhylliss Bob MD;  [facility]  Service: Orthopedics;  Laterality: Left;  ? ANTERIOR LAT LUMBAR FUSION Right 09/10/2021  ? Procedure: RIGHT LATERAL LUMBAR FUSION LUMBAR 3- LUMBAR 4 WITH INSTRUMENTATION AND ALLOGRAFT;  Surgeon: DPhylliss Bob MD;  [facility]  Service: Orthopedics;  Laterality: Right;  ? BACK SURGERY    ? 04/2019  ? KIDNEY STONE SURGERY  03/28/2012   ? ? ?Current Medications: ?Current Meds  ?Medication Sig  ? ACCU-CHEK GUIDE test strip USE AS DIRECTED UP TO 4 TIMES A DAY  ? amLODipine (NORVASC) 5 MG tablet Take 1 tablet (5 mg total) by mouth daily.  ? blood glucose meter kit and supplies KIT Dispense based on patient and insurance preference. Use up to four times daily as directed. (FOR ICD-9 250.00, 250.01).  ? Eszopiclone 3 MG TABS Take 3 mg by mouth at bedtime.  ? methocarbamol (ROBAXIN) 500 MG tablet Take 1-2 tablets (500-1,000 mg total) by mouth every 6 (six) hours as needed for muscle spasms.  ? metoprolol tartrate (LOPRESSOR) 100 MG tablet Take 1 tablet (100 mg total) by mouth once for 1 dose. Take 90-120 minutes prior to scan.  ? Multiple Vitamin (MULTIVITAMIN WITH MINERALS) TABS tablet Take 1 tablet by mouth daily.  ? naloxone (NARCAN) nasal spray 4 mg/0.1 mL Place 1 spray into the nose as needed. overdose  ? Oxycodone HCl 10 MG TABS Take 10 mg by mouth every 4 (four) hours as needed for severe pain.  ? polyethylene glycol (MIRALAX / GLYCOLAX) 17 g packet Take 17 g by mouth daily as needed for moderate constipation.  ? valsartan-hydrochlorothiazide (DIOVAN-HCT) 320-25 MG tablet Take 1 tablet by mouth daily.  ?  ? ?  Allergies:   Pravastatin sodium, Qsymia [phentermine-topiramate], and Cymbalta [duloxetine hcl]  ? ?Social History  ? ?Socioeconomic History  ? Marital status: Married  ?  Spouse name: Rollan Roger  ? Number of children: 2  ? Years of education: B.A.S  ? Highest education level: Not on file  ?Occupational History  ? Occupation: Tree surgeon  ?Tobacco Use  ? Smoking status: Never  ? Smokeless tobacco: Never  ?Vaping Use  ? Vaping Use: Never used  ?Substance and Sexual Activity  ? Alcohol use: No  ?  Comment: Quit drinking alcohol 65yr ago;very little  ? Drug use: No  ? Sexual activity: Not on file  ?Other Topics Concern  ? Not on file  ?Social History Narrative  ? Pt lives at home with his spouse and daughter.  ? Caffeine Use- Quit 01/04/13   ? ?Social Determinants of Health  ? ?Financial Resource Strain: Not on file  ?Food Insecurity: Not on file  ?Transportation Needs: Not on file  ?Physical Activity: Not on file  ?Stress: Not on file  ?Social Connections: Not on file  ?  ? ?Family History: ?The patient's family history includes Cancer in his mother; Diabetes in his father; Hyperlipidemia in his father; Hypertension in his father; Obesity in his father. ? ?ROS:   ?Please see the history of present illness.    ?Review of Systems  ?Constitutional:  Positive for malaise/fatigue.  ?Respiratory:  Positive for shortness of breath.   ?Cardiovascular:  Positive for chest pain and leg swelling. Negative for palpitations, orthopnea, claudication and PND.  ?Gastrointestinal:  Negative for blood in stool.  ?Genitourinary:  Negative for flank pain.  ?Musculoskeletal:  Positive for back pain, joint pain and myalgias. Negative for falls.  ?Neurological:  Negative for dizziness and loss of consciousness.   ? ?EKGs/Labs/Other Studies Reviewed:   ? ?The following studies were reviewed today: ?No cardiac studies ? ?EKG:  EKG is  ordered today.  The ekg ordered today demonstrates NSR with HR 81 ? ?Recent Labs: ?09/03/2021: Hemoglobin 16.8; Platelets 208  ?Recent Lipid Panel ?   ?Component Value Date/Time  ? CHOL 193 07/16/2020 1210  ? TRIG 148 07/16/2020 1210  ? HDL 43 07/16/2020 1210  ? CHOLHDL 4.5 07/16/2020 1210  ? LDLCALC 124 (H) 07/16/2020 1210  ? ? ? ?Risk Assessment/Calculations:   ?  ? ?    ? ?Physical Exam:   ? ?VS:  BP (!) 144/88   Pulse 81   Ht '6\' 1"'  (1.854 m)   Wt (!) 362 lb 9.6 oz (164.5 kg)   SpO2 95%   BMI 47.84 kg/m?    ? ?Wt Readings from Last 3 Encounters:  ?02/17/22 (!) 362 lb 9.6 oz (164.5 kg)  ?09/10/21 (!) 360 lb (163.3 kg)  ?09/03/21 (!) 368 lb (166.9 kg)  ?  ? ?GEN:  Well nourished, well developed in no acute distress ?HEENT: Normal ?NECK: No JVD; No carotid bruits ?CARDIAC: RRR, no murmurs, rubs, gallops ?RESPIRATORY:  Clear to auscultation  without rales, wheezing or rhonchi  ?ABDOMEN: Soft, non-tender, non-distended ?MUSCULOSKELETAL:  Warm, trace pedal edema ?SKIN: Warm and dry ?NEUROLOGIC:  Alert and oriented x 3 ?PSYCHIATRIC:  Normal affect  ? ?ASSESSMENT:   ? ?1. Dyspnea on exertion   ?2. Precordial pain   ?3. Primary hypertension   ?4. Diabetes mellitus with coincident hypertension ([place]   ?5. Morbid [place]   ?6. Mixed hyperlipidemia   ? ?PLAN:   ? ?In order of problems listed above: ? ?#Dyspnea  on Exertion: ?#Chest Pain: ?Patient reports significant dyspnea on exertion that has been ongoing for several months. No associated chest pain with exertion but did have an isolated episode while at rest. While his dyspnea may be related to underlying deconditioning due to limited mobility, he has several risk factors for CAD including HTN, DMII and morbid obesity. Will check TTE and CTA for further evaluation. ?-Check TTE ?-Check coronary CTA ? ?#HTN: ?Elevated to 140s today. Patient would like to discuss starting amlodipine with Dr. Dema Severin prior to starting. ?-Continue valsartan-HCTZ 320-37m daily ?-Recommended starting amlodipine 573mdaily but will discuss with Dr. WhDema Severin-Low Na diet ? ?#DMII: ?A1C 6.7 ?-Will look into coverage for GLP-1 agonist ? ?#HLD: ?LDL 129.  ?-Will recommend statin pending CTA findings ? ?#Morbid Obesity ?BMI 47. Mobility limited due to severe back pain. He is working on dietary efforts. Will look into coverage of GLP-1 agonist as well. ?-Will look into coverage of GLP-1 agonist ?-Continue lifestyle modifications as able ? ?   ? ?   ?Medication Adjustments/Labs and Tests Ordered: ?Current medicines are reviewed at length with the patient today.  Concerns regarding medicines are outlined above.  ?Orders Placed This Encounter  ?Procedures  ? CT CORONARY MORPH W/CTA COR W/SCORE W/CA W/CM &/OR WO/CM  ? Basic metabolic panel  ? EKG 12-Lead  ? ECHOCARDIOGRAM COMPLETE  ? ?Meds ordered this encounter  ?Medications  ? metoprolol  tartrate (LOPRESSOR) 100 MG tablet  ?  Sig: Take 1 tablet (100 mg total) by mouth once for 1 dose. Take 90-120 minutes prior to scan.  ?  Dispense:  1 tablet  ?  Refill:  0  ? amLODipine (NORVASC) 5 MG tablet  ?

## 2022-02-17 ENCOUNTER — Encounter: Payer: Self-pay | Admitting: Cardiology

## 2022-02-17 ENCOUNTER — Ambulatory Visit: Payer: BC Managed Care – PPO | Admitting: Cardiology

## 2022-02-17 VITALS — BP 144/88 | HR 81 | Ht 73.0 in | Wt 362.6 lb

## 2022-02-17 DIAGNOSIS — I10 Essential (primary) hypertension: Secondary | ICD-10-CM

## 2022-02-17 DIAGNOSIS — E119 Type 2 diabetes mellitus without complications: Secondary | ICD-10-CM | POA: Diagnosis not present

## 2022-02-17 DIAGNOSIS — R0609 Other forms of dyspnea: Secondary | ICD-10-CM

## 2022-02-17 DIAGNOSIS — E782 Mixed hyperlipidemia: Secondary | ICD-10-CM

## 2022-02-17 DIAGNOSIS — R072 Precordial pain: Secondary | ICD-10-CM | POA: Diagnosis not present

## 2022-02-17 MED ORDER — METOPROLOL TARTRATE 100 MG PO TABS: 100.0000 mg | ORAL_TABLET | Freq: Once | ORAL | 0 refills | Status: DC

## 2022-02-17 MED ORDER — AMLODIPINE BESYLATE 5 MG PO TABS: 5.0000 mg | ORAL_TABLET | Freq: Every day | ORAL | 3 refills | Status: DC

## 2022-02-17 NOTE — Patient Instructions (Signed)
Medication Instructions:  ? ?START TAKING AMLODIPINE 5 MG BY MOUTH DAILY ? ?*If you need a refill on your cardiac medications before your next appointment, please call your pharmacy* ? ? ?Testing/Procedures: ? ?Your physician has requested that you have an echocardiogram. Echocardiography is a painless test that uses sound waves to create images of your heart. It provides your doctor with information about the size and shape of your heart and how well your heart?s chambers and valves are working. This procedure takes approximately one hour. There are no restrictions for this procedure. ? ? ? ? ?Your cardiac CT will be scheduled at one of the below locations:  ? ?[facility] ?[address] ?[place], [place] [address] ?[phone] ? ? ?If scheduled at [facility], please arrive at the [facility] and Children's Entrance (Entrance C2) of [facility] 30 minutes prior to test start time. ?You can use the FREE valet parking offered at entrance C (encouraged to control the heart rate for the test)  ?Proceed to the [facility] Radiology Department (first floor) to check-in and test prep. ? ?All radiology patients and guests should use entrance C2 at [facility], accessed from [facility], even though the hospital's physical address listed is [address] ? ? ? ? ?Please follow these instructions carefully (unless otherwise directed): ? ?Hold all erectile dysfunction medications at least 3 days (72 hrs) prior to test. ? ?On the Night Before the Test: ?Be sure to Drink plenty of water. ?Do not consume any caffeinated/decaffeinated beverages or chocolate 12 hours prior to your test. ?Do not take any antihistamines 12 hours prior to your test. ? ? ?On the Day of the Test: ?Drink plenty of water until 1 hour prior to the test. ?Do not eat any food 4 hours prior to the test. ?You may take your regular medications prior to the test.  ?Take metoprolol 100 MG BY MOUTH  (Lopressor) two hours prior to test. ?HOLD (DIOVAN) Hydrochlorothiazide morning of the test. ? ?     ?After the Test: ?Drink plenty of water. ?After receiving IV contrast, you may experience a mild flushed feeling. This is normal. ?On occasion, you may experience a mild rash up to 24 hours after the test. This is not dangerous. If this occurs, you can take Benadryl 25 mg and increase your fluid intake. ?If you experience trouble breathing, this can be serious. If it is severe call 911 IMMEDIATELY. If it is mild, please call our office. ?If you take any of these medications: Glipizide/Metformin, Avandament, Glucavance, please do not take 48 hours after completing test unless otherwise instructed. ? ?We will call to schedule your test 2-4 weeks out understanding that some insurance companies will need an authorization prior to the service being performed.  ? ?For non-scheduling related questions, please contact the cardiac imaging nurse navigator should you have any questions/concerns: ?Marchia Bond, Cardiac Imaging Nurse Navigator ?Gordy Clement, Cardiac Imaging Nurse Navigator ?[facility] ?Direct Office Dial: [phone]  ? ?For scheduling needs, including cancellations and rescheduling, please call [place], [phone]. ? ? ? ?Follow-Up: ?At [facility], you and your health needs are our priority.  As part of our continuing mission to provide you with exceptional heart care, we have created designated Provider Care Teams.  These Care Teams include your primary Cardiologist (physician) and Advanced Practice Providers (APPs -  Physician Assistants and Nurse Practitioners) who all work together to provide you with the care you  need, when you need it. ? ?We recommend signing up for the patient portal called "MyChart".  Sign up information is provided on this After Visit Summary.  MyChart is used to connect with patients for Virtual Visits (Telemedicine).  Patients are able to view  lab/test results, encounter notes, upcoming appointments, etc.  Non-urgent messages can be sent to your provider as well.   ?To learn more about what you can do with MyChart, go to [email or web address].   ? ?Your next appointment:   ?6 month(s) ? ?The format for your next appointment:   ?In Person ? ?Provider:   ?DR. PEMBERTON  ? ?Important Information About Sugar ? ? ? ? ? ? ?

## 2022-02-18 ENCOUNTER — Telehealth: Payer: Self-pay | Admitting: Pharmacist

## 2022-02-18 NOTE — Telephone Encounter (Signed)
Pt referred by Dr Johney Frame to initiate TKP5WS therapy for weight loss. Has Hx of DM with prior A1c up to 7.3% in 2021 but A1c has been < 6.5% since then and pt is not on any DM medications. ? ?Will submit prior authorization for [facility] then follow up with pt once determination is made. ?

## 2022-02-19 MED ORDER — WEGOVY 0.25 MG/0.5ML ~~LOC~~ SOAJ: 0.2500 mg | SUBCUTANEOUS | 0 refills | Status: DC

## 2022-02-19 NOTE — Telephone Encounter (Signed)
[facility] prior authorization approved through 09/21/22. Pt chart screened, no contraindications to therapy noted. Most recent A1c 6.7% on 01/14/22 at [facility]. Called pt to discuss starting. He is interested in starting [place]. Rx sent to pharmacy, pt aware to store in [place] and bring with him to appt. Scheduled next week after echo per pt preference. ?

## 2022-02-24 ENCOUNTER — Ambulatory Visit
Admission: RE | Admit: 2022-02-24 | Discharge: 2022-02-24 | Disposition: A | Discharge status: Home / Self Care | Payer: BC Managed Care – PPO | Source: Ambulatory Visit | Attending: Family Medicine | Admitting: Family Medicine

## 2022-02-24 ENCOUNTER — Telehealth: Payer: Self-pay | Admitting: *Deleted

## 2022-02-24 DIAGNOSIS — R7401 Elevation of levels of liver transaminase levels: Secondary | ICD-10-CM

## 2022-02-24 NOTE — Telephone Encounter (Signed)
-----   Message from Melony Overly sent at 02/24/2022  1:28 PM EDT ----- ?Regarding: ct heart ?Scheduled 03/12/22 at 7:45 ? ?

## 2022-02-25 NOTE — Progress Notes (Signed)
Patient ID: Luke Larsen                 DOB: 1955-12-09                    MRN: [id number]     HPI: Kasheem Toner is a 66 y.o. male patient referred to pharmacy clinic by Dr. Johney Frame to initiate weight loss therapy with GLP1-RA. PMH is significant for obesity, DMII, HTN, HLD, and OSA. Most recent BMI 47.47. Patient has back pain that limits his activity. He can walk for about 30 min before his back starts to hurt too bad and he has to sit down. He does have free weights at home that he uses every other day. [facility] about 3 years ago. Lost a good amount of weight. Has gained about 30lb of that back. He has been trying to eat more vegetables and fruit. Eats a good amount of fast food/take out. His biggest problem areas per pt is chocolate and Pepsi. He has cut back to 1 16oz Pepsi per day recently. He is working on cutting his portions.  No personal or family hx of MTC or MENS2. No hx of pancreatitis or gallstones. He does say he has a bad gut. We reviewed the possible GI issues with [facility]. BP has been elevated. Wants to talk to his PCP about HTN before adding medication that Dr. Johney Frame suggested.   Current weight management medications: none  Previously tried meds: none  Current meds that may affect weight: none  Baseline weight/BMI: 359.8lb/47.47  Insurance payor: state health plan  Diet:  -Breakfast: yogurt and eggs -Lunch: fast food -Dinner: take out -Snacks: PB crackers, chips, pretzels  -Drinks: water, pepsi (16 oz per day)  Exercise: walks some and increasing slowly, free weights every other day  Family History: The patient's family history includes Cancer in his mother; Diabetes in his father; Hyperlipidemia in his father; Hypertension in his father; Obesity in his father.  Social History:  Social History   Socioeconomic History   Marital status: Married    Spouse name: Strider Vallance   Number of children: 2   Years of  education: B.A.S   Highest education level: Not on file  Occupational History   Occupation: Tree surgeon  Tobacco Use   Smoking status: Never   Smokeless tobacco: Never  Vaping Use   Vaping Use: Never used  Substance and Sexual Activity   Alcohol use: No    Comment: Quit drinking alcohol 108yr ago;very little   Drug use: No   Sexual activity: Not on file  Other Topics Concern   Not on file  Social History Narrative   Pt lives at home with his spouse and daughter.   Caffeine Use- Quit 01/04/13   Social Determinants of Health   Financial Resource Strain: Not on file  Food Insecurity: Not on file  Transportation Needs: Not on file  Physical Activity: Not on file  Stress: Not on file  Social Connections: Not on file  Intimate Partner Violence: Not on file     Labs: Lab Results  Component Value Date   HGBA1C 5.4 12/06/2020    Wt Readings from Last 1 Encounters:  02/17/22 (!) 362 lb 9.6 oz (164.5 kg)    BP Readings from Last 1 Encounters:  02/17/22 (!) 144/88   Pulse Readings from Last 1 Encounters:  02/17/22 81       Component Value Date/Time   CHOL  193 07/16/2020 1210   TRIG 148 07/16/2020 1210   HDL 43 07/16/2020 1210   CHOLHDL 4.5 07/16/2020 1210   LDLCALC 124 (H) 07/16/2020 1210    Past Medical History:  Diagnosis Date   Arthritis    Back pain    Constipation    Diabetes mellitus without complication (HCC)    Dyspnea    Gout    Hip pain    History of kidney stones    Hypercholesterolemia    Hypertension    Joint pain    Lower extremity edema    Neck pain    Shoulder pain    Sleep apnea    Vitamin D deficiency     Current Outpatient Medications on File Prior to Visit  Medication Sig Dispense Refill   ACCU-CHEK GUIDE test strip USE AS DIRECTED UP TO 4 TIMES A DAY 100 strip 0   amLODipine (NORVASC) 5 MG tablet Take 1 tablet (5 mg total) by mouth daily. 90 tablet 3   blood glucose meter kit and supplies KIT Dispense based on patient and  insurance preference. Use up to four times daily as directed. (FOR ICD-9 250.00, 250.01). 1 each 0   Eszopiclone 3 MG TABS Take 3 mg by mouth at bedtime.     methocarbamol (ROBAXIN) 500 MG tablet Take 1-2 tablets (500-1,000 mg total) by mouth every 6 (six) hours as needed for muscle spasms. 60 tablet 0   metoprolol tartrate (LOPRESSOR) 100 MG tablet Take 1 tablet (100 mg total) by mouth once for 1 dose. Take 90-120 minutes prior to scan. 1 tablet 0   Multiple Vitamin (MULTIVITAMIN WITH MINERALS) TABS tablet Take 1 tablet by mouth daily.     naloxone (NARCAN) nasal spray 4 mg/0.1 mL Place 1 spray into the nose as needed. overdose     Oxycodone HCl 10 MG TABS Take 10 mg by mouth every 4 (four) hours as needed for severe pain.     polyethylene glycol (MIRALAX / GLYCOLAX) 17 g packet Take 17 g by mouth daily as needed for moderate constipation.     pregabalin (LYRICA) 100 MG capsule Take 100 mg by mouth 2 (two) times daily. (Patient not taking: Reported on 02/17/2022)     valsartan-hydrochlorothiazide (DIOVAN-HCT) 320-25 MG tablet Take 1 tablet by mouth daily.     WEGOVY 0.25 MG/0.5ML SOAJ Inject 0.25 mg into the skin once a week. 2 mL 0   No current facility-administered medications on file prior to visit.    Allergies  Allergen Reactions   Pravastatin Sodium     Myalgias   Qsymia [Phentermine-Topiramate]     tired, paresthesias   Cymbalta [Duloxetine Hcl] Other (See Comments)    "It made me feel really weird, awful."     Assessment/Plan:  1. Weight loss - Patient has not met goal of at least 5% of body weight loss with comprehensive lifestyle modifications alone in the past 3-6 months. Pharmacotherapy is appropriate to pursue as augmentation. Will start Wegovy 0.51m weekly. Confirmed patient  no personal or family history of medullary thyroid carcinoma (MTC) or Multiple Endocrine Neoplasia syndrome type 2 (MEN 2).   Advised patient on common side effects including nausea, diarrhea,  dyspepsia, decreased appetite, and fatigue. Counseled patient on reducing meal size and how to titrate medication to minimize side effects. Counseled patient to call if intolerable side effects or if experiencing dehydration, abdominal pain, or dizziness. Patient will adhere to dietary modifications and will target at least 150 minutes of moderate intensity  exercise weekly.   We talked about how exercise in spurts on 5 or more min is fine. Can walk for 10 min take a break if needed and try another 10 min later. He knows he needs to cut out soda and is working on decreasing this. I encouraged him to meal plan or prep for lunches and or dinner. He will often wait for his wife to come home and then they end up eating take out. We talked about meals such as [place] meatballs and homemade red sauce  or bean soups that can be made in large batches and frozen. We discussed eating real food, avoiding food with long food ingredients and ingredients that you wouldn't find in your kitchen. Limiting fast food and take out.   Injection technique reviewed at today's visit and patient successfully self-administered first dose of Wegovy 0.10m into the fatty tissue of the abdomen.  Titration Plan:  Will plan to follow the titration plan as below, pending patient is tolerating each dose before increasing to the next. Can slow titration if needed for tolerability.    - Month 1: Inject 0.238msubcutaneously once weekly for 4 weeks - Month 2: Inject 0.5 subcutaneously once weekly for 4 weeks - Month 3: Inject 1 subcutaneously once weekly for 4 weeks - Month 4: Inject 1.7 subcutaneously once weekly for 4 weeks - Month 5: Inject 2.4 subcutaneously once weekly  Follow up in 1 month via telephone and 3 months in person.

## 2022-02-26 ENCOUNTER — Ambulatory Visit: Payer: BC Managed Care – PPO | Admitting: Pharmacist

## 2022-02-26 ENCOUNTER — Ambulatory Visit (HOSPITAL_COMMUNITY): Payer: BC Managed Care – PPO | Attending: Cardiology

## 2022-02-26 VITALS — BP 148/84 | HR 67 | Ht 73.0 in | Wt 359.8 lb

## 2022-02-26 DIAGNOSIS — R072 Precordial pain: Secondary | ICD-10-CM | POA: Insufficient documentation

## 2022-02-26 LAB — ECHOCARDIOGRAM COMPLETE
Area-P 1/2: 3.5 cm2
S' Lateral: 3.1 cm

## 2022-02-26 MED ORDER — PERFLUTREN LIPID MICROSPHERE
1.0000 mL | INTRAVENOUS | Status: AC | PRN
  Administered 2022-02-26: 3 mL via INTRAVENOUS
  Administered 2022-02-26: 1 mL via INTRAVENOUS

## 2022-02-26 NOTE — Patient Instructions (Signed)
Work on cutting out soda ?Increase exercise- its ok to walk just 10 min and then rest, try again later ?Try meal planning or prepping ?Eat real food ? ?GLP-1 Receptor Agonist Counseling Points ?This medication reduces your appetite and may make you feel fuller longer.  ?Stop eating when your body tells you that you are full. This will likely happen sooner than you are used to. ?Store your medication in the fridge until you are ready to use it. ?Inject your medication in the fatty tissue of your lower abdominal area (2 inches away from belly button) or upper outer thigh. Rotate injection sites. ?Common side effects include: nausea, diarrhea/constipation, and heartburn, and are more likely to occur if you overeat. ? ?Dosing schedule: ?- Month 1: Inject 0.'25mg'$  subcutaneously once weekly for 4 weeks ?- Month 2: Inject 0.5 subcutaneously once weekly for 4 weeks ?- Month 3: Inject 1 subcutaneously once weekly for 4 weeks ?- Month 4: Inject 1.7 subcutaneously once weekly for 4 weeks ?- Month 5: Inject 2.4 subcutaneously once weekly ? ?Tips for living a healthier life ? ? ? ? ?Building a Naval architect Diet ?Make most of your meal vegetables and fruits - ? of your plate. ?Aim for color and variety, and remember that potatoes don?t count as vegetables on the Healthy Eating Plate because of their negative impact on blood sugar. ? ?Go for whole grains - ? of your plate. ?Whole and intact grains--whole wheat, barley, wheat berries, quinoa, oats, brown rice, and foods made with them, such as whole wheat pasta--have a milder effect on blood sugar and insulin than white bread, white rice, and other refined grains. ? ?Protein power - ? of your plate. ?Fish, poultry, beans, and nuts are all healthy, versatile protein sources--they can be mixed into salads, and pair well with vegetables on a plate. Limit red meat, and avoid processed meats such as bacon and sausage. ? ?Healthy plant oils - in moderation. ?Choose healthy  vegetable oils like olive, canola, soy, corn, sunflower, peanut, and others, and avoid partially hydrogenated oils, which contain unhealthy trans fats. Remember that low-fat does not mean ?healthy.? ? ?Drink water, coffee, or tea. ?Skip sugary drinks, limit milk and dairy products to one to two servings per day, and limit juice to a small glass per day. ? ?Stay active. ?The red figure running across the [place] is a reminder that staying active is also important in weight control. ? ?The main message of the Healthy Eating Plate is to focus on diet quality: ? ?The type of carbohydrate in the diet is more important than the amount of carbohydrate in the diet, because some sources of carbohydrate--like vegetables (other than potatoes), fruits, whole grains, and beans--are healthier than others. ?The Healthy Eating Plate also advises consumers to avoid sugary beverages, a major source of calories--usually with little nutritional value--in the American diet. ?The Healthy Eating Plate encourages consumers to use healthy oils, and it does not set a maximum on the percentage of calories people should get each day from healthy sources of fat. In this way, the Healthy Eating Plate recommends the opposite of the low-fat message promoted for decades by the USDA. ? ?DeskDistributor.no ? ?SUGAR ? ?Sugar is a huge problem in the modern day diet. Sugar is a big contributor to heart disease, diabetes, high triglyceride levels, fatty liver disease and obesity. Sugar is hidden in almost all packaged foods/beverages. Added sugar is extra sugar that is added beyond what is naturally found and  has no nutritional benefit for your body. The American Heart Association recommends limiting added sugars to no more than 25g for women and 36 grams for men per day. There are many names for sugar including maltose, sucrose (names ending in "ose"), high fructose corn syrup,  molasses, cane sugar, corn sweetener, raw sugar, syrup, honey or fruit juice concentrate.  ? ?One of the best ways to limit your added sugars is to stop drinking sweetened beverages such as soda, sweet tea, and fruit juice. ? ?There is 65g of added sugars in one 20oz bottle of Coke! That is equal to 7.5 donuts.  ? ?Pay attention and read all nutrition facts labels. Below is an examples of a nutrition facts label. The #1 is showing you the total sugars where the # 2 is showing you the added sugars. This one serving has almost the max amount of added sugars per day! ? ? ? ? ?20 oz Soda ?65g Sugar = 7.5 Glazed Donuts ? ?16oz Energy  ?Drink ?54g Sugar = 6.5 Glazed Donuts ? ?Large Sweet  ?Tea ?38g Sugar = 4 Glazed Donuts ? ?20oz Sports  ?Drink ?34g Sugar = 3.5 Glazed Donuts ? ?8oz Chocolate Milk ?24g Sugar =2.5 Glazed Donuts ? ?8oz Orange  ?Juice ?21g Sugar = 2 Glazed Donuts ? ?1 Juice Box ?14g Sugar = 1.5 Glazed Donuts ? ?16oz Water= NO SUGAR!! ? ?EXERCISE ? ?Exercise is good. We?ve all heard that. In an ideal world, we would all have time and resources to get plenty of it. When you are active, your heart pumps more efficiently and you will feel better.  Multiple studies show that even walking regularly has benefits that include living a longer life. The American Heart Association recommends 150 minutes per week of exercise (30 minutes per day most days of the week). You can do this in any increment you wish. Nine or more 10-minute walks count. So does an hour-long exercise class. Break the time apart into what will work in your life. Some of the best things you can do include walking briskly, jogging, cycling or swimming laps. Not everyone is ready to ?exercise.? Sometimes we need to start with just getting active. Here are some easy ways to be more active throughout the day: ? Take the stairs instead of the elevator ? Go for a 10-15 minute walk during your lunch break (find a friend to make it more enjoyable) ? When  shopping, park at the back of the parking lot ? If you take public transportation, get off one stop early and walk the extra distance ? Pace around while making phone calls ? ?Check with your doctor if you aren?t sure what your limitations may be. Always remember to drink plenty of water when doing any type of exercise. Don?t feel like a failure if you?re not getting the 90-150 minutes per week. If you started by being a couch potato, then just a 10-minute walk each day is a huge improvement. Start with little victories and work your way up. ? ? ?HEALTHY EATING TIPS ? ?When looking to improve your eating habits, whether to lose weight, lower blood pressure or just be healthier, it helps to know what a serving size is.  ? ?Grains ?1 slice of bread, ? bagel, ? cup pasta or rice  Vegetables ?1 cup fresh or raw vegetables, ? cup cooked or canned ?Fruits ?1 piece of medium sized fruit, ? cup canned,   Meats/Proteins ?? cup dried  1 oz meat, 1 egg, ? cup cooked beans, nuts or seeds ? ?Dairy        Fats ?Individual yogurt container, 1 cup (8oz)    1 teaspoon margarine/butter or vegetable  ?milk or milk alternative, 1 slice of cheese          oil; 1 tablespoon mayonnaise or salad dressing                 ? ?Plan ahead: make a menu of the meals for a week then create a grocery list to go with that menu. Consider meals that easily stretch into a night of leftovers, such as stews or casseroles. Or consider making two of your favorite meal and put one in the freezer for another night. Try a night or two each week that is ?meatless? or ?no cook? such as salads. When you get home from the grocery store wash and prepare your vegetables and fruits. Then when you need them they are ready to go.  ? ?Tips for going to the grocery store: ? [place] store or generic brands ? Check the weekly ad from your store on-line or in their in-store flyer ? Look at the unit price on the shelf tag to compare/contrast the costs of different items ?  Buy fruits/vegetables in season ? Carrots, bananas and apples are low-cost, naturally healthy items ? If meats or frozen vegetables are on sale, buy some extras and put in your freezer ? Limit buying prepared or

## 2022-03-10 ENCOUNTER — Telehealth (HOSPITAL_COMMUNITY): Payer: Self-pay | Admitting: Emergency Medicine

## 2022-03-10 NOTE — Telephone Encounter (Signed)
Reaching out to patient to offer assistance regarding upcoming cardiac imaging study; pt verbalizes understanding of appt date/time, parking situation and where to check in, pre-test NPO status and medications ordered, and verified current allergies; name and call back number provided for further questions should they arise Marchia Bond RN Navigator Cardiac Imaging [facility] [phone] office [phone] cell   '100mg'$  metoprolol tartrate  Arrival 730 Difficult IV

## 2022-03-12 ENCOUNTER — Ambulatory Visit (HOSPITAL_COMMUNITY)
Admission: RE | Admit: 2022-03-12 | Discharge: 2022-03-12 | Disposition: A | Discharge status: Home / Self Care | Payer: BC Managed Care – PPO | Source: Ambulatory Visit | Attending: Cardiology | Admitting: Cardiology

## 2022-03-12 DIAGNOSIS — R072 Precordial pain: Secondary | ICD-10-CM

## 2022-03-12 MED ORDER — NITROGLYCERIN 0.4 MG SL SUBL
0.8000 mg | SUBLINGUAL_TABLET | Freq: Once | SUBLINGUAL | Status: AC
  Administered 2022-03-12: 0.8 mg via SUBLINGUAL

## 2022-03-12 MED ORDER — NITROGLYCERIN 0.4 MG SL SUBL
SUBLINGUAL_TABLET | SUBLINGUAL | Status: AC
  Filled 2022-03-12: qty 2

## 2022-03-12 MED ORDER — IOHEXOL 350 MG/ML SOLN
100.0000 mL | Freq: Once | INTRAVENOUS | Status: AC | PRN
  Administered 2022-03-12: 100 mL via INTRAVENOUS

## 2022-03-13 ENCOUNTER — Telehealth: Payer: Self-pay | Admitting: Cardiology

## 2022-03-19 ENCOUNTER — Telehealth: Payer: Self-pay | Admitting: Pharmacist

## 2022-03-19 MED ORDER — WEGOVY 0.5 MG/0.5ML ~~LOC~~ SOAJ: 0.5000 mg | SUBCUTANEOUS | 0 refills | Status: DC

## 2022-03-19 NOTE — Telephone Encounter (Addendum)
Called patient to follow up on Wegovy. Following up to see how he is tolerating, how he is doing with cutting back on fast food, meal prepping and exercising. I have sent in the Rx for 0.'5mg'$  since supply is so hard to find. LVM for pt to call back.  He will also need 3 month apt scheduled

## 2022-03-26 ENCOUNTER — Encounter: Payer: Self-pay | Admitting: Cardiology

## 2022-03-26 MED ORDER — OZEMPIC (0.25 OR 0.5 MG/DOSE) 2 MG/3ML ~~LOC~~ SOPN: 0.5000 mg | PEN_INJECTOR | SUBCUTANEOUS | 0 refills | Status: DC

## 2022-03-26 NOTE — Telephone Encounter (Signed)
Spoke with pt on the phone. He is aware to start Ozempic 0.'5mg'$  once weekly, his pharmacy covers this at $25/month.

## 2022-03-26 NOTE — Telephone Encounter (Signed)
Pt sent MyChart message saying his pharmacy can't get any Wegovy in stock. Sent in rx for Ozempic to see if his insurance covers this without PA. He does have DM. Pharmacy states copay is $25 and Ozempic should be coming in their shipment later today. Will need f/u for dose titration of Ozempic until he can change back to [facility].

## 2022-03-27 ENCOUNTER — Other Ambulatory Visit: Payer: Self-pay | Admitting: Cardiology

## 2022-03-27 ENCOUNTER — Encounter: Payer: Self-pay | Admitting: Cardiology

## 2022-03-27 MED ORDER — ROSUVASTATIN CALCIUM 10 MG PO TABS: 10.0000 mg | ORAL_TABLET | Freq: Every day | ORAL | 3 refills | Status: AC

## 2022-04-07 ENCOUNTER — Other Ambulatory Visit: Payer: Self-pay | Admitting: Orthopedic Surgery

## 2022-04-07 DIAGNOSIS — M545 Other chronic pain: Secondary | ICD-10-CM

## 2022-04-08 ENCOUNTER — Other Ambulatory Visit: Payer: Self-pay | Admitting: Orthopedic Surgery

## 2022-04-08 DIAGNOSIS — M546 Pain in thoracic spine: Secondary | ICD-10-CM

## 2022-04-08 DIAGNOSIS — M5416 Radiculopathy, lumbar region: Secondary | ICD-10-CM

## 2022-04-15 ENCOUNTER — Other Ambulatory Visit: Payer: Self-pay | Admitting: Pharmacist

## 2022-04-15 MED ORDER — SEMAGLUTIDE (1 MG/DOSE) 4 MG/3ML ~~LOC~~ SOPN: 1.0000 mg | PEN_INJECTOR | SUBCUTANEOUS | 0 refills | Status: DC

## 2022-04-19 ENCOUNTER — Ambulatory Visit
Admission: RE | Admit: 2022-04-19 | Discharge: 2022-04-19 | Disposition: A | Discharge status: Home / Self Care | Payer: BC Managed Care – PPO | Source: Ambulatory Visit | Attending: Orthopedic Surgery | Admitting: Orthopedic Surgery

## 2022-04-19 DIAGNOSIS — M546 Pain in thoracic spine: Secondary | ICD-10-CM

## 2022-04-19 DIAGNOSIS — M5416 Radiculopathy, lumbar region: Secondary | ICD-10-CM

## 2022-05-13 ENCOUNTER — Ambulatory Visit
Admission: RE | Admit: 2022-05-13 | Discharge: 2022-05-13 | Disposition: A | Discharge status: Home / Self Care | Payer: BC Managed Care – PPO | Source: Ambulatory Visit | Attending: Orthopedic Surgery | Admitting: Orthopedic Surgery

## 2022-05-13 DIAGNOSIS — M545 Low back pain, unspecified: Secondary | ICD-10-CM

## 2022-05-14 ENCOUNTER — Telehealth: Payer: Self-pay | Admitting: Pharmacist

## 2022-05-14 MED ORDER — WEGOVY 1.7 MG/0.75ML ~~LOC~~ SOAJ: 1.7000 mg | SUBCUTANEOUS | 0 refills | Status: DC

## 2022-05-14 NOTE — Telephone Encounter (Signed)
Called patient to see how he was going on Ozempic '1mg'$  and to see if patient would like to switch back to [facility] 1.7. LVM for pt to call back.

## 2022-05-14 NOTE — Telephone Encounter (Signed)
Patient doing well on ozempic '1mg'$ . Ready to switch back to wegovy. Rx for wegovy 1.'7mg'$  sent to pharmacy. Will follow up in 3 weeks.

## 2022-05-27 ENCOUNTER — Encounter (INDEPENDENT_AMBULATORY_CARE_PROVIDER_SITE_OTHER): Payer: Self-pay

## 2022-06-05 NOTE — Telephone Encounter (Signed)
Called pt and LVM to see how pt was doing on [facility] 1.'7mg'$ .  Due for 3 month follow up.

## 2022-06-15 ENCOUNTER — Other Ambulatory Visit: Payer: Self-pay | Admitting: Orthopedic Surgery

## 2022-06-15 DIAGNOSIS — G8929 Other chronic pain: Secondary | ICD-10-CM

## 2022-06-17 ENCOUNTER — Ambulatory Visit
Admission: RE | Admit: 2022-06-17 | Discharge: 2022-06-17 | Disposition: A | Discharge status: Home / Self Care | Payer: BC Managed Care – PPO | Source: Ambulatory Visit | Attending: Orthopedic Surgery | Admitting: Orthopedic Surgery

## 2022-06-17 DIAGNOSIS — M545 Low back pain, unspecified: Secondary | ICD-10-CM

## 2022-06-17 MED ORDER — METHYLPREDNISOLONE ACETATE 40 MG/ML INJ SUSP (RADIOLOG
80.0000 mg | Freq: Once | INTRAMUSCULAR | Status: AC
  Administered 2022-06-17: 80 mg via EPIDURAL

## 2022-06-17 MED ORDER — IOPAMIDOL (ISOVUE-M 200) INJECTION 41%
1.0000 mL | Freq: Once | INTRAMUSCULAR | Status: AC
  Administered 2022-06-17: 1 mL via EPIDURAL

## 2022-06-17 NOTE — Discharge Instructions (Signed)

## 2022-06-30 ENCOUNTER — Encounter: Payer: Self-pay | Admitting: Pharmacist

## 2022-07-02 ENCOUNTER — Other Ambulatory Visit: Payer: Self-pay | Admitting: Orthopedic Surgery

## 2022-07-02 DIAGNOSIS — M545 Other chronic pain: Secondary | ICD-10-CM

## 2022-07-07 MED ORDER — WEGOVY 2.4 MG/0.75ML ~~LOC~~ SOAJ: 2.4000 mg | SUBCUTANEOUS | 11 refills | Status: DC

## 2022-07-07 NOTE — Telephone Encounter (Signed)
Patient doing well. PCP had increased the dose for him to 2.'4mg'$  weekly. Doing well. 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

## 2022-07-07 NOTE — Addendum Note (Signed)
Addended by: Marcelle Overlie D on: 07/07/2022 01:28 PM   Modules accepted: Orders

## 2022-07-13 ENCOUNTER — Other Ambulatory Visit: Payer: BC Managed Care – PPO

## 2022-08-24 ENCOUNTER — Ambulatory Visit: Payer: BC Managed Care – PPO | Admitting: Cardiology

## 2022-10-21 NOTE — Progress Notes (Deleted)
Cardiology Office Note:    Date:  10/21/2022   ID:  Luke Larsen, DOB 1956/01/27, MRN [id number]  PCP:  Luke Stains, MD   [facility] Providers Cardiologist:  None {   Referring MD: Luke Stains, MD    History of Present Illness:    Luke Larsen is a 67 y.o. male with a hx of DMII, HTN, HLD, and OSA who presents to clinic for follow-up.  Patient initially seen in 02/2022 for dyspnea on exertion. TTE 02/2022 with LVEF 55-60%, G1DD, normal RV, no significant valve disease. Coronary CTA 02/2022 showed mild nonobstructive disease. Ca score 559 (88%).   Today, ***  Past Medical History:  Diagnosis Date   Arthritis    Back pain    Constipation    Diabetes mellitus without complication (HCC)    Dyspnea    Gout    Hip pain    History of kidney stones    Hypercholesterolemia    Hypertension    Joint pain    Lower extremity edema    Neck pain    Shoulder pain    Sleep apnea    Vitamin D deficiency     Past Surgical History:  Procedure Laterality Date   ANTERIOR LAT LUMBAR FUSION Left 12/05/2020   Procedure: LEFT LATERAL INTERBODY FUSION LUMBAR 2 - LUMBAR 3 WITH INSTRUMENTATION AND ALLOGRAFT;  Surgeon: Luke Bob, MD;  Location: [place];  Service: Orthopedics;  Laterality: Left;   ANTERIOR LAT LUMBAR FUSION Right 09/10/2021   Procedure: RIGHT LATERAL LUMBAR FUSION LUMBAR 3- LUMBAR 4 WITH INSTRUMENTATION AND ALLOGRAFT;  Surgeon: Luke Bob, MD;  Location: [place];  Service: Orthopedics;  Laterality: Right;   BACK SURGERY     04/2019   KIDNEY STONE SURGERY  03/28/2012    Current Medications: No outpatient medications have been marked as taking for the 10/27/22 encounter (Appointment) with Luke Bergeron, MD.     Allergies:   Pravastatin sodium, Qsymia [phentermine-topiramate], and Cymbalta [duloxetine hcl]   Social History   Socioeconomic History   Marital status: Married    Spouse name: Luke Larsen   Number of children: 2   Years of education: B.A.S    Highest education level: Not on file  Occupational History   Occupation: Tree surgeon  Tobacco Use   Smoking status: Never   Smokeless tobacco: Never  Vaping Use   Vaping Use: Never used  Substance and Sexual Activity   Alcohol use: No    Comment: Quit drinking alcohol 65yr ago;very little   Drug use: No   Sexual activity: Not on file  Other Topics Concern   Not on file  Social History Narrative   Pt lives at home with his spouse and daughter.   Caffeine Use- Quit 01/04/13   Social Determinants of Health   Financial Resource Strain: Not on file  Food Insecurity: Not on file  Transportation Needs: Not on file  Physical Activity: Not on file  Stress: Not on file  Social Connections: Not on file     Family History: The patient's family history includes Cancer in his mother; Diabetes in his father; Hyperlipidemia in his father; Hypertension in his father; Obesity in his father.  ROS:   Please see the history of present illness.    Review of Systems  Constitutional:  Positive for malaise/fatigue.  Respiratory:  Positive for shortness of breath.   Cardiovascular:  Positive for chest pain and leg swelling. Negative for palpitations, orthopnea, claudication and PND.  Gastrointestinal:  Negative for  blood in stool.  Genitourinary:  Negative for flank pain.  Musculoskeletal:  Positive for back pain, joint pain and myalgias. Negative for falls.  Neurological:  Negative for dizziness and loss of consciousness.     EKGs/Labs/Other Studies Reviewed:    The following studies were reviewed today: Coronary CTA 02/2022: FINDINGS: A 120 kV prospective scan was triggered in the descending thoracic aorta at 111 HU's. Axial non-contrast 3 mm slices were carried out through the heart. The data set was analyzed on a dedicated work station and scored using the [place]. Gantry rotation speed was 250 msecs and collimation was .6 mm. No beta blockade and 0.8 mg of sl NTG was  given. The 3D data set was reconstructed in 5% intervals of the 67-82 % of the R-R cycle. Diastolic phases were analyzed on a dedicated work station using MPR, MIP and VRT modes. The patient received 80 cc of contrast.   Aorta: Ascending aorta mildly dilated. 3.7 cm. Calcification of the aortic root. No dissection.   Aortic Valve:  Trileaflet.  No calcifications.   Coronary Arteries:  Normal coronary origin.  Right dominance.   RCA is a large dominant artery that gives rise to PDA and PLVB. There is minimal (<25%) calcified plaque in the proximal, mid, and distal RCA. There is mild (25-49%) calcified plaque in the proximal PDA.   Left main is a large artery that gives rise to LAD and LCX arteries. There is minimal (<25%)calcified plaque   LAD is a large vessel that has minimal (<25%) calcified plaque proximally and mild (25-49%) calcified plaque in the mid LAD. Minimal calcified plaque distally. Mild (25-49%) calcified plaque in D1.   LCX is a non-dominant artery that gives rise to a tiny OM1 with ostial calcification. Mild (25-49%) calcified plaque in the proximal and mid LCX. OM2 is a large branch with minimal soft plaque. One large OM1 branch. There is no plaque.   Coronary Calcium Score:   Left main: 23.2   Left anterior descending artery: 424   Left circumflex artery: 33.8   Right coronary artery: 78.6   Total: 559   Percentile: 88th   Other findings:   Normal pulmonary vein drainage into the left atrium.   Normal let atrial appendage without a thrombus.   Normal size of the pulmonary artery.   IMPRESSION: 1. Coronary calcium score of 559. This was 88th percentile for age-, race-, and sex-matched controls.   2. Normal coronary origin with right dominance.   3. There is mild plaque in the left circumflex, LAD, and RCA. Non-obstructive disease. CAD-RADS 2.   4. Recommend aggressive risk factor modification including LDL goal less than 70. TTE  02/26/22: IMPRESSIONS     1. Left ventricular ejection fraction, by estimation, is 55 to 60%. The  left ventricle has normal function. The left ventricle has no regional  wall motion abnormalities. There is mild left ventricular hypertrophy.  Left ventricular diastolic parameters  are consistent with Grade I diastolic dysfunction (impaired relaxation).   2. Right ventricular systolic function is normal. The right ventricular  size is normal. Tricuspid regurgitation signal is inadequate for assessing  PA pressure.   3. The mitral valve is normal in structure. No evidence of mitral valve  regurgitation. No evidence of mitral stenosis.   4. The aortic valve is tricuspid. There is mild calcification of the  aortic valve. Aortic valve regurgitation is not visualized. No aortic  stenosis is present.   5. The inferior vena cava is  normal in size with greater than 50%  respiratory variability, suggesting right atrial pressure of 3 mmHg.   EKG:  EKG is  ordered today.  The ekg ordered today demonstrates NSR with HR 81  Recent Labs: No results found for requested labs within last 365 days.  Recent Lipid Panel    Component Value Date/Time   CHOL 193 07/16/2020 1210   TRIG 148 07/16/2020 1210   HDL 43 07/16/2020 1210   CHOLHDL 4.5 07/16/2020 1210   LDLCALC 124 (H) 07/16/2020 1210     Risk Assessment/Calculations:           Physical Exam:    VS:  There were no vitals taken for this visit.    Wt Readings from Last 3 Encounters:  02/26/22 (!) 359 lb 12.8 oz (163.2 kg)  02/17/22 (!) 362 lb 9.6 oz (164.5 kg)  09/10/21 (!) 360 lb (163.3 kg)     GEN:  Well nourished, well developed in no acute distress HEENT: Normal NECK: No JVD; No carotid bruits CARDIAC: RRR, no murmurs, rubs, gallops RESPIRATORY:  Clear to auscultation without rales, wheezing or rhonchi  ABDOMEN: Soft, non-tender, non-distended MUSCULOSKELETAL:  Warm, trace pedal edema SKIN: Warm and dry NEUROLOGIC:  Alert  and oriented x 3 PSYCHIATRIC:  Normal affect   ASSESSMENT:    No diagnosis found.  PLAN:    In order of problems listed above:  #Mild nonobstructive CAD: Coronary CTA 02/2022 with mild disease. Ca score 559 (88%). Currently doing well.  -Continue crestor '10mg'$  daily -Continue ASA '81mg'$  daily ***  #HTN: *** -Continue valsartan-HCTZ 320-'25mg'$  daily -Continue amlodipine '5mg'$  daily -Low Na diet  #DMII: A1C 6.7 -Will look into coverage for GLP-1 agonist  #HLD: *** -Continue crestor '10mg'$  daily -Check lipids   #Morbid Obesity BMI *** -Continue wegovy -Continue lifestyle modification         Medication Adjustments/Labs and Tests Ordered: Current medicines are reviewed at length with the patient today.  Concerns regarding medicines are outlined above.  No orders of the defined types were placed in this encounter.  No orders of the defined types were placed in this encounter.   There are no Patient Instructions on file for this visit.    Signed, Luke Bergeron, MD  10/21/2022 7:57 PM    [place]

## 2022-10-26 NOTE — Progress Notes (Incomplete)
Cardiology Office Note:    Date:  10/26/2022   ID:  Luke Larsen, DOB 05/02/1956, MRN [id number]  PCP:  Harlan Stains, MD   [facility] Providers Cardiologist:  None {   Referring MD: Harlan Stains, MD    History of Present Illness:    Luke Larsen is a 67 y.o. male with a hx of DMII, HTN, HLD, and OSA who presents to clinic for follow-up.  Patient initially seen in 02/2022 for dyspnea on exertion. TTE 02/2022 with LVEF 55-60%, G1DD, normal RV, no significant valve disease. Coronary CTA 02/2022 showed mild nonobstructive disease. Ca score 559 (88%).   Today, the patient states that ***  He denies any palpitations, chest pain, shortness of breath, or peripheral edema. No lightheadedness, headaches, syncope, orthopnea, or PND.   Past Medical History:  Diagnosis Date   Arthritis    Back pain    Constipation    Diabetes mellitus without complication (HCC)    Dyspnea    Gout    Hip pain    History of kidney stones    Hypercholesterolemia    Hypertension    Joint pain    Lower extremity edema    Neck pain    Shoulder pain    Sleep apnea    Vitamin D deficiency     Past Surgical History:  Procedure Laterality Date   ANTERIOR LAT LUMBAR FUSION Left 12/05/2020   Procedure: LEFT LATERAL INTERBODY FUSION LUMBAR 2 - LUMBAR 3 WITH INSTRUMENTATION AND ALLOGRAFT;  Surgeon: Phylliss Bob, MD;  Location: [place];  Service: Orthopedics;  Laterality: Left;   ANTERIOR LAT LUMBAR FUSION Right 09/10/2021   Procedure: RIGHT LATERAL LUMBAR FUSION LUMBAR 3- LUMBAR 4 WITH INSTRUMENTATION AND ALLOGRAFT;  Surgeon: Phylliss Bob, MD;  Location: [place];  Service: Orthopedics;  Laterality: Right;   BACK SURGERY     04/2019   KIDNEY STONE SURGERY  03/28/2012    Current Medications: No outpatient medications have been marked as taking for the 10/27/22 encounter (Appointment) with Freada Bergeron, MD.     Allergies:   Pravastatin sodium, Qsymia [phentermine-topiramate], and Cymbalta  [duloxetine hcl]   Social History   Socioeconomic History   Marital status: Married    Spouse name: Nijel Flink   Number of children: 2   Years of education: B.A.S   Highest education level: Not on file  Occupational History   Occupation: Tree surgeon  Tobacco Use   Smoking status: Never   Smokeless tobacco: Never  Vaping Use   Vaping Use: Never used  Substance and Sexual Activity   Alcohol use: No    Comment: Quit drinking alcohol 29yr ago;very little   Drug use: No   Sexual activity: Not on file  Other Topics Concern   Not on file  Social History Narrative   Pt lives at home with his spouse and daughter.   Caffeine Use- Quit 01/04/13   Social Determinants of Health   Financial Resource Strain: Not on file  Food Insecurity: Not on file  Transportation Needs: Not on file  Physical Activity: Not on file  Stress: Not on file  Social Connections: Not on file     Family History: The patient's family history includes Cancer in his mother; Diabetes in his father; Hyperlipidemia in his father; Hypertension in his father; Obesity in his father.  ROS:   Please see the history of present illness.    Review of Systems  Constitutional:  Positive for malaise/fatigue.  Respiratory:  Positive for shortness  of breath.   Cardiovascular:  Positive for chest pain and leg swelling. Negative for palpitations, orthopnea, claudication and PND.  Gastrointestinal:  Negative for blood in stool.  Genitourinary:  Negative for flank pain.  Musculoskeletal:  Positive for back pain, joint pain and myalgias. Negative for falls.  Neurological:  Negative for dizziness and loss of consciousness.     EKGs/Labs/Other Studies Reviewed:    The following studies were reviewed today:  Coronary CTA 02/2022: FINDINGS: A 120 kV prospective scan was triggered in the descending thoracic aorta at 111 HU's. Axial non-contrast 3 mm slices were carried out through the heart. The data set was analyzed on  a dedicated work station and scored using the [place]. Gantry rotation speed was 250 msecs and collimation was .6 mm. No beta blockade and 0.8 mg of sl NTG was given. The 3D data set was reconstructed in 5% intervals of the 67-82 % of the R-R cycle. Diastolic phases were analyzed on a dedicated work station using MPR, MIP and VRT modes. The patient received 80 cc of contrast.   Aorta: Ascending aorta mildly dilated. 3.7 cm. Calcification of the aortic root. No dissection.   Aortic Valve:  Trileaflet.  No calcifications.   Coronary Arteries:  Normal coronary origin.  Right dominance.   RCA is a large dominant artery that gives rise to PDA and PLVB. There is minimal (<25%) calcified plaque in the proximal, mid, and distal RCA. There is mild (25-49%) calcified plaque in the proximal PDA.   Left main is a large artery that gives rise to LAD and LCX arteries. There is minimal (<25%)calcified plaque   LAD is a large vessel that has minimal (<25%) calcified plaque proximally and mild (25-49%) calcified plaque in the mid LAD. Minimal calcified plaque distally. Mild (25-49%) calcified plaque in D1.   LCX is a non-dominant artery that gives rise to a tiny OM1 with ostial calcification. Mild (25-49%) calcified plaque in the proximal and mid LCX. OM2 is a large branch with minimal soft plaque. One large OM1 branch. There is no plaque.   Coronary Calcium Score:   Left main: 23.2   Left anterior descending artery: 424   Left circumflex artery: 33.8   Right coronary artery: 78.6   Total: 559   Percentile: 88th   Other findings:   Normal pulmonary vein drainage into the left atrium.   Normal let atrial appendage without a thrombus.   Normal size of the pulmonary artery.   IMPRESSION: 1. Coronary calcium score of 559. This was 88th percentile for age-, race-, and sex-matched controls.   2. Normal coronary origin with right dominance.   3. There is mild plaque in the  left circumflex, LAD, and RCA. Non-obstructive disease. CAD-RADS 2.   4. Recommend aggressive risk factor modification including LDL goal less than 70.  TTE 02/26/22: IMPRESSIONS    1. Left ventricular ejection fraction, by estimation, is 55 to 60%. The  left ventricle has normal function. The left ventricle has no regional  wall motion abnormalities. There is mild left ventricular hypertrophy.  Left ventricular diastolic parameters  are consistent with Grade I diastolic dysfunction (impaired relaxation).   2. Right ventricular systolic function is normal. The right ventricular  size is normal. Tricuspid regurgitation signal is inadequate for assessing  PA pressure.   3. The mitral valve is normal in structure. No evidence of mitral valve  regurgitation. No evidence of mitral stenosis.   4. The aortic valve is tricuspid. There is mild  calcification of the  aortic valve. Aortic valve regurgitation is not visualized. No aortic  stenosis is present.   5. The inferior vena cava is normal in size with greater than 50%  respiratory variability, suggesting right atrial pressure of 3 mmHg.   EKG:  EKG is personally reviewed. 10/27/2022: 02/17/2022:  NSR with HR 81  Recent Labs: No results found for requested labs within last 365 days.   Recent Lipid Panel    Component Value Date/Time   CHOL 193 07/16/2020 1210   TRIG 148 07/16/2020 1210   HDL 43 07/16/2020 1210   CHOLHDL 4.5 07/16/2020 1210   LDLCALC 124 (H) 07/16/2020 1210     Risk Assessment/Calculations:           Physical Exam:    VS:  There were no vitals taken for this visit.    Wt Readings from Last 3 Encounters:  02/26/22 (!) 359 lb 12.8 oz (163.2 kg)  02/17/22 (!) 362 lb 9.6 oz (164.5 kg)  09/10/21 (!) 360 lb (163.3 kg)     GEN:  Well nourished, well developed in no acute distress HEENT: Normal NECK: No JVD; No carotid bruits CARDIAC: RRR, no murmurs, rubs, gallops RESPIRATORY:  Clear to auscultation without  rales, wheezing or rhonchi  ABDOMEN: Soft, non-tender, non-distended MUSCULOSKELETAL:  Warm, ***trace pedal edema SKIN: Warm and dry NEUROLOGIC:  Alert and oriented x 3 PSYCHIATRIC:  Normal affect   ASSESSMENT:    No diagnosis found.  PLAN:    In order of problems listed above:  #Mild nonobstructive CAD: Coronary CTA 02/2022 with mild disease. Ca score 559 (88%). Currently doing well.  -Continue crestor '10mg'$  daily -Continue ASA '81mg'$  daily ***  #HTN: *** -Continue valsartan-HCTZ 320-'25mg'$  daily -Continue amlodipine '5mg'$  daily -Low Na diet  #DMII: A1C 6.7 -Will look into coverage for GLP-1 agonist  #HLD: *** -Continue crestor '10mg'$  daily -Check lipids   #Morbid Obesity BMI *** -Continue wegovy -Continue lifestyle modification     Follow-up:    Medication Adjustments/Labs and Tests Ordered: Current medicines are reviewed at length with the patient today.  Concerns regarding medicines are outlined above.   No orders of the defined types were placed in this encounter.  No orders of the defined types were placed in this encounter.  There are no Patient Instructions on file for this visit.  I,Mathew Stumpf,acting as a Education administrator for Freada Bergeron, MD.,have documented all relevant documentation on the behalf of Freada Bergeron, MD,as directed by  Freada Bergeron, MD while in the presence of Freada Bergeron, MD.  ***   Signed, Madelin Rear  10/26/2022 11:20 AM    [place]

## 2022-10-27 ENCOUNTER — Ambulatory Visit: Payer: BC Managed Care – PPO | Admitting: Cardiology

## 2022-12-23 ENCOUNTER — Other Ambulatory Visit: Payer: Self-pay | Admitting: Orthopedic Surgery

## 2022-12-23 DIAGNOSIS — M47816 Spondylosis without myelopathy or radiculopathy, lumbar region: Secondary | ICD-10-CM

## 2022-12-30 ENCOUNTER — Ambulatory Visit
Admission: RE | Admit: 2022-12-30 | Discharge: 2022-12-30 | Disposition: A | Discharge status: Home / Self Care | Payer: BC Managed Care – PPO | Source: Ambulatory Visit | Attending: Orthopedic Surgery | Admitting: Orthopedic Surgery

## 2022-12-30 ENCOUNTER — Other Ambulatory Visit: Payer: Self-pay | Admitting: Orthopedic Surgery

## 2022-12-30 DIAGNOSIS — M47816 Spondylosis without myelopathy or radiculopathy, lumbar region: Secondary | ICD-10-CM

## 2022-12-30 MED ORDER — IOPAMIDOL (ISOVUE-M 200) INJECTION 41%: 1.0000 mL | Freq: Once | INTRAMUSCULAR | Status: DC

## 2022-12-30 MED ORDER — METHYLPREDNISOLONE ACETATE 40 MG/ML INJ SUSP (RADIOLOG: 80.0000 mg | Freq: Once | INTRAMUSCULAR | Status: DC

## 2022-12-30 NOTE — Discharge Instructions (Signed)

## 2023-08-05 NOTE — Therapy (Signed)
OUTPATIENT PHYSICAL THERAPY THORACOLUMBAR EVALUATION   Patient Name: Burnett Sistare MRN: [id number] DOB:1956-06-17, 67 y.o., male Today's Date: 08/06/2023  END OF SESSION:  PT End of Session - 08/06/23 0857     Visit Number 1    Number of Visits 20    Date for PT Re-Evaluation 10/19/23    Authorization Type [employer] MEDICARE CHOICE PPO    Progress Note Due on Visit 10    PT Start Time 0800    PT Stop Time 0845    PT Time Calculation (min) 45 min    Activity Tolerance Patient limited by pain    Behavior During Therapy WFL for tasks assessed/performed             Past Medical History:  Diagnosis Date   Arthritis    Back pain    Constipation    Diabetes mellitus without complication (HCC)    Dyspnea    Gout    Hip pain    History of kidney stones    Hypercholesterolemia    Hypertension    Joint pain    Lower extremity edema    Neck pain    Shoulder pain    Sleep apnea    Vitamin D deficiency    Past Surgical History:  Procedure Laterality Date   ANTERIOR LAT LUMBAR FUSION Left 12/05/2020   Procedure: LEFT LATERAL INTERBODY FUSION LUMBAR 2 - LUMBAR 3 WITH INSTRUMENTATION AND ALLOGRAFT;  Surgeon: Estill Bamberg, MD;  Location: [facility] OR;  Service: Orthopedics;  Laterality: Left;   ANTERIOR LAT LUMBAR FUSION Right 09/10/2021   Procedure: RIGHT LATERAL LUMBAR FUSION LUMBAR 3- LUMBAR 4 WITH INSTRUMENTATION AND ALLOGRAFT;  Surgeon: Estill Bamberg, MD;  Location: [facility];  Service: Orthopedics;  Laterality: Right;   BACK SURGERY     04/2019   KIDNEY STONE SURGERY  03/28/2012   Patient Active Problem List   Diagnosis Date Noted   Spinal stenosis 09/10/2021   Radiculopathy 12/05/2020   Facet arthropathy, lumbar 03/29/2019   Degenerative spondylolisthesis 03/29/2019   Chronic right-sided low back pain with right-sided sciatica 03/29/2019   Apnea, sleep 05/19/2014   Calculus of left ureter 05/19/2014   ED (erectile dysfunction) of organic origin 02/28/2014   Anxiety state  04/19/2013   Obesity, Class III, BMI 40-49.9 (morbid obesity) (HCC) 04/19/2013   Gastric ulcer by EGD 04/19/2013   History of colonic polyps 04/19/2013   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 He has a fusion L2-L3 and L3-L4 in 2022 and plans to get L1 fused in the near future. He reports pain in his lower back that radiates into his R LE to his knee. Pt states that his pain is present upon waking and progressively gets worse throughout the day. Pt received a cortisone shot earlier this week which is helping slightly. Pt has a 3/4 inch off set from L>R due to injury in football. He has insteps to offset difference but does not actively wear them.   PERTINENT HISTORY:  Arthritis, Back pain, DM, Dyspnea, Gout, Multiple joint pain, LE edema, sleep apnea.   PAIN:  Are you having pain? Yes: NPRS scale: 6-9/10 Pain location: Bilat LB, R LE Pain description: Throbbing, stabbing when moving in wrong way, N/T into R LE.  Aggravating factors: Stooping.  Relieving factors: Laying down, sitting.   PRECAUTIONS: None  RED FLAGS: None   WEIGHT BEARING RESTRICTIONS: No  FALLS:  Has patient fallen in last 6 months? No  LIVING ENVIRONMENT: Lives with: lives with  their spouse Lives in: House/apartment Stairs: Yes: Internal: 5 steps; on right going up and External: 12 steps; bilateral but cannot reach both Has following equipment at home: None  OCCUPATION: Transport planner   PLOF: Independent  PATIENT GOALS: Pt would like to reduce his lower back pain. He wants to get back to his hobbies of walking and playing golf.    OBJECTIVE:  Note: Objective measures were completed at Evaluation unless otherwise noted.  DIAGNOSTIC FINDINGS:  1. Prior PLIF at L2-3, with new posterior and interbody fusion at L3-4. Spinal stenosis at L3-4 is improved, with no more than residual mild bilateral subarticular stenosis now seen at this level. 2. 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 3. Disc bulge with facet hypertrophy at L4-5 with resultant mild bilateral lateral recess and foraminal stenosis, similar to prior. 4. Left eccentric endplate spurring and facet hypertrophy at L5-S1 with resultant mild left L5 foraminal stenosis, stable.  PATIENT SURVEYS:  FOTO 41.38, 50 predicted in 14 visits.   SCREENING FOR RED FLAGS: Bowel or bladder incontinence: No  COGNITION: Overall cognitive status: Within functional limits for tasks assessed     SENSATION: WFL  POSTURE: No Significant postural limitations  PALPATION: Tenderness to bilat paraspinals.   LUMBAR ROM:   Pt is unable to dissociate his lumbar spine from his hips. He demonstrates flexion with a hip hinge, rotation with hip rotation and lateral flexion from moving hips side to side. Pt is able to perform extension only by bending his knees.   LOWER EXTREMITY ROM:     Active  Right eval Left eval  Hip flexion 50% 50%  Knee flexion [facility] WFL  Knee extension WFL WFL   (Blank rows = not tested)  LOWER EXTREMITY MMT:    MMT Right eval Left eval  Hip flexion 4+ 4+  Knee flexion 4+ 4+  Knee extension  4+ 4+   (Blank rows = not tested)  LUMBAR SPECIAL TESTS:  Not tested due to pain level.   FUNCTIONAL TESTS:  5 times sit to stand: 33.24 sec  Timed up and go (TUG): 17.70 sec   GAIT: Distance walked: 25ft Assistive device utilized: None Level of assistance: Complete Independence Comments: Slow gait with decreased cadence and stride length.   TODAY'S TREATMENT:  DATE: Creating, reviewing, and completing below HEP    PATIENT EDUCATION:  Education details: Educated pt on anatomy and physiology of current symptoms, FOTO, diagnosis, prognosis, HEP,  and POC. Person educated: Patient Education method: Medical illustrator Education comprehension: verbalized understanding and returned demonstration  HOME EXERCISE PROGRAM: Access Code: [email or web address] URL: [email or web address] Date: 08/06/2023 Prepared by: Royal Hawthorn  Exercises - Supine Lower Trunk Rotation  - 2 x daily - 7 x weekly - 2 sets - 10 reps - 5 hold - Supine Transversus Abdominis Bracing with Pelvic Floor Contraction  - 2 x daily - 7 x weekly - 2 sets - 10 reps - 5 hold   ASSESSMENT:  CLINICAL IMPRESSION: Patient referred to PT for chronic lower back pain. Pt has a long history of fusions and injections to help minimize the pain. Pt demonstrates difficulty with transfers, walking and bed mobility today secondary to pain. He is interested in getting aquatic therapy prior to getting another fusion later in the year. Pt reports a  bulging of his abdominals when coming from supine to seated positions. He currently sleeps in a elevated position with a massager on his bed. Pt instructed on how to perform log rolls to help minimize his lower back pain. TPDN discussed with pt with pt reporting improvements with acupuncture in the past. Pt will benefit from aquatic therapy and other modalities,  strengthening on land to help improve functional mobility with less noted pain.   OBJECTIVE IMPAIRMENTS: decreased activity tolerance, difficulty walking, decreased balance, decreased endurance, decreased mobility, decreased ROM, decreased strength, impaired flexibility, impaired UE/LE use, postural dysfunction, and pain.  ACTIVITY LIMITATIONS: bending, lifting, carry, locomotion, cleaning, community activity, driving, and or occupation  PERSONAL FACTORS: Arthritis, Back pain, DM, Dyspnea, Gout, Multiple joint pain, LE edema, sleep apnea are also affecting patient's functional outcome.  REHAB POTENTIAL: Good  CLINICAL DECISION MAKING: Evolving/moderate complexity  EVALUATION COMPLEXITY: Moderate    GOALS: Short term PT Goals Target date: 09/03/2023 Pt will be I and compliant with HEP. Baseline:  Goal status: New Pt will decrease pain by 25% overall Baseline: Goal status: New  Long term PT goals Target date: 10/19/2023 Pt will improve lumbar ROM to 50% to improve functional mobility Baseline: Goal status: New Pt will improve  hip/knee strength to at least 5-/5 MMT to improve functional strength Baseline: Goal status: New Pt will improve FOTO to at least 50% functional to show improved function Baseline: Goal status: New Pt will reduce pain by overall 50% overall with usual activity Baseline: Goal status: New Pt will report improvements with bed mobility and transfers with 3-4/10 pain to improve functional mobility.  Baseline: Goal status: New Pt will be able to ambulate community distances at least 1000 ft WNL gait pattern without complaints Baseline: Goal status: New 7. Pt will be able to participate in recreational activities with 3-4/10 pain. Baseline: Goal status: New PLAN: PT FREQUENCY: 1-3 times per week   PT DURATION: 6-12 weeks  PLANNED INTERVENTIONS (unless contraindicated): aquatic PT, Canalith repositioning, cryotherapy, Electrical stimulation,  Iontophoresis with 4 mg/ml dexamethasome, Moist heat, traction, Ultrasound, gait training, Therapeutic exercise, balance training, neuromuscular re-education, patient/family education, prosthetic training, manual techniques, passive ROM, dry needling, taping, vasopnuematic device, vestibular, spinal manipulations, joint manipulations  PLAN FOR NEXT SESSION: Assess HEP/update PRN, continue to progress functional mobility, strengthen proximal hip, core muscles. Start aquatics. DN?    Champ Mungo, PT 08/06/2023, 8:59 AM

## 2023-08-06 ENCOUNTER — Encounter: Payer: Self-pay | Admitting: Physical Therapy

## 2023-08-06 ENCOUNTER — Ambulatory Visit: Payer: Medicare PPO | Attending: Family Medicine | Admitting: Physical Therapy

## 2023-08-06 ENCOUNTER — Other Ambulatory Visit: Payer: Self-pay

## 2023-08-06 DIAGNOSIS — R293 Abnormal posture: Secondary | ICD-10-CM | POA: Diagnosis not present

## 2023-08-06 DIAGNOSIS — R2689 Other abnormalities of gait and mobility: Secondary | ICD-10-CM | POA: Diagnosis not present

## 2023-08-06 DIAGNOSIS — M5136 Other intervertebral disc degeneration, lumbar region with discogenic back pain only: Secondary | ICD-10-CM | POA: Insufficient documentation

## 2023-08-06 DIAGNOSIS — M6281 Muscle weakness (generalized): Secondary | ICD-10-CM

## 2023-08-06 DIAGNOSIS — M5459 Other low back pain: Secondary | ICD-10-CM

## 2023-08-10 ENCOUNTER — Ambulatory Visit: Payer: Medicare PPO

## 2023-08-12 ENCOUNTER — Encounter (HOSPITAL_BASED_OUTPATIENT_CLINIC_OR_DEPARTMENT_OTHER): Payer: Self-pay | Admitting: Physical Therapy

## 2023-08-12 ENCOUNTER — Ambulatory Visit (HOSPITAL_BASED_OUTPATIENT_CLINIC_OR_DEPARTMENT_OTHER): Payer: Medicare PPO | Attending: Family Medicine | Admitting: Physical Therapy

## 2023-08-12 DIAGNOSIS — R2689 Other abnormalities of gait and mobility: Secondary | ICD-10-CM | POA: Diagnosis present

## 2023-08-12 DIAGNOSIS — M6281 Muscle weakness (generalized): Secondary | ICD-10-CM | POA: Diagnosis present

## 2023-08-12 DIAGNOSIS — M5459 Other low back pain: Secondary | ICD-10-CM | POA: Insufficient documentation

## 2023-08-12 DIAGNOSIS — R293 Abnormal posture: Secondary | ICD-10-CM | POA: Insufficient documentation

## 2023-08-12 NOTE — Therapy (Signed)
OUTPATIENT PHYSICAL THERAPY THORACOLUMBAR EVALUATION   Patient Name: Luke Larsen MRN: [id number] DOB:11/26/1955, 67 y.o., male Today's Date: 08/12/2023  END OF SESSION:  PT End of Session - 08/12/23 1357     Visit Number 2    Number of Visits 20    Date for PT Re-Evaluation 10/19/23    Authorization Type [employer] MEDIC[employer] CHOICE PPO    Progress Note Due on Visit 10    PT Start Time 1400    PT Stop Time 1445    PT Time Calculation (min) 45 min    Activity Tolerance Patient limited by pain    Behavior During Therapy WFL for tasks assessed/performed             Past Medical History:  Diagnosis Date   Arthritis    Back pain    Constipation    Diabetes mellitus without complication (HCC)    Dyspnea    Gout    Hip pain    History of kidney stones    Hypercholesterolemia    Hypertension    Joint pain    Lower extremity edema    Neck pain    Shoulder pain    Sleep apnea    Vitamin D deficiency    Past Surgical History:  Procedure Laterality Date   ANTERIOR LAT LUMBAR FUSION Left 12/05/2020   Procedure: LEFT LATERAL INTERBODY FUSION LUMBAR 2 - LUMBAR 3 WITH INSTRUMENTATION AND ALLOGRAFT;  Surgeon: Estill Bamberg, MD;  Location: [facility];  Service: Orthopedics;  Laterality: Left;   ANTERIOR LAT LUMBAR FUSION Right 09/10/2021   Procedure: RIGHT LATERAL LUMBAR FUSION LUMBAR 3- LUMBAR 4 WITH INSTRUMENTATION AND ALLOGRAFT;  Surgeon: Estill Bamberg, MD;  Location: [facility];  Service: Orthopedics;  Laterality: Right;   BACK SURGERY     04/2019   KIDNEY STONE SURGERY  03/28/2012   Patient Active Problem List   Diagnosis Date Noted   Spinal stenosis 09/10/2021   Radiculopathy 12/05/2020   Facet arthropathy, lumbar 03/29/2019   Degenerative spondylolisthesis 03/29/2019   Chronic right-sided low back pain with right-sided sciatica 03/29/2019   Apnea, sleep 05/19/2014   Calculus of left ureter 05/19/2014   ED (erectile dysfunction) of organic origin 02/28/2014   Anxiety state  04/19/2013   Obesity, Class III, BMI 40-49.9 (morbid obesity) (HCC) 04/19/2013   Gastric ulcer by EGD 04/19/2013   History of colonic polyps 04/19/2013   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  See surgeon next week to plan going forward.  Initial Subjective Pt presents with chronic lower back pain. He has a fusion L2-L3 and L3-L4 in 2022 and plans to get L1 fused in the near future. He reports pain in his lower back that radiates into his R LE to his knee. Pt states that his pain is present upon waking and progressively gets worse throughout the day. Pt received a cortisone shot earlier this week which is helping slightly. Pt has a 3/4 inch off set from L>R due to injury in football. He has insteps to offset difference but does not actively wear them.   PERTINENT HISTORY:  Arthritis, Back pain, DM, Dyspnea, Gout, Multiple joint pain, LE edema, sleep apnea.   PAIN:  Are you having pain? Yes: NPRS scale: 7/10 Pain location: Bilat LB, R LE Pain description: Throbbing, stabbing when moving in wrong way, N/T into R LE.  Aggravating factors: Stooping.  Relieving factors: Laying down, sitting.   PRECAUTIONS: None  RED FLAGS: None   WEIGHT BEARING RESTRICTIONS: No  FALLS:   Has patient fallen in last 6 months? No  LIVING ENVIRONMENT: Lives with: lives with their spouse Lives in: House/apartment Stairs: Yes: Internal: 5 steps; on right going up and External: 12 steps; bilateral but cannot reach both Has following equipment at home: None  OCCUPATION: Transport planner   PLOF: Independent  PATIENT GOALS: Pt would like to reduce his lower back pain. He wants to get back to his hobbies of walking and playing golf.    OBJECTIVE:  Note: Objective measures were completed at Evaluation unless otherwise noted.  DIAGNOSTIC FINDINGS:  1. Prior PLIF at L2-3, with new posterior and interbody fusion at L3-4. Spinal stenosis at L3-4 is improved, with no more than residual mild bilateral subarticular stenosis now seen at this level. 2. 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 3. Disc bulge with facet hypertrophy at L4-5 with resultant mild bilateral lateral recess and foraminal stenosis, similar to prior. 4. Left eccentric endplate spurring and facet hypertrophy at L5-S1 with resultant mild left L5 foraminal stenosis, stable.  PATIENT SURVEYS:  FOTO 41.38, 50 predicted in 14 visits.   SCREENING FOR RED FLAGS: Bowel or bladder incontinence: No  COGNITION: Overall cognitive status: Within functional limits for tasks assessed     SENSATION: WFL  POSTURE: No Significant postural limitations  PALPATION: Tenderness to bilat paraspinals.   LUMBAR ROM:   Pt is unable to dissociate his lumbar spine from his hips. He demonstrates flexion with a hip hinge, rotation with hip rotation and lateral flexion from moving hips side to side. Pt is able to perform extension only by bending his knees.   LOWER EXTREMITY ROM:     Active  Right eval Left eval  Hip flexion 50% 50%  Knee flexion [facility] WFL  Knee extension WFL WFL   (Blank rows = not tested)  LOWER EXTREMITY MMT:     MMT Right eval Left eval  Hip flexion 4+ 4+  Knee flexion 4+ 4+  Knee extension 4+ 4+   (Blank rows = not tested)  LUMBAR SPECIAL TESTS:  Not tested due to pain level.   FUNCTIONAL TESTS:  5 times sit to stand: 33.24 sec  Timed up and go (TUG): 17.70 sec   GAIT: Distance walked: 28ft Assistive device utilized: None Level of assistance: Complete Independence Comments: Slow gait with  decreased cadence and stride length.   TODAY'S TREATMENT:                                                                                                                              Pt seen for aquatic therapy today.  Treatment took place in water 3.5-4.75 ft in depth at the [facility] pool. Temp of water was 91.  Pt entered/exited the pool via stairs, step to pattern with hand rail.  *walking forward 4.8 ft *decompression noodle wrapped post across chest-> added 2nd wrapped anteriorly across chest  - weight shifting to find COB in vertically suspended position  - cycling: cues for tolerated range and pacing to comfort (some pain reduction *Ue support wall with noodle around chest: hip add/abd (not tolerated) *return to decompression  Pt requires the buoyancy and hydrostatic pressure of water for support, and to offload joints by unweighting joint load by at least 50 % in navel deep water and by at least 75-80% in chest to neck deep water.  Viscosity of the water is needed for resistance of strengthening. Water current perturbations provides challenge to standing balance requiring increased core activation.     PATIENT EDUCATION:  Education details: Educated pt on anatomy and physiology of current symptoms, FOTO, diagnosis, prognosis, HEP,  and POC. Person educated: Patient Education method: Medical illustrator Education comprehension: verbalized understanding and returned demonstration  HOME EXERCISE PROGRAM: Access Code: [email or web address] URL:  [email or web address] Date: 08/06/2023 Prepared by: Royal Hawthorn  Exercises - Supine Lower Trunk Rotation  - 2 x daily - 7 x weekly - 2 sets - 10 reps - 5 hold - Supine Transversus Abdominis Bracing with Pelvic Floor Contraction  - 2 x daily - 7 x weekly - 2 sets - 10 reps - 5 hold   ASSESSMENT:  CLINICAL IMPRESSION:  Pt pain limited.  Majority of session focus on pain management/reduction.  Initially posture and movement guarded. Able to relax and gain COB allowing for decompression position then added cycling. Does not tolerate hip add/abd, increases LBP.  After session pt in hot water Jacuzzi for water massage (unbilled).  Post session pt reports pain 4/10.  He is a good candidate for aquatic intervention to decrease pain sensitivity improve muscle strength and mobility. Able to add another aquatic session to schedule.    Initial Impression Patient referred to PT for chronic lower back pain. Pt has a long history of fusions and injections to help minimize the pain. Pt demonstrates difficulty with transfers, walking and bed mobility today secondary to pain. He is interested in getting aquatic therapy prior to getting another fusion later in the year. Pt reports a  bulging of his abdominals when coming from supine to seated positions. He currently sleeps in a elevated position with a massager on his bed. Pt instructed on how to perform log rolls to help minimize his lower back pain. TPDN discussed with pt with pt reporting improvements with acupuncture in  the past. Pt will benefit from aquatic therapy and other modalities, strengthening on land to help improve functional mobility with less noted pain.   OBJECTIVE IMPAIRMENTS: decreased activity tolerance, difficulty walking, decreased balance, decreased endurance, decreased mobility, decreased ROM, decreased strength, impaired flexibility, impaired UE/LE use, postural dysfunction, and pain.  ACTIVITY LIMITATIONS: bending, lifting,  carry, locomotion, cleaning, community activity, driving, and or occupation  PERSONAL FACTORS: Arthritis, Back pain, DM, Dyspnea, Gout, Multiple joint pain, LE edema, sleep apnea are also affecting patient's functional outcome.  REHAB POTENTIAL: Good  CLINICAL DECISION MAKING: Evolving/moderate complexity  EVALUATION COMPLEXITY: Moderate    GOALS: Short term PT Goals Target date: 09/03/2023 Pt will be I and compliant with HEP. Baseline:  Goal status: New Pt will decrease pain by 25% overall Baseline: Goal status: New  Long term PT goals Target date: 10/19/2023 Pt will improve lumbar ROM to 50% to improve functional mobility Baseline: Goal status: New Pt will improve  hip/knee strength to at least 5-/5 MMT to improve functional strength Baseline: Goal status: New Pt will improve FOTO to at least 50% functional to show improved function Baseline: Goal status: New Pt will reduce pain by overall 50% overall with usual activity Baseline: Goal status: New Pt will report improvements with bed mobility and transfers with 3-4/10 pain to improve functional mobility.  Baseline: Goal status: New Pt will be able to ambulate community distances at least 1000 ft WNL gait pattern without complaints Baseline: Goal status: New 7. Pt will be able to participate in recreational activities with 3-4/10 pain. Baseline: Goal status: New PLAN: PT FREQUENCY: 1-3 times per week   PT DURATION: 6-12 weeks  PLANNED INTERVENTIONS (unless contraindicated): aquatic PT, Canalith repositioning, cryotherapy, Electrical stimulation, Iontophoresis with 4 mg/ml dexamethasome, Moist heat, traction, Ultrasound, gait training, Therapeutic exercise, balance training, neuromuscular re-education, patient/family education, prosthetic training, manual techniques, passive ROM, dry needling, taping, vasopnuematic device, vestibular, spinal manipulations, joint manipulations  PLAN FOR NEXT SESSION: Assess HEP/update  PRN, continue to progress functional mobility, strengthen proximal hip, core muscles. Start aquatics. DN?    Geni Bers, PT 08/12/2023, 4:04 PM

## 2023-08-17 ENCOUNTER — Encounter (HOSPITAL_BASED_OUTPATIENT_CLINIC_OR_DEPARTMENT_OTHER): Payer: Self-pay | Admitting: Physical Therapy

## 2023-08-17 ENCOUNTER — Ambulatory Visit (HOSPITAL_BASED_OUTPATIENT_CLINIC_OR_DEPARTMENT_OTHER): Payer: Medicare PPO | Admitting: Physical Therapy

## 2023-08-17 DIAGNOSIS — M5459 Other low back pain: Secondary | ICD-10-CM

## 2023-08-17 DIAGNOSIS — M6281 Muscle weakness (generalized): Secondary | ICD-10-CM

## 2023-08-17 DIAGNOSIS — R2689 Other abnormalities of gait and mobility: Secondary | ICD-10-CM

## 2023-08-17 DIAGNOSIS — R293 Abnormal posture: Secondary | ICD-10-CM

## 2023-08-17 NOTE — Therapy (Signed)
OUTPATIENT PHYSICAL THERAPY THORACOLUMBAR TREATMENT   Patient Name: Luke Larsen MRN: [id number] DOB:25-Mar-1956, 67 y.o., male Today's Date: 08/17/2023  END OF SESSION:  PT End of Session - 08/17/23 1702     Visit Number 3    Number of Visits 20    Date for PT Re-Evaluation 10/19/23    Authorization Type [employer] MEDICARE CHOICE PPO    Progress Note Due on Visit 10    PT Start Time 1702    PT Stop Time 1734    PT Time Calculation (min) 32 min    Behavior During Therapy WFL for tasks assessed/performed             Past Medical History:  Diagnosis Date   Arthritis    Back pain    Constipation    Diabetes mellitus without complication (HCC)    Dyspnea    Gout    Hip pain    History of kidney stones    Hypercholesterolemia    Hypertension    Joint pain    Lower extremity edema    Neck pain    Shoulder pain    Sleep apnea    Vitamin D deficiency    Past Surgical History:  Procedure Laterality Date   ANTERIOR LAT LUMBAR FUSION Left 12/05/2020   Procedure: LEFT LATERAL INTERBODY FUSION LUMBAR 2 - LUMBAR 3 WITH INSTRUMENTATION AND ALLOGRAFT;  Surgeon: Estill Bamberg, MD;  Location: [facility];  Service: Orthopedics;  Laterality: Left;   ANTERIOR LAT LUMBAR FUSION Right 09/10/2021   Procedure: RIGHT LATERAL LUMBAR FUSION LUMBAR 3- LUMBAR 4 WITH INSTRUMENTATION AND ALLOGRAFT;  Surgeon: Estill Bamberg, MD;  Location: [facility];  Service: Orthopedics;  Laterality: Right;   BACK SURGERY     04/2019   KIDNEY STONE SURGERY  03/28/2012   Patient Active Problem List   Diagnosis Date Noted   Spinal stenosis 09/10/2021   Radiculopathy 12/05/2020   Facet arthropathy, lumbar 03/29/2019   Degenerative spondylolisthesis 03/29/2019   Chronic right-sided low back pain with right-sided sciatica 03/29/2019   Apnea, sleep 05/19/2014   Calculus of left ureter 05/19/2014   ED (erectile dysfunction) of organic origin 02/28/2014   Anxiety state 04/19/2013   Obesity, Class III, BMI 40-49.9  (morbid obesity) (HCC) 04/19/2013   Gastric ulcer by EGD 04/19/2013   History of colonic polyps 04/19/2013   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 04/19/2013   Nephrolithiasis - bilateral nonobstructive 04/19/2013   Other malaise and fatigue 03/21/2013   Weakness 03/21/2013   Numbness 03/21/2013    REFERRING PROVIDER: Laurann Montana, MD  REFERRING DIAG: Other intervertebral disc degeneration, lumbar region [M51.36]   Rationale for Evaluation and Treatment: Rehabilitation  THERAPY DIAG:  Abnormal posture  Other abnormalities of gait and mobility  Muscle weakness (generalized)  Other low back pain  ONSET DATE: Ongoing  SUBJECTIVE:  SUBJECTIVE STATEMENT: Pt reports that it felt good to be in the water for last appointment ,but pain remained elevated.  He met with surgeon yesterday, and they need to decide if he will have L1-L2, L4-5, L5-S1 -they will have to do probing first.   From initial evaluation: Pt presents with chronic lower back pain. He has a fusion L2-L3 and L3-L4 in 2022 and plans to get L1 fused in the near future. He reports pain in his lower back that radiates into his R LE to his knee. Pt states that his pain is present upon waking and progressively gets worse throughout the day. Pt received a cortisone shot earlier this week which is helping slightly. Pt has a 3/4 inch off set from L>R due to injury in football. He has insteps to offset difference but does not actively wear them.   PERTINENT HISTORY:  Arthritis, Back pain, DM, Dyspnea, Gout, Multiple joint pain, LE edema, sleep apnea.   PAIN:  Are you having pain? Yes: NPRS scale: 8.5/10 Pain location: Bilat LB, R LE Pain description: Throbbing, stabbing when moving in wrong way, N/T into R LE.  Aggravating factors: Stooping.   Relieving factors: Laying down, sitting.   PRECAUTIONS: None  RED FLAGS: None   WEIGHT BEARING RESTRICTIONS: No  FALLS:  Has patient fallen in last 6 months? No  LIVING ENVIRONMENT: Lives with: lives with their spouse Lives in: House/apartment Stairs: Yes: Internal: 5 steps; on right going up and External: 12 steps; bilateral but cannot reach both Has following equipment at home: None  OCCUPATION: Transport planner   PLOF: Independent  PATIENT GOALS: Pt would like to reduce his lower back pain. He wants to get back to his hobbies of walking and playing golf.    OBJECTIVE:  Note: Objective measures were completed at Evaluation unless otherwise noted.  DIAGNOSTIC FINDINGS:  1. Prior PLIF at L2-3, with new posterior and interbody fusion at L3-4. Spinal stenosis at L3-4 is improved, with no more than residual mild bilateral subarticular stenosis now seen at this level. 2. 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 3. Disc bulge with facet hypertrophy at L4-5 with resultant mild bilateral lateral recess and foraminal stenosis, similar to prior. 4. Left eccentric endplate spurring and facet hypertrophy at L5-S1 with resultant mild left L5 foraminal stenosis, stable.  PATIENT SURVEYS:  FOTO 41.38, 50 predicted in 14 visits.   SCREENING FOR RED FLAGS: Bowel or bladder incontinence: No  COGNITION: Overall cognitive status: Within functional limits for tasks assessed     SENSATION: WFL  POSTURE: No Significant postural limitations  PALPATION: Tenderness to bilat paraspinals.   LUMBAR ROM:   Pt is unable to dissociate his lumbar spine from his hips. He demonstrates flexion with a hip hinge, rotation with hip rotation and lateral flexion from moving hips side to side. Pt is able to perform extension only by bending his knees.   LOWER EXTREMITY ROM:     Active  Right eval  Left eval  Hip flexion 50% 50%  Knee flexion [facility] WFL  Knee extension WFL WFL   (Blank rows = not tested)  LOWER EXTREMITY MMT:    MMT Right eval Left eval  Hip flexion 4+ 4+  Knee flexion 4+ 4+  Knee extension 4+ 4+   (Blank rows = not tested)  LUMBAR SPECIAL TESTS:  Not tested due to pain level.   FUNCTIONAL TESTS:  5 times  sit to stand: 33.24 sec  Timed up and go (TUG): 17.70 sec   GAIT: Distance walked: 78ft Assistive device utilized: None Level of assistance: Complete Independence Comments: Slow gait with decreased cadence and stride length.   TODAY'S TREATMENT:                                                                                                                              Pt seen for aquatic therapy today.  Treatment took place in water 3.5-4.75 ft in depth at the [facility] pool. Temp of water was 91.  Pt entered/exited the pool via stairs, step to pattern with hand rail.  *in 3ft8" water, unsupported-> trial of noodle under arms- walking backwards/ forwards * side stepping with arms at side, limited tolerance, ->cues for smaller step length * straddling yellow noodle and noodle behind back under arms, unable to balance -> holding corner, attempted cycling - stopped, rested knees against wall *UE on noodle walking forward/ backward  * returned to relaxed squat at wall * back against wall - marching * UE on wall, attempted hips shift to neutral (not tolerated),  -> forward flexed trunk with alternating hip ext to toe touch x 5 each side    PATIENT EDUCATION:  Education details: intro to aquatic therapy  Person educated: Patient Education method: Medical illustrator Education comprehension: verbalized understanding and returned demonstration  HOME EXERCISE PROGRAM: Access Code: [email or web address] URL: [email or web address] Date: 08/06/2023 Prepared by: Royal Hawthorn  Exercises - Supine Lower Trunk Rotation  - 2 x daily - 7  x weekly - 2 sets - 10 reps - 5 hold - Supine Transversus Abdominis Bracing with Pelvic Floor Contraction  - 2 x daily - 7 x weekly - 2 sets - 10 reps - 5 hold   ASSESSMENT:  CLINICAL IMPRESSION:  Pt with high pain sensitivity; very limited tolerance to exercises in water.  Minor relief when vertically suspended with assistance of noodle under arms.  Trunk ext to neutral increased radicular symptoms and pain to top of foot. Session cut short due to elevated pain and need for use of restroom.   Will continue to progress as tolerated.    Initial Impression Patient referred to PT for chronic lower back pain. Pt has a long history of fusions and injections to help minimize the pain. Pt demonstrates difficulty with transfers, walking and bed mobility today secondary to pain. He is interested in getting aquatic therapy prior to getting another fusion later in the year. Pt reports a  bulging of his abdominals when coming from supine to seated positions. He currently sleeps in a elevated position with a massager on his bed. Pt instructed on how to perform log rolls to help minimize his lower back pain. TPDN discussed with pt with pt reporting improvements with acupuncture in the past. Pt will benefit from aquatic therapy and other modalities, strengthening on land to help improve functional mobility with less noted pain.  OBJECTIVE IMPAIRMENTS: decreased activity tolerance, difficulty walking, decreased balance, decreased endurance, decreased mobility, decreased ROM, decreased strength, impaired flexibility, impaired UE/LE use, postural dysfunction, and pain.  ACTIVITY LIMITATIONS: bending, lifting, carry, locomotion, cleaning, community activity, driving, and or occupation  PERSONAL FACTORS: Arthritis, Back pain, DM, Dyspnea, Gout, Multiple joint pain, LE edema, sleep apnea are also affecting patient's functional outcome.  REHAB POTENTIAL: Good  CLINICAL DECISION MAKING: Evolving/moderate  complexity  EVALUATION COMPLEXITY: Moderate    GOALS: Short term PT Goals Target date: 09/03/2023 Pt will be I and compliant with HEP. Baseline:  Goal status: New Pt will decrease pain by 25% overall Baseline: Goal status: New  Long term PT goals Target date: 10/19/2023 Pt will improve lumbar ROM to 50% to improve functional mobility Baseline: Goal status: New Pt will improve  hip/knee strength to at least 5-/5 MMT to improve functional strength Baseline: Goal status: New Pt will improve FOTO to at least 50% functional to show improved function Baseline: Goal status: New Pt will reduce pain by overall 50% overall with usual activity Baseline: Goal status: New Pt will report improvements with bed mobility and transfers with 3-4/10 pain to improve functional mobility.  Baseline: Goal status: New Pt will be able to ambulate community distances at least 1000 ft WNL gait pattern without complaints Baseline: Goal status: New 7. Pt will be able to participate in recreational activities with 3-4/10 pain. Baseline: Goal status: New PLAN: PT FREQUENCY: 1-3 times per week   PT DURATION: 6-12 weeks  PLANNED INTERVENTIONS (unless contraindicated): aquatic PT, Canalith repositioning, cryotherapy, Electrical stimulation, Iontophoresis with 4 mg/ml dexamethasome, Moist heat, traction, Ultrasound, gait training, Therapeutic exercise, balance training, neuromuscular re-education, patient/family education, prosthetic training, manual techniques, passive ROM, dry needling, taping, vasopnuematic device, vestibular, spinal manipulations, joint manipulations  PLAN FOR NEXT SESSION: Assess HEP/update PRN, continue to progress functional mobility, strengthen proximal hip, core muscles. Start aquatics. DN?  Mayer Camel, PTA 08/17/23 5:55 PM [facility] [address] [place], [place], [address] Phone: [phone]   Fax:   [phone]

## 2023-08-19 ENCOUNTER — Ambulatory Visit: Payer: Medicare PPO

## 2023-08-19 DIAGNOSIS — R293 Abnormal posture: Secondary | ICD-10-CM

## 2023-08-19 DIAGNOSIS — R252 Cramp and spasm: Secondary | ICD-10-CM

## 2023-08-19 DIAGNOSIS — M5459 Other low back pain: Secondary | ICD-10-CM

## 2023-08-19 DIAGNOSIS — M5136 Other intervertebral disc degeneration, lumbar region with discogenic back pain only: Secondary | ICD-10-CM | POA: Diagnosis not present

## 2023-08-19 DIAGNOSIS — R2689 Other abnormalities of gait and mobility: Secondary | ICD-10-CM

## 2023-08-19 DIAGNOSIS — R262 Difficulty in walking, not elsewhere classified: Secondary | ICD-10-CM

## 2023-08-19 DIAGNOSIS — M6281 Muscle weakness (generalized): Secondary | ICD-10-CM

## 2023-08-19 NOTE — Therapy (Signed)
OUTPATIENT PHYSICAL THERAPY THORACOLUMBAR TREATMENT   Patient Name: Luke Larsen MRN: [id number] DOB:Aug 09, 1956, 67 y.o., male Today's Date: 08/19/2023  END OF SESSION:  PT End of Session - 08/19/23 1147     Visit Number 4    Number of Visits 20    Date for PT Re-Evaluation 10/19/23    Authorization Type [employer] MEDIC[employer] CHOICE PPO    Progress Note Due on Visit 10    PT Start Time 1147    PT Stop Time 1240    PT Time Calculation (min) 53 min    Activity Tolerance Patient limited by pain    Behavior During Therapy WFL for tasks assessed/performed             Past Medical History:  Diagnosis Date   Arthritis    Back pain    Constipation    Diabetes mellitus without complication (HCC)    Dyspnea    Gout    Hip pain    History of kidney stones    Hypercholesterolemia    Hypertension    Joint pain    Lower extremity edema    Neck pain    Shoulder pain    Sleep apnea    Vitamin D deficiency    Past Surgical History:  Procedure Laterality Date   ANTERIOR LAT LUMBAR FUSION Left 12/05/2020   Procedure: LEFT LATERAL INTERBODY FUSION LUMBAR 2 - LUMBAR 3 WITH INSTRUMENTATION AND ALLOGRAFT;  Surgeon: Estill Bamberg, MD;  Location: [facility];  Service: Orthopedics;  Laterality: Left;   ANTERIOR LAT LUMBAR FUSION Right 09/10/2021   Procedure: RIGHT LATERAL LUMBAR FUSION LUMBAR 3- LUMBAR 4 WITH INSTRUMENTATION AND ALLOGRAFT;  Surgeon: Estill Bamberg, MD;  Location: [facility];  Service: Orthopedics;  Laterality: Right;   BACK SURGERY     04/2019   KIDNEY STONE SURGERY  03/28/2012   Patient Active Problem List   Diagnosis Date Noted   Spinal stenosis 09/10/2021   Radiculopathy 12/05/2020   Facet arthropathy, lumbar 03/29/2019   Degenerative spondylolisthesis 03/29/2019   Chronic right-sided low back pain with right-sided sciatica 03/29/2019   Apnea, sleep 05/19/2014   Calculus of left ureter 05/19/2014   ED (erectile dysfunction) of organic origin 02/28/2014   Anxiety state  04/19/2013   Obesity, Class III, BMI 40-49.9 (morbid obesity) (HCC) 04/19/2013   Gastric ulcer by EGD 04/19/2013   History of colonic polyps 04/19/2013   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 04/19/2013   Nephrolithiasis - bilateral nonobstructive 04/19/2013   Other malaise and fatigue 03/21/2013   Weakness 03/21/2013   Numbness 03/21/2013    REFERRING PROVIDER: Laurann Montana, MD  REFERRING DIAG: Other intervertebral disc degeneration, lumbar region [M51.36]   Rationale for Evaluation and Treatment: Rehabilitation  THERAPY DIAG:  Abnormal posture  Other abnormalities of gait and mobility  Muscle weakness (generalized)  Other low back pain  Cramp and spasm  Difficulty in walking, not elsewhere classified  ONSET DATE: Ongoing  SUBJECTIVE:  SUBJECTIVE STATEMENT: Pt reports that the water exercise is more tolerable than land but he knows he needs to gain strength in the event that he needs surgery.  Pain is still quite elevated and he ambulates with antalgic gait, slow and guarded.     From initial evaluation: Pt presents with chronic lower back pain. He has a fusion L2-L3 and L3-L4 in 2022 and plans to get L1 fused in the near future. He reports pain in his lower back that radiates into his R LE to his knee. Pt states that his pain is present upon waking and progressively gets worse throughout the day. Pt received a cortisone shot earlier this week which is helping slightly. Pt has a 3/4 inch off set from L>R due to injury in football. He has insteps to offset difference but does not actively wear them.   PERTINENT HISTORY:  Arthritis, Back pain, DM, Dyspnea, Gout, Multiple joint pain, LE edema, sleep apnea.   PAIN:  Are you having pain? Yes: NPRS scale: 8.5/10 Pain location: Bilat LB, R LE Pain  description: Throbbing, stabbing when moving in wrong way, N/T into R LE.  Aggravating factors: Stooping.  Relieving factors: Laying down, sitting.   PRECAUTIONS: None  RED FLAGS: None   WEIGHT BEARING RESTRICTIONS: No  FALLS:  Has patient fallen in last 6 months? No  LIVING ENVIRONMENT: Lives with: lives with their spouse Lives in: House/apartment Stairs: Yes: Internal: 5 steps; on right going up and External: 12 steps; bilateral but cannot reach both Has following equipment at home: None  OCCUPATION: Transport planner   PLOF: Independent  PATIENT GOALS: Pt would like to reduce his lower back pain. He wants to get back to his hobbies of walking and playing golf.    OBJECTIVE:  Note: Objective measures were completed at Evaluation unless otherwise noted.  DIAGNOSTIC FINDINGS:  1. Prior PLIF at L2-3, with new posterior and interbody fusion at L3-4. Spinal stenosis at L3-4 is improved, with no more than residual mild bilateral subarticular stenosis now seen at this level. 2. 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 3. Disc bulge with facet hypertrophy at L4-5 with resultant mild bilateral lateral recess and foraminal stenosis, similar to prior. 4. Left eccentric endplate spurring and facet hypertrophy at L5-S1 with resultant mild left L5 foraminal stenosis, stable.  PATIENT SURVEYS:  FOTO 41.38, 50 predicted in 14 visits.   SCREENING FOR RED FLAGS: Bowel or bladder incontinence: No  COGNITION: Overall cognitive status: Within functional limits for tasks assessed     SENSATION: WFL  POSTURE: No Significant postural limitations  PALPATION: Tenderness to bilat paraspinals.   LUMBAR ROM:   Pt is unable to dissociate his lumbar spine from his hips. He demonstrates flexion with a hip hinge, rotation with hip rotation and lateral flexion from moving hips side to side. Pt is  able to perform extension only by bending his knees.   LOWER EXTREMITY ROM:     Active  Right eval Left eval  Hip flexion 50% 50%  Knee flexion [facility] WFL  Knee extension WFL WFL   (Blank rows = not tested)  LOWER EXTREMITY MMT:    MMT Right eval Left eval  Hip flexion 4+ 4+  Knee flexion 4+ 4+  Knee extension 4+ 4+   (Blank rows = not tested)  LUMBAR SPECIAL TESTS:  Not tested due to pain level.   FUNCTIONAL TESTS:  5 times  sit to stand: 33.24 sec  Timed up and go (TUG): 17.70 sec   GAIT: Distance walked: 39ft Assistive device utilized: None Level of assistance: Complete Independence Comments: Slow gait with decreased cadence and stride length.   TODAY'S TREATMENT:                                                                                                                              08/19/23 Nustep x 10 min level 2 Supine hook lying pelvic tilts x 20 Supine with legs on red physio ball with yoga blocks for stability, active knee to chest alternating x 20 Hooklying trunk rotation x 20 Supine hamstring stretch with strap 5 times each LE holding 10 sec each Supine Piriformis stretch 5 each side holding 10 sec each Demonstrated quad/hip flexor stretch but patient did not feel he could do this standing Trigger Point Dry-Needling  Treatment instructions: Expect mild to moderate muscle soreness. S/S of pneumothorax if dry needled over a lung field, and to seek immediate medical attention should they occur. Patient verbalized understanding of these instructions and education. Patient Consent Given: Yes Education handout provided: Yes Muscles treated: lumbar multifidi and glutes bilaterally (mainly glut min) Electrical stimulation performed: No Parameters: N/A Treatment response/outcome: Skilled palpation used to identify taut bands and trigger points.  Once identified, dry needling techniques used to treat these areas.  Mild aching response elicited..  Following  treatment, patient felt mild relief of symptoms upon standing.   Pt seen for aquatic therapy today.  Treatment took place in water 3.5-4.75 ft in depth at the [facility] pool. Temp of water was 91.  Pt entered/exited the pool via stairs, step to pattern with hand rail.  *in 84ft8" water, unsupported-> trial of noodle under arms- walking backwards/ forwards * side stepping with arms at side, limited tolerance, ->cues for smaller step length * straddling yellow noodle and noodle behind back under arms, unable to balance -> holding corner, attempted cycling - stopped, rested knees against wall *UE on noodle walking forward/ backward  * returned to relaxed squat at wall * back against wall - marching * UE on wall, attempted hips shift to neutral (not tolerated),  -> forward flexed trunk with alternating hip ext to toe touch x 5 each side    PATIENT EDUCATION:  Education details: intro to aquatic therapy  Person educated: Patient Education method: Medical illustrator Education comprehension: verbalized understanding and returned demonstration  HOME EXERCISE PROGRAM: Access Code: [email or web address] URL: [email or web address] Date: 08/06/2023 Prepared by: Royal Hawthorn  Exercises - Supine Lower Trunk Rotation  - 2 x daily - 7 x weekly - 2 sets - 10 reps - 5 hold - Supine Transversus Abdominis Bracing with Pelvic Floor Contraction  - 2 x daily - 7 x weekly - 2 sets - 10 reps - 5 hold   ASSESSMENT:  CLINICAL IMPRESSION:  Damire was able to do a very limited few exercises but did complete all tasks.  He is very tight in his hip flexors likely due to fwd bend posture and would benefit from hip flexor/quad stretching but he did not feel he could do this standing.  We discussed possibly trying prone position with strap next land visit.  He responded somewhat to dry needling.  He appeared slightly less guarded after this upon leaving clinic.  He would benefit from continued  skilled PT for LE flexibility, core strengthening, posture education, body mechanics education and pain control.     Initial Impression Patient referred to PT for chronic lower back pain. Pt has a long history of fusions and injections to help minimize the pain. Pt demonstrates difficulty with transfers, walking and bed mobility today secondary to pain. He is interested in getting aquatic therapy prior to getting another fusion later in the year. Pt reports a  bulging of his abdominals when coming from supine to seated positions. He currently sleeps in a elevated position with a massager on his bed. Pt instructed on how to perform log rolls to help minimize his lower back pain. TPDN discussed with pt with pt reporting improvements with acupuncture in the past. Pt will benefit from aquatic therapy and other modalities, strengthening on land to help improve functional mobility with less noted pain.   OBJECTIVE IMPAIRMENTS: decreased activity tolerance, difficulty walking, decreased balance, decreased endurance, decreased mobility, decreased ROM, decreased strength, impaired flexibility, impaired UE/LE use, postural dysfunction, and pain.  ACTIVITY LIMITATIONS: bending, lifting, carry, locomotion, cleaning, community activity, driving, and or occupation  PERSONAL FACTORS: Arthritis, Back pain, DM, Dyspnea, Gout, Multiple joint pain, LE edema, sleep apnea are also affecting patient's functional outcome.  REHAB POTENTIAL: Good  CLINICAL DECISION MAKING: Evolving/moderate complexity  EVALUATION COMPLEXITY: Moderate    GOALS: Short term PT Goals Target date: 09/03/2023 Pt will be I and compliant with HEP. Baseline:  Goal status: New Pt will decrease pain by 25% overall Baseline: Goal status: New  Long term PT goals Target date: 10/19/2023 Pt will improve lumbar ROM to 50% to improve functional mobility Baseline: Goal status: New Pt will improve  hip/knee strength to at least 5-/5 MMT to  improve functional strength Baseline: Goal status: New Pt will improve FOTO to at least 50% functional to show improved function Baseline: Goal status: New Pt will reduce pain by overall 50% overall with usual activity Baseline: Goal status: New Pt will report improvements with bed mobility and transfers with 3-4/10 pain to improve functional mobility.  Baseline: Goal status: New Pt will be able to ambulate community distances at least 1000 ft WNL gait pattern without complaints Baseline: Goal status: New 7. Pt will be able to participate in recreational activities with 3-4/10 pain. Baseline: Goal status: New PLAN: PT FREQUENCY: 1-3 times per week   PT DURATION: 6-12 weeks  PLANNED INTERVENTIONS (unless contraindicated): aquatic PT, Canalith repositioning, cryotherapy, Electrical stimulation, Iontophoresis with 4 mg/ml dexamethasome, Moist heat, traction, Ultrasound, gait training, Therapeutic exercise, balance training, neuromuscular re-education, patient/family education, prosthetic training, manual techniques, passive ROM, dry needling, taping, vasopnuematic device, vestibular, spinal manipulations, joint manipulations  PLAN FOR NEXT SESSION: Assess response to DN, continue to progress functional mobility, strengthen proximal hip, core muscles.   Victorino Dike B. Tylee Newby, PT 08/19/23 4:45 PM [facility] [address] [place], [place], [address] Phone: [phone]   Fax:  [phone]

## 2023-08-20 ENCOUNTER — Encounter (HOSPITAL_BASED_OUTPATIENT_CLINIC_OR_DEPARTMENT_OTHER): Payer: Medicare PPO | Admitting: Physical Therapy

## 2023-08-30 ENCOUNTER — Ambulatory Visit (HOSPITAL_BASED_OUTPATIENT_CLINIC_OR_DEPARTMENT_OTHER): Payer: Medicare PPO | Attending: Family Medicine | Admitting: Physical Therapy

## 2023-08-30 ENCOUNTER — Encounter (HOSPITAL_BASED_OUTPATIENT_CLINIC_OR_DEPARTMENT_OTHER): Payer: Self-pay | Admitting: Physical Therapy

## 2023-08-30 DIAGNOSIS — M5459 Other low back pain: Secondary | ICD-10-CM | POA: Diagnosis present

## 2023-08-30 DIAGNOSIS — R293 Abnormal posture: Secondary | ICD-10-CM

## 2023-08-30 DIAGNOSIS — R2689 Other abnormalities of gait and mobility: Secondary | ICD-10-CM | POA: Diagnosis present

## 2023-08-30 DIAGNOSIS — M6281 Muscle weakness (generalized): Secondary | ICD-10-CM

## 2023-08-30 NOTE — Therapy (Signed)
OUTPATIENT PHYSICAL THERAPY THORACOLUMBAR TREATMENT   Patient Name: Luke Larsen MRN: [id number] DOB:May 11, 1956, 67 y.o., male Today's Date: 08/30/2023  END OF SESSION:  PT End of Session - 08/30/23 1039     Visit Number 5    Number of Visits 20    Date for PT Re-Evaluation 10/19/23    Authorization Type [employer] MEDICARE CHO[employer] PPO    Progress Note Due on Visit 10    PT Start Time 1035    PT Stop Time 1115    PT Time Calculation (min) 40 min    Activity Tolerance Patient limited by pain    Behavior During Therapy WFL for tasks assessed/performed             Past Medical History:  Diagnosis Date   Arthritis    Back pain    Constipation    Diabetes mellitus without complication (HCC)    Dyspnea    Gout    Hip pain    History of kidney stones    Hypercholesterolemia    Hypertension    Joint pain    Lower extremity edema    Neck pain    Shoulder pain    Sleep apnea    Vitamin D deficiency    Past Surgical History:  Procedure Laterality Date   ANTERIOR LAT LUMBAR FUSION Left 12/05/2020   Procedure: LEFT LATERAL INTERBODY FUSION LUMBAR 2 - LUMBAR 3 WITH INSTRUMENTATION AND ALLOGRAFT;  Surgeon: Estill Bamberg, MD;  Location: [facility] OR;  Service: Orthopedics;  Laterality: Left;   ANTERIOR LAT LUMBAR FUSION Right 09/10/2021   Procedure: RIGHT LATERAL LUMBAR FUSION LUMBAR 3- LUMBAR 4 WITH INSTRUMENTATION AND ALLOGRAFT;  Surgeon: Estill Bamberg, MD;  Location: [facility] OR;  Service: Orthopedics;  Laterality: Right;   BACK SURGERY     04/2019   KIDNEY STONE SURGERY  03/28/2012   Patient Active Problem List   Diagnosis Date Noted   Spinal stenosis 09/10/2021   Radiculopathy 12/05/2020   Facet arthropathy, lumbar 03/29/2019   Degenerative spondylolisthesis 03/29/2019   Chronic right-sided low back pain with right-sided sciatica 03/29/2019   Apnea, sleep 05/19/2014   Calculus of left ureter 05/19/2014   ED (erectile dysfunction) of organic origin 02/28/2014   Anxiety state  04/19/2013   Obesity, Class III, BMI 40-49.9 (morbid obesity) (HCC) 04/19/2013   Gastric ulcer by EGD 04/19/2013   History of colonic polyps 04/19/2013   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 He continues with guared amb posture    From initial evaluation: Pt presents with chronic lower back pain. He has a fusion L2-L3 and L3-L4 in 2022 and plans to get L1 fused in the near future. He reports pain in his lower back that radiates into his R LE to his knee. Pt states that his pain is present upon waking and progressively gets worse throughout the day. Pt received a cortisone shot earlier this week which is helping slightly. Pt has a 3/4 inch off set from L>R due to injury in football. He has insteps to offset difference but does not actively wear them.   PERTINENT HISTORY:  Arthritis, Back pain, DM, Dyspnea, Gout, Multiple joint pain, LE edema, sleep apnea.   PAIN:  Are you having pain? Yes: NPRS scale: 4/10 Pain location: Bilat LB, R LE Pain description: Throbbing, stabbing when moving in wrong way, N/T into R LE.  Aggravating factors: Stooping.  Relieving factors: Laying down, sitting.   PRECAUTIONS: None  RED FLAGS: None   WEIGHT BEARING RESTRICTIONS: No  FALLS:  Has  patient fallen in last 6 months? No  LIVING ENVIRONMENT: Lives with: lives with their spouse Lives in: House/apartment Stairs: Yes: Internal: 5 steps; on right going up and External: 12 steps; bilateral but cannot reach both Has following equipment at home: None  OCCUPATION: Transport planner   PLOF: Independent  PATIENT GOALS: Pt would like to reduce his lower back pain. He wants to get back to his hobbies of walking and playing golf.    OBJECTIVE:  Note: Objective measures were completed at Evaluation unless otherwise noted.  DIAGNOSTIC FINDINGS:  1. Prior PLIF at L2-3, with new posterior and interbody fusion at L3-4. Spinal stenosis at L3-4 is improved, with no more than residual mild bilateral subarticular stenosis now seen at this level. 2. 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 3. Disc bulge with facet hypertrophy at L4-5 with resultant mild bilateral lateral recess and foraminal stenosis, similar to prior. 4. Left eccentric endplate spurring and facet hypertrophy at L5-S1 with resultant mild left L5 foraminal stenosis, stable.  PATIENT SURVEYS:  FOTO 41.38, 50 predicted in 14 visits.   SCREENING FOR RED FLAGS: Bowel or bladder incontinence: No  COGNITION: Overall cognitive status: Within functional limits for tasks assessed     SENSATION: WFL  POSTURE: No Significant postural limitations  PALPATION: Tenderness to bilat paraspinals.   LUMBAR ROM:   Pt is unable to dissociate his lumbar spine from his hips. He demonstrates flexion with a hip hinge, rotation with hip rotation and lateral flexion from moving hips side to side. Pt is able to perform extension only by bending his knees.   LOWER EXTREMITY ROM:     Active  Right eval Left eval  Hip flexion 50% 50%  Knee flexion [facility] WFL  Knee extension WFL WFL   (Blank rows = not tested)  LOWER EXTREMITY MMT:     MMT Right eval Left eval  Hip flexion 4+ 4+  Knee flexion 4+ 4+  Knee extension 4+ 4+   (Blank rows = not tested)  LUMBAR SPECIAL TESTS:  Not tested due to pain level.   FUNCTIONAL TESTS:  5 times sit to stand: 33.24 sec  Timed up and go (TUG): 17.70 sec   GAIT: Distance walked: 31ft Assistive device utilized: None Level of assistance: Complete Independence Comments: Slow  gait with decreased cadence and stride length.   TODAY'S TREATMENT:                                                                                                                              08/30/23 Pt seen for aquatic therapy today.  Treatment took place in water 3.5-4.75 ft in depth at the [facility] pool. Temp of water was 91.  Pt entered/exited the pool via stairs, step to pattern with hand rail.  *in 15ft8" water, unsupported backward * side stepping with arms at side cues for decreased step length *squated recovery position *prone suspension using 2 noodles ue support corner wall for ant core and hip stretch.  Does reports slight tightness in LB with position but no pain *quad and hip flexor stretching using noodle.  Limited slightly due to hamstring spasm *gastroc and ant tib stretching standing and rolling over solid noodle *Standing with ankle eversion and sup for peroneal/ankle stretch *walking between excises for recovery   08/19/23 Nustep x 10 min level 2 Supine hook lying pelvic tilts x 20 Supine with legs on red physio ball with yoga blocks for stability, active knee to chest alternating x 20 Hooklying trunk rotation x 20 Supine hamstring stretch with strap 5 times each LE holding 10 sec each Supine Piriformis stretch 5 each side holding 10 sec each Demonstrated quad/hip flexor stretch but patient did not feel he could do this standing Trigger Point Dry-Needling  Treatment instructions: Expect mild to moderate muscle soreness. S/S of pneumothorax if dry needled over a lung  field, and to seek immediate medical attention should they occur. Patient verbalized understanding of these instructions and education. Patient Consent Given: Yes Education handout provided: Yes Muscles treated: lumbar multifidi and glutes bilaterally (mainly glut min) Electrical stimulation performed: No Parameters: N/A Treatment response/outcome: Skilled palpation used to identify taut bands and trigger points.  Once identified, dry needling techniques used to treat these areas.  Mild aching response elicited..  Following treatment, patient felt mild relief of symptoms upon standing.      PATIENT EDUCATION:  Education details: intro to aquatic therapy  Person educated: Patient Education method: Medical illustrator Education comprehension: verbalized understanding and returned demonstration  HOME EXERCISE PROGRAM: Access Code: [email or web address] URL: [email or web address] Date: 08/06/2023 Prepared by: Royal Hawthorn  Exercises - Supine Lower Trunk Rotation  - 2 x daily - 7 x weekly - 2 sets - 10 reps - 5 hold - Supine Transversus Abdominis Bracing with Pelvic Floor Contraction  - 2 x daily - 7 x weekly - 2 sets - 10 reps - 5 hold   ASSESSMENT:  CLINICAL IMPRESSION:  Right hip abd with side stepping and/or hip abd.  He reports increase numbness with movement  to foot but without usual pain associated (numbness radiating usually just to knee). Focus on stretching hip and quad  as well ant tib and ankle which he tolerates well today with decreased pain sensitivity. He  will continue to benefit from continued skilled PT for LE flexibility, core strengthening, posture education, body mechanics education and pain control.      Initial Impression Patient referred to PT for chronic lower back pain. Pt has a long history of fusions and injections to help minimize the pain. Pt demonstrates difficulty with transfers, walking and bed mobility today secondary to pain. He is  interested in getting aquatic therapy prior to getting another fusion later in the year. Pt reports a  bulging of his abdominals when coming from supine to seated positions. He currently sleeps in a elevated position with a massager on his bed. Pt instructed on how to perform log rolls to help minimize his lower back pain. TPDN discussed with pt with pt reporting improvements with acupuncture in the past. Pt will benefit from aquatic therapy and other modalities, strengthening on land to help improve functional mobility with less noted pain.   OBJECTIVE IMPAIRMENTS: decreased activity tolerance, difficulty walking, decreased balance, decreased endurance, decreased mobility, decreased ROM, decreased strength, impaired flexibility, impaired UE/LE use, postural dysfunction, and pain.  ACTIVITY LIMITATIONS: bending, lifting, carry, locomotion, cleaning, community activity, driving, and or occupation  PERSONAL FACTORS: Arthritis, Back pain, DM, Dyspnea, Gout, Multiple joint pain, LE edema, sleep apnea are also affecting patient's functional outcome.  REHAB POTENTIAL: Good  CLINICAL DECISION MAKING: Evolving/moderate complexity  EVALUATION COMPLEXITY: Moderate    GOALS: Short term PT Goals Target date: 09/03/2023 Pt will be I and compliant with HEP. Baseline:  Goal status: New Pt will decrease pain by 25% overall Baseline: Goal status: New  Long term PT goals Target date: 10/19/2023 Pt will improve lumbar ROM to 50% to improve functional mobility Baseline: Goal status: New Pt will improve  hip/knee strength to at least 5-/5 MMT to improve functional strength Baseline: Goal status: New Pt will improve FOTO to at least 50% functional to show improved function Baseline: Goal status: New Pt will reduce pain by overall 50% overall with usual activity Baseline: Goal status: New Pt will report improvements with bed mobility and transfers with 3-4/10 pain to improve functional mobility.   Baseline: Goal status: New Pt will be able to ambulate community distances at least 1000 ft WNL gait pattern without complaints Baseline: Goal status: New 7. Pt will be able to participate in recreational activities with 3-4/10 pain. Baseline: Goal status: New PLAN: PT FREQUENCY: 1-3 times per week   PT DURATION: 6-12 weeks  PLANNED INTERVENTIONS (unless contraindicated): aquatic PT, Canalith repositioning, cryotherapy, Electrical stimulation, Iontophoresis with 4 mg/ml dexamethasome, Moist heat, traction, Ultrasound, gait training, Therapeutic exercise, balance training, neuromuscular re-education, patient/family education, prosthetic training, manual techniques, passive ROM, dry needling, taping, vasopnuematic device, vestibular, spinal manipulations, joint manipulations  PLAN FOR NEXT SESSION: Assess response to DN, continue to progress functional mobility, strengthen proximal hip, core muscles.   Corrie Dandy Sangrey) Tyashia Morrisette MPT 08/30/23 10:41 AM [facility] [address] [place], [place], [address] Phone: [phone]   Fax:  [phone]

## 2023-09-03 ENCOUNTER — Encounter (HOSPITAL_BASED_OUTPATIENT_CLINIC_OR_DEPARTMENT_OTHER): Payer: Self-pay | Admitting: Physical Therapy

## 2023-09-03 ENCOUNTER — Ambulatory Visit (HOSPITAL_BASED_OUTPATIENT_CLINIC_OR_DEPARTMENT_OTHER): Payer: Medicare PPO | Admitting: Physical Therapy

## 2023-09-03 DIAGNOSIS — R2689 Other abnormalities of gait and mobility: Secondary | ICD-10-CM

## 2023-09-03 DIAGNOSIS — R293 Abnormal posture: Secondary | ICD-10-CM

## 2023-09-03 DIAGNOSIS — M6281 Muscle weakness (generalized): Secondary | ICD-10-CM

## 2023-09-03 NOTE — Therapy (Signed)
OUTPATIENT PHYSICAL THERAPY THORACOLUMBAR TREATMENT   Patient Name: Luke Larsen MRN: [id number] DOB:07/20/1956, 67 y.o., male Today's Date: 09/03/2023  END OF SESSION:  PT End of Session - 09/03/23 1151     Visit Number 6    Number of Visits 20    Date for PT Re-Evaluation 10/19/23    Authorization Type [employer] MEDICARE CHOICE PPO    Progress Note Due on Visit 10    PT Start Time 1116    PT Stop Time 1200    PT Time Calculation (min) 44 min    Activity Tolerance Patient limited by pain    Behavior During Therapy WFL for tasks assessed/performed              Past Medical History:  Diagnosis Date   Arthritis    Back pain    Constipation    Diabetes mellitus without complication (HCC)    Dyspnea    Gout    Hip pain    History of kidney stones    Hypercholesterolemia    Hypertension    Joint pain    Lower extremity edema    Neck pain    Shoulder pain    Sleep apnea    Vitamin D deficiency    Past Surgical History:  Procedure Laterality Date   ANTERIOR LAT LUMBAR FUSION Left 12/05/2020   Procedure: LEFT LATERAL INTERBODY FUSION LUMBAR 2 - LUMBAR 3 WITH INSTRUMENTATION AND ALLOGRAFT;  Surgeon: Estill Bamberg, MD;  Location: [facility] OR;  Service: Orthopedics;  Laterality: Left;   ANTERIOR LAT LUMBAR FUSION Right 09/10/2021   Procedure: RIGHT LATERAL LUMBAR FUSION LUMBAR 3- LUMBAR 4 WITH INSTRUMENTATION AND ALLOGRAFT;  Surgeon: Estill Bamberg, MD;  Location: [facility] OR;  Service: Orthopedics;  Laterality: Right;   BACK SURGERY     04/2019   KIDNEY STONE SURGERY  03/28/2012   Patient Active Problem List   Diagnosis Date Noted   Spinal stenosis 09/10/2021   Radiculopathy 12/05/2020   Facet arthropathy, lumbar 03/29/2019   Degenerative spondylolisthesis 03/29/2019   Chronic right-sided low back pain with right-sided sciatica 03/29/2019   Apnea, sleep 05/19/2014   Calculus of left ureter 05/19/2014   ED (erectile dysfunction) of organic origin 02/28/2014   Anxiety state  04/19/2013   Obesity, Class III, BMI 40-49.9 (morbid obesity) (HCC) 04/19/2013   Gastric ulcer by EGD 04/19/2013   History of colonic polyps 04/19/2013   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 04/19/2013   Nephrolithiasis - bilateral nonobstructive 04/19/2013   Other malaise and fatigue 03/21/2013   Weakness 03/21/2013   Numbness 03/21/2013    REFERRING PROVIDER: Laurann Montana, MD  REFERRING DIAG: Other intervertebral disc degeneration, lumbar region [M51.36]   Rationale for Evaluation and Treatment: Rehabilitation  THERAPY DIAG:  Abnormal posture  Other abnormalities of gait and mobility  Muscle weakness (generalized)  ONSET DATE: Ongoing  SUBJECTIVE:  SUBJECTIVE STATEMENT: Pt reports increase in pain due to activities yesterday    From initial evaluation: Pt presents with chronic lower back pain. He has a fusion L2-L3 and L3-L4 in 2022 and plans to get L1 fused in the near future. He reports pain in his lower back that radiates into his R LE to his knee. Pt states that his pain is present upon waking and progressively gets worse throughout the day. Pt received a cortisone shot earlier this week which is helping slightly. Pt has a 3/4 inch off set from L>R due to injury in football. He has insteps to offset difference but does not actively wear them.   PERTINENT HISTORY:  Arthritis, Back pain, DM, Dyspnea, Gout, Multiple joint pain, LE edema, sleep apnea.   PAIN:  Are you having pain? Yes: NPRS scale: 4/10 Pain location: Bilat LB, R LE Pain description: Throbbing, stabbing when moving in wrong way, N/T into R LE.  Aggravating factors: Stooping.  Relieving factors: Laying down, sitting.   PRECAUTIONS: None  RED FLAGS: None   WEIGHT BEARING RESTRICTIONS: No  FALLS:  Has patient fallen in  last 6 months? No  LIVING ENVIRONMENT: Lives with: lives with their spouse Lives in: House/apartment Stairs: Yes: Internal: 5 steps; on right going up and External: 12 steps; bilateral but cannot reach both Has following equipment at home: None  OCCUPATION: Transport planner   PLOF: Independent  PATIENT GOALS: Pt would like to reduce his lower back pain. He wants to get back to his hobbies of walking and playing golf.    OBJECTIVE:  Note: Objective measures were completed at Evaluation unless otherwise noted.  DIAGNOSTIC FINDINGS:  1. Prior PLIF at L2-3, with new posterior and interbody fusion at L3-4. Spinal stenosis at L3-4 is improved, with no more than residual mild bilateral subarticular stenosis now seen at this level. 2. 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 3. Disc bulge with facet hypertrophy at L4-5 with resultant mild bilateral lateral recess and foraminal stenosis, similar to prior. 4. Left eccentric endplate spurring and facet hypertrophy at L5-S1 with resultant mild left L5 foraminal stenosis, stable.  PATIENT SURVEYS:  FOTO 41.38, 50 predicted in 14 visits.   SCREENING FOR RED FLAGS: Bowel or bladder incontinence: No  COGNITION: Overall cognitive status: Within functional limits for tasks assessed     SENSATION: WFL  POSTURE: No Significant postural limitations  PALPATION: Tenderness to bilat paraspinals.   LUMBAR ROM:   Pt is unable to dissociate his lumbar spine from his hips. He demonstrates flexion with a hip hinge, rotation with hip rotation and lateral flexion from moving hips side to side. Pt is able to perform extension only by bending his knees.   LOWER EXTREMITY ROM:     Active  Right eval Left eval  Hip flexion 50% 50%  Knee flexion [facility] WFL  Knee extension WFL WFL   (Blank rows = not tested)  LOWER EXTREMITY MMT:    MMT Right eval  Left eval  Hip flexion 4+ 4+  Knee flexion 4+ 4+  Knee extension 4+ 4+   (Blank rows = not tested)  LUMBAR SPECIAL TESTS:  Not tested due to pain level.   FUNCTIONAL TESTS:  5 times sit to stand: 33.24 sec  Timed up and go (TUG): 17.70 sec   GAIT: Distance walked: 45ft Assistive device utilized: None Level of assistance: Complete Independence Comments: Slow gait with decreased  cadence and stride length.   TODAY'S TREATMENT:                                                                                                                              09/03/23 Pt seen for aquatic therapy today.  Treatment took place in water 3.5-4.75 ft in depth at the [facility] pool. Temp of water was 91.  Pt entered/exited the pool via stairs, step to pattern with hand rail.  *squated recovery position *in 33ft8" water, unsupported backward *squated recovery position * side stepping with arms at side cues for decreased step length *prone suspension using 2 noodles ue support corner wall for ant core and hip stretch.  *BKTC at ladder working feet onto bottom step: LB and then hamstring/gastroc stretch *decompression   08/30/23 Pt seen for aquatic therapy today.  Treatment took place in water 3.5-4.75 ft in depth at the [facility] pool. Temp of water was 91.  Pt entered/exited the pool via stairs, step to pattern with hand rail.  *in 2ft8" water, unsupported backward * side stepping with arms at side cues for decreased step length *squated recovery position *prone suspension using 2 noodles ue support corner wall for ant core and hip stretch.  Does reports slight tightness in LB with position but no pain *quad and hip flexor stretching using noodle.  Limited slightly due to hamstring spasm *gastroc and ant tib stretching standing and rolling over solid noodle *Standing with ankle eversion and sup for peroneal/ankle stretch *walking between excises for  recovery   08/19/23 Nustep x 10 min level 2 Supine hook lying pelvic tilts x 20 Supine with legs on red physio ball with yoga blocks for stability, active knee to chest alternating x 20 Hooklying trunk rotation x 20 Supine hamstring stretch with strap 5 times each LE holding 10 sec each Supine Piriformis stretch 5 each side holding 10 sec each Demonstrated quad/hip flexor stretch but patient did not feel he could do this standing Trigger Point Dry-Needling  Treatment instructions: Expect mild to moderate muscle soreness. S/S of pneumothorax if dry needled over a lung field, and to seek immediate medical attention should they occur. Patient verbalized understanding of these instructions and education. Patient Consent Given: Yes Education handout provided: Yes Muscles treated: lumbar multifidi and glutes bilaterally (mainly glut min) Electrical stimulation performed: No Parameters: N/A Treatment response/outcome: Skilled palpation used to identify taut bands and trigger points.  Once identified, dry needling techniques used to treat these areas.  Mild aching response elicited..  Following treatment, patient felt mild relief of symptoms upon standing.      PATIENT EDUCATION:  Education details: intro to aquatic therapy  Person educated: Patient Education method: Medical illustrator Education comprehension: verbalized understanding and returned demonstration  HOME EXERCISE PROGRAM: Access Code: [email or web address] URL: [email or web address] Date: 08/06/2023 Prepared by: Royal Hawthorn  Exercises - Supine Lower Trunk Rotation  - 2 x daily - 7 x weekly - 2 sets -  10 reps - 5 hold - Supine Transversus Abdominis Bracing with Pelvic Floor Contraction  - 2 x daily - 7 x weekly - 2 sets - 10 reps - 5 hold   ASSESSMENT:  CLINICAL IMPRESSION:  Elevated pain sensitivity after riding long hours in car and visiting brother in hospital yesterday. Pt reports pain so high causing  brain fog last night.  Has an appointment scheduled for a consult to determine type of surgical procedure that will be necessary.  Pt frustrated with delay in scheduling.  He is unable to tolerate much muscle activation today so we focused on stretching of core and decompression for pain reduction.  Discussed gaining access to pool so to be able to improve pain management using the properties of water which is working well for him/better than any other intervention.  Goals ongoing.        Initial Impression Patient referred to PT for chronic lower back pain. Pt has a long history of fusions and injections to help minimize the pain. Pt demonstrates difficulty with transfers, walking and bed mobility today secondary to pain. He is interested in getting aquatic therapy prior to getting another fusion later in the year. Pt reports a  bulging of his abdominals when coming from supine to seated positions. He currently sleeps in a elevated position with a massager on his bed. Pt instructed on how to perform log rolls to help minimize his lower back pain. TPDN discussed with pt with pt reporting improvements with acupuncture in the past. Pt will benefit from aquatic therapy and other modalities, strengthening on land to help improve functional mobility with less noted pain.   OBJECTIVE IMPAIRMENTS: decreased activity tolerance, difficulty walking, decreased balance, decreased endurance, decreased mobility, decreased ROM, decreased strength, impaired flexibility, impaired UE/LE use, postural dysfunction, and pain.  ACTIVITY LIMITATIONS: bending, lifting, carry, locomotion, cleaning, community activity, driving, and or occupation  PERSONAL FACTORS: Arthritis, Back pain, DM, Dyspnea, Gout, Multiple joint pain, LE edema, sleep apnea are also affecting patient's functional outcome.  REHAB POTENTIAL: Good  CLINICAL DECISION MAKING: Evolving/moderate complexity  EVALUATION COMPLEXITY:  Moderate    GOALS: Short term PT Goals Target date: 09/03/2023 Pt will be I and compliant with HEP. Baseline:  Goal status: New Pt will decrease pain by 25% overall Baseline: Goal status: New  Long term PT goals Target date: 10/19/2023 Pt will improve lumbar ROM to 50% to improve functional mobility Baseline: Goal status: New Pt will improve  hip/knee strength to at least 5-/5 MMT to improve functional strength Baseline: Goal status: New Pt will improve FOTO to at least 50% functional to show improved function Baseline: Goal status: New Pt will reduce pain by overall 50% overall with usual activity Baseline: Goal status: New Pt will report improvements with bed mobility and transfers with 3-4/10 pain to improve functional mobility.  Baseline: Goal status: New Pt will be able to ambulate community distances at least 1000 ft WNL gait pattern without complaints Baseline: Goal status: New 7. Pt will be able to participate in recreational activities with 3-4/10 pain. Baseline: Goal status: New PLAN: PT FREQUENCY: 1-3 times per week   PT DURATION: 6-12 weeks  PLANNED INTERVENTIONS (unless contraindicated): aquatic PT, Canalith repositioning, cryotherapy, Electrical stimulation, Iontophoresis with 4 mg/ml dexamethasome, Moist heat, traction, Ultrasound, gait training, Therapeutic exercise, balance training, neuromuscular re-education, patient/family education, prosthetic training, manual techniques, passive ROM, dry needling, taping, vasopnuematic device, vestibular, spinal manipulations, joint manipulations  PLAN FOR NEXT SESSION: Assess response to DN, continue to  progress functional mobility, strengthen proximal hip, core muscles.   Corrie Dandy [place]) Senia Even MPT 09/03/23 11:53 AM [facility] [address] [place], [place], [address] Phone: [phone]   Fax:  [phone]

## 2023-09-08 ENCOUNTER — Encounter (HOSPITAL_BASED_OUTPATIENT_CLINIC_OR_DEPARTMENT_OTHER): Payer: Self-pay | Admitting: Physical Therapy

## 2023-09-08 ENCOUNTER — Ambulatory Visit (HOSPITAL_BASED_OUTPATIENT_CLINIC_OR_DEPARTMENT_OTHER): Payer: Medicare PPO | Admitting: Physical Therapy

## 2023-09-08 DIAGNOSIS — M6281 Muscle weakness (generalized): Secondary | ICD-10-CM

## 2023-09-08 DIAGNOSIS — R2689 Other abnormalities of gait and mobility: Secondary | ICD-10-CM

## 2023-09-08 DIAGNOSIS — R293 Abnormal posture: Secondary | ICD-10-CM

## 2023-09-08 NOTE — Therapy (Signed)
OUTPATIENT PHYSICAL THERAPY THORACOLUMBAR TREATMENT   Patient Name: Luke Larsen MRN: [id number] DOB:November 29, 1955, 67 y.o., male Today's Date: 09/08/2023  END OF SESSION:  PT End of Session - 09/08/23 1035     Visit Number 7    Number of Visits 20    Date for PT Re-Evaluation 10/19/23    Authorization Type [employer] MEDICARE CHOICE PPO    Progress Note Due on Visit 10    PT Start Time 1035    PT Stop Time 1115    PT Time Calculation (min) 40 min    Activity Tolerance Patient limited by pain    Behavior During Therapy WFL for tasks assessed/performed              Past Medical History:  Diagnosis Date   Arthritis    Back pain    Constipation    Diabetes mellitus without complication (HCC)    Dyspnea    Gout    Hip pain    History of kidney stones    Hypercholesterolemia    Hypertension    Joint pain    Lower extremity edema    Neck pain    Shoulder pain    Sleep apnea    Vitamin D deficiency    Past Surgical History:  Procedure Laterality Date   ANTERIOR LAT LUMBAR FUSION Left 12/05/2020   Procedure: LEFT LATERAL INTERBODY FUSION LUMBAR 2 - LUMBAR 3 WITH INSTRUMENTATION AND ALLOGRAFT;  Surgeon: Estill Bamberg, MD;  Location: [facility] OR;  Service: Orthopedics;  Laterality: Left;   ANTERIOR LAT LUMBAR FUSION Right 09/10/2021   Procedure: RIGHT LATERAL LUMBAR FUSION LUMBAR 3- LUMBAR 4 WITH INSTRUMENTATION AND ALLOGRAFT;  Surgeon: Estill Bamberg, MD;  Location: [facility] OR;  Service: Orthopedics;  Laterality: Right;   BACK SURGERY     04/2019   KIDNEY STONE SURGERY  03/28/2012   Patient Active Problem List   Diagnosis Date Noted   Spinal stenosis 09/10/2021   Radiculopathy 12/05/2020   Facet arthropathy, lumbar 03/29/2019   Degenerative spondylolisthesis 03/29/2019   Chronic right-sided low back pain with right-sided sciatica 03/29/2019   Apnea, sleep 05/19/2014   Calculus of left ureter 05/19/2014   ED (erectile dysfunction) of organic origin 02/28/2014   Anxiety state  04/19/2013   Obesity, Class III, BMI 40-49.9 (morbid obesity) (HCC) 04/19/2013   Gastric ulcer by EGD 04/19/2013   History of colonic polyps 04/19/2013   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  MD has declined further surgical intervention until pt loses 50 lbs.  Pt does not believe he wil be able to wait that long duento his high pain levels.    From initial evaluation: Pt presents with chronic lower back pain. He has a fusion L2-L3 and L3-L4 in 2022 and plans to get L1 fused in the near future. He reports pain in his lower back that radiates into his R LE to his knee. Pt states that his pain is present upon waking and progressively gets worse throughout the day. Pt received a cortisone shot earlier this week which is helping slightly. Pt has a 3/4 inch off set from L>R due to injury in football. He has insteps to offset difference but does not actively wear them.   PERTINENT HISTORY:  Arthritis, Back pain, DM, Dyspnea, Gout, Multiple joint pain, LE edema, sleep apnea.   PAIN:  Are you having pain? Yes: NPRS scale: 6/10 Pain location: Bilat LB, R LE Pain description: Throbbing, stabbing when moving in wrong way, N/T into R LE.  Aggravating factors: Stooping.  Relieving factors: Laying  down, sitting.   PRECAUTIONS: None  RED FLAGS: None   WEIGHT BEARING RESTRICTIONS: No  FALLS:  Has patient fallen in last 6 months? No  LIVING ENVIRONMENT: Lives with: lives with their spouse Lives in: House/apartment Stairs: Yes: Internal: 5 steps; on right going up and External: 12 steps; bilateral but cannot reach both Has following equipment at home: None  OCCUPATION: Transport planner   PLOF: Independent  PATIENT GOALS: Pt would like to reduce his lower back pain. He wants to get back to his hobbies of walking and playing golf.    OBJECTIVE:  Note: Objective measures were completed at Evaluation unless otherwise noted.  DIAGNOSTIC FINDINGS:  1. Prior PLIF at L2-3, with new posterior and interbody fusion at L3-4. Spinal stenosis at L3-4 is improved, with no more than residual mild bilateral subarticular stenosis now seen at this level. 2. 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 3. Disc bulge with facet hypertrophy at L4-5 with resultant mild bilateral lateral recess and foraminal stenosis, similar to prior. 4. Left eccentric endplate spurring and facet hypertrophy at L5-S1 with resultant mild left L5 foraminal stenosis, stable.  PATIENT SURVEYS:  FOTO 41.38, 50 predicted in 14 visits.   SCREENING FOR RED FLAGS: Bowel or bladder incontinence: No  COGNITION: Overall cognitive status: Within functional limits for tasks assessed     SENSATION: WFL  POSTURE: No Significant postural limitations  PALPATION: Tenderness to bilat paraspinals.   LUMBAR ROM:   Pt is unable to dissociate his lumbar spine from his hips. He demonstrates flexion with a hip hinge, rotation with hip rotation and lateral flexion from moving hips side to side. Pt is able to perform extension only by bending his knees.   LOWER EXTREMITY ROM:     Active  Right eval Left eval  Hip flexion 50%  50%  Knee flexion [facility] WFL  Knee extension WFL WFL   (Blank rows = not tested)  LOWER EXTREMITY MMT:    MMT Right eval Left eval  Hip flexion 4+ 4+  Knee flexion 4+ 4+  Knee extension 4+ 4+   (Blank rows = not tested)  LUMBAR SPECIAL TESTS:  Not tested due to pain level.   FUNCTIONAL TESTS:  5 times sit to stand: 33.24 sec  Timed up  and go (TUG): 17.70 sec   GAIT: Distance walked: 38ft Assistive device utilized: None Level of assistance: Complete Independence Comments: Slow gait with decreased cadence and stride length.   TODAY'S TREATMENT:                                                                                                                              09/03/23 Pt seen for aquatic therapy today.  Treatment took place in water 3.5-4.75 ft in depth at the [facility] pool. Temp of water was 91.  Pt entered/exited the pool via stairs, step to pattern with hand rail.  *squated recovery position *in 41ft8" water, unsupported backward *squated recovery position * side stepping with arms at side cues for decreased step length *prone suspension using 2 noodles ue support corner wall for ant core and hip stretch.  *BKTC at ladder working feet onto bottom step: LB and then hamstring/gastroc stretch *decompression   08/30/23 Pt seen for aquatic therapy today.  Treatment took place in water 3.5-4.75 ft in depth at the [facility] pool. Temp of water was 91.  Pt entered/exited the pool via stairs, step to pattern with hand rail.  *in 80ft8" water, unsupported backward * side stepping with arms at side cues for decreased step length *squated recovery position *prone suspension using 2 noodles ue support corner wall for ant core and hip stretch.  Does reports slight tightness in LB with position but no pain *quad and hip flexor stretching using noodle.  Limited slightly due to hamstring spasm *gastroc and ant tib stretching standing and rolling over solid  noodle *Standing with ankle eversion and sup for peroneal/ankle stretch *walking between excises for recovery   08/19/23 Nustep x 10 min level 2 Supine hook lying pelvic tilts x 20 Supine with legs on red physio ball with yoga blocks for stability, active knee to chest alternating x 20 Hooklying trunk rotation x 20 Supine hamstring stretch with strap 5 times each LE holding 10 sec each Supine Piriformis stretch 5 each side holding 10 sec each Demonstrated quad/hip flexor stretch but patient did not feel he could do this standing Trigger Point Dry-Needling  Treatment instructions: Expect mild to moderate muscle soreness. S/S of pneumothorax if dry needled over a lung field, and to seek immediate medical attention should they occur. Patient verbalized understanding of these instructions and education. Patient Consent Given: Yes Education handout provided: Yes Muscles treated: lumbar multifidi and glutes bilaterally (mainly glut min) Electrical stimulation performed: No Parameters: N/A Treatment response/outcome: Skilled palpation used to identify taut bands and trigger points.  Once identified, dry needling techniques used to treat these areas.  Mild aching response elicited..  Following treatment, patient felt mild relief of symptoms upon standing.      PATIENT EDUCATION:  Education details: intro to aquatic therapy  Person educated: Patient Education method: Medical illustrator Education comprehension: verbalized understanding and returned demonstration  HOME EXERCISE PROGRAM: Access Code: [email or web address] URL: [email or web address]  Date: 08/06/2023 Prepared by: Royal Hawthorn  Exercises - Supine Lower Trunk Rotation  - 2 x daily - 7 x weekly - 2 sets - 10 reps - 5 hold - Supine Transversus Abdominis Bracing with Pelvic Floor Contraction  - 2 x daily - 7 x weekly - 2 sets - 10 reps - 5 hold  Access Code: [email or web address] URL: [email or web address] Date:  09/08/2023 Prepared by: Geni Bers This aquatic home exercise program from Med[employer] utilizes pictures from land based exercises, but has been adapted prior to lamination and issuance.   Exercises - Walking  - Hand Buoy Carry  - Side Stepping  - Noodle press  - 1 x daily - 1-3 x weekly - 1-2 sets - 10 reps - Standing Shoulder Horizontal Abduction with Resistance  - 1 x daily - 1-3 x weekly - 1-2 sets - 10 reps - Seated Double Knee to Chest  - 1-3 x weekly - Static Prone on Elbows  - 1-3 x weekly Not issued  ASSESSMENT:  CLINICAL IMPRESSION:  Some decrease in pain sensitivity since last session.  He reports plan to gain pool access later today or tomorrow.  He is directed through activities with focus on pain relief and stretching.  MD has decided to delay surgery until pt loses weight. Pt is considering another opinion as he does not feel he will be able to wait due to level of pain he is experiencing.  Pt not tolerating progression of aquatic exercises.  Plan is to instruct pt on HEP which is created today, instruct and issue then DC until pt has intervention to better control pain which will allow for progression with therapy.        Initial Impression Patient referred to PT for chronic lower back pain. Pt has a long history of fusions and injections to help minimize the pain. Pt demonstrates difficulty with transfers, walking and bed mobility today secondary to pain. He is interested in getting aquatic therapy prior to getting another fusion later in the year. Pt reports a  bulging of his abdominals when coming from supine to seated positions. He currently sleeps in a elevated position with a massager on his bed. Pt instructed on how to perform log rolls to help minimize his lower back pain. TPDN discussed with pt with pt reporting improvements with acupuncture in the past. Pt will benefit from aquatic therapy and other modalities, strengthening on land to help improve functional  mobility with less noted pain.   OBJECTIVE IMPAIRMENTS: decreased activity tolerance, difficulty walking, decreased balance, decreased endurance, decreased mobility, decreased ROM, decreased strength, impaired flexibility, impaired UE/LE use, postural dysfunction, and pain.  ACTIVITY LIMITATIONS: bending, lifting, carry, locomotion, cleaning, community activity, driving, and or occupation  PERSONAL FACTORS: Arthritis, Back pain, DM, Dyspnea, Gout, Multiple joint pain, LE edema, sleep apnea are also affecting patient's functional outcome.  REHAB POTENTIAL: Good  CLINICAL DECISION MAKING: Evolving/moderate complexity  EVALUATION COMPLEXITY: Moderate    GOALS: Short term PT Goals Target date: 09/03/2023 Pt will be I and compliant with HEP. Baseline:  Goal status: New Pt will decrease pain by 25% overall Baseline: Goal status: New  Long term PT goals Target date: 10/19/2023 Pt will improve lumbar ROM to 50% to improve functional mobility Baseline: Goal status: New Pt will improve  hip/knee strength to at least 5-/5 MMT to improve functional strength Baseline: Goal status: New Pt will improve FOTO to at least 50% functional to show improved function Baseline: Goal status: New Pt will reduce pain  by overall 50% overall with usual activity Baseline: Goal status: New Pt will report improvements with bed mobility and transfers with 3-4/10 pain to improve functional mobility.  Baseline: Goal status: New Pt will be able to ambulate community distances at least 1000 ft WNL gait pattern without complaints Baseline: Goal status: New 7. Pt will be able to participate in recreational activities with 3-4/10 pain. Baseline: Goal status: New PLAN: PT FREQUENCY: 1-3 times per week   PT DURATION: 6-12 weeks  PLANNED INTERVENTIONS (unless contraindicated): aquatic PT, Canalith repositioning, cryotherapy, Electrical stimulation, Iontophoresis with 4 mg/ml dexamethasome, Moist heat,  traction, Ultrasound, gait training, Therapeutic exercise, balance training, neuromuscular re-education, patient/family education, prosthetic training, manual techniques, passive ROM, dry needling, taping, vasopnuematic device, vestibular, spinal manipulations, joint manipulations  PLAN FOR NEXT SESSION: Assess response to DN, continue to progress functional mobility, strengthen proximal hip, core muscles.   Corrie Dandy [place]) Shaquanda Graves MPT 09/08/23 12:27 PM [facility] [address] [place], [place], [address] Phone: [phone]   Fax:  [phone]

## 2023-09-10 ENCOUNTER — Ambulatory Visit (HOSPITAL_BASED_OUTPATIENT_CLINIC_OR_DEPARTMENT_OTHER): Payer: Medicare PPO | Admitting: Physical Therapy

## 2023-09-13 ENCOUNTER — Ambulatory Visit (HOSPITAL_BASED_OUTPATIENT_CLINIC_OR_DEPARTMENT_OTHER): Payer: Medicare PPO | Admitting: Physical Therapy

## 2023-09-13 DIAGNOSIS — R293 Abnormal posture: Secondary | ICD-10-CM | POA: Diagnosis not present

## 2023-09-13 DIAGNOSIS — R2689 Other abnormalities of gait and mobility: Secondary | ICD-10-CM

## 2023-09-13 DIAGNOSIS — M6281 Muscle weakness (generalized): Secondary | ICD-10-CM

## 2023-09-13 DIAGNOSIS — M5459 Other low back pain: Secondary | ICD-10-CM

## 2023-09-13 NOTE — Therapy (Addendum)
OUTPATIENT PHYSICAL THERAPY THORACOLUMBAR TREATMENT  PHYSICAL THERAPY DISCHARGE SUMMARY  Visits from Start of Care: 8  Current functional level related to goals / functional outcomes: Occasional assistance needed   Remaining deficits: Chronic pain   Education / Equipment: Management of condition/aquatic HEP   Patient agrees to discharge. Patient goals were partially met. Patient is being discharged due to the patient's request.  Addend Corrie Dandy Tomma Lightning) Ziemba MPT 09/21/23 2:14 PM [facility] [address] [place], [place], [address] Phone: [phone]   Fax:  [phone]   Patient Name: Luke Larsen MRN: [id number] DOB:[birth year], 67 y.o., male Today's Date: 09/13/2023  END OF SESSION:  PT End of Session - 09/13/23 1526     Visit Number 8    Number of Visits 20    Date for PT Re-Evaluation 10/19/23    Authorization Type [employer] MEDICARE CHOICE PPO    Progress Note Due on Visit 10    PT Start Time 1430    PT Stop Time 1505    PT Time Calculation (min) 35 min    Activity Tolerance Patient limited by pain               Past Medical History:  Diagnosis Date   Arthritis    Back pain    Constipation    Diabetes mellitus without complication (HCC)    Dyspnea    Gout    Hip pain    History of kidney stones    Hypercholesterolemia    Hypertension    Joint pain    Lower extremity edema    Neck pain    Shoulder pain    Sleep apnea    Vitamin D deficiency    Past Surgical History:  Procedure Laterality Date   ANTERIOR LAT LUMBAR FUSION Left 12/05/2020   Procedure: LEFT LATERAL INTERBODY FUSION LUMBAR 2 - LUMBAR 3 WITH INSTRUMENTATION AND ALLOGRAFT;  Surgeon: Estill Bamberg, MD;  Location: [facility] OR;  Service: Orthopedics;  Laterality: Left;   ANTERIOR LAT LUMBAR FUSION Right 09/10/2021   Procedure: RIGHT LATERAL LUMBAR FUSION LUMBAR 3- LUMBAR 4 WITH INSTRUMENTATION AND ALLOGRAFT;  Surgeon: Estill Bamberg, MD;   Location: [facility] OR;  Service: Orthopedics;  Laterality: Right;   BACK SURGERY     04/2019   KIDNEY STONE SURGERY  03/28/2012   Patient Active Problem List   Diagnosis Date Noted   Spinal stenosis 09/10/2021   Radiculopathy 12/05/2020   Facet arthropathy, lumbar 03/29/2019   Degenerative spondylolisthesis 03/29/2019   Chronic right-sided low back pain with right-sided sciatica 03/29/2019   Apnea, sleep 05/19/2014   Calculus of left ureter 05/19/2014   ED (erectile dysfunction) of organic origin 02/28/2014   Anxiety state 04/19/2013   Obesity, Class III, BMI 40-49.9 (morbid obesity) (HCC) 04/19/2013   Gastric ulcer by EGD 04/19/2013   History of colonic polyps 04/19/2013   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  MD has declined further surgical intervention until pt loses 50 lbs. Pt has decided to join [employer] (likely at first of year) and plans to continue getting in water for pain relief. Pt requests to d/c at end of visit today.  He reports that today is a good day, despite a lot of driving this weekend for funeral in [place], along with a lot of standing.   From initial evaluation: Pt presents with chronic lower back pain. He has a fusion L2-L3 and L3-L4 in 2022 and  plans to get L1 fused in the near future. He reports pain in his lower back that radiates into his R LE to his knee. Pt states that his pain is present upon waking and progressively gets worse throughout the day. Pt received a cortisone shot earlier this week which is helping slightly. Pt has a 3/4 inch off set from L>R due to injury in football. He has insteps to offset difference but does not actively wear them.   PERTINENT HISTORY:  Arthritis, Back pain, DM, Dyspnea, Gout, Multiple joint pain, LE edema, sleep apnea.   PAIN:  Are you having pain? Yes: NPRS scale: 4/10 Pain location: Bilat LB, R LE Pain description: Throbbing, stabbing when moving in wrong way, N/T into R LE.  Aggravating factors: Stooping.  Relieving factors: Laying down, sitting.   PRECAUTIONS: None  RED FLAGS: None   WEIGHT BEARING RESTRICTIONS: No  FALLS:  Has patient fallen in last 6 months? No  LIVING ENVIRONMENT: Lives with: lives with their spouse Lives in: House/apartment Stairs: Yes: Internal: 5 steps; on right going up and External: 12 steps; bilateral but cannot reach both Has following equipment at home: None  OCCUPATION: Transport planner   PLOF: Independent  PATIENT GOALS: Pt would like to reduce his lower back pain. He wants to get back to his hobbies of walking and playing golf.    OBJECTIVE:  Note: Objective measures were completed at Evaluation unless otherwise noted.  DIAGNOSTIC FINDINGS:  1. Prior PLIF at L2-3, with new posterior and interbody fusion at L3-4. Spinal stenosis at L3-4 is improved, with no more than residual mild bilateral subarticular stenosis now seen at this level. 2. 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 3. Disc bulge with facet hypertrophy at L4-5 with resultant mild bilateral lateral recess and foraminal stenosis, similar to prior. 4. Left eccentric endplate spurring  and facet hypertrophy at L5-S1 with resultant mild left L5 foraminal stenosis, stable.  PATIENT SURVEYS:  FOTO 41.38, 50 predicted in 14 visits.   09/13/23:  45%   SCREENING FOR RED FLAGS: Bowel or bladder incontinence: No  COGNITION: Overall cognitive status: Within functional limits for tasks assessed     SENSATION: WFL  POSTURE: No Significant postural limitations  PALPATION: Tenderness to bilat paraspinals.   LUMBAR ROM:   Pt is unable to dissociate his lumbar spine from his hips. He demonstrates flexion with a hip hinge, rotation with hip rotation and lateral flexion from moving hips side to side. Pt is able to perform extension only by bending his knees.   LOWER EXTREMITY ROM:     Active  Right eval Left eval  Hip flexion 50% 50%  Knee flexion [facility] WFL  Knee extension WFL WFL   (Blank rows = not tested)  LOWER EXTREMITY MMT:    MMT Right eval Left eval  Hip flexion 4+ 4+  Knee flexion 4+ 4+  Knee extension 4+ 4+   (Blank rows = not tested)  LUMBAR SPECIAL TESTS:  Not tested due to pain level.   FUNCTIONAL TESTS:  5 times sit to stand: 33.24 sec  Timed up and go (TUG): 17.70 sec   GAIT: Distance walked: 3ft Assistive device utilized: None Level of assistance: Complete Independence Comments: Slow gait with decreased cadence and stride length.   TODAY'S TREATMENT:                                                                                                                              11/55/24 Pt seen for aquatic therapy today.  Treatment took place in water 3.5-4.75 ft in depth at the [facility] pool. Temp of water was 91.  Pt entered/exited the pool via stairs, step to pattern with hand rail.  * knees to chest stretch -> hamstring stretch with hands on rails and feet in bottom ladder hole x 4 rounds *in 30ft8" water with blue hand floats under water at sides, unsupported backward walking/ forward walking  - multiple laps;  side stepping  x 4 laps  *  side  stepping with horiz abdct/ addct with blue hand floats, multiple laps  * TrA set with rainbow -> blue hand float pull down to thighs x 20 * UE on wall: alternating hip ext to toe touch x 8 each *prone suspension using 2 noodles ue support corner wall for ant core and hip stretch; trial at bench (not tolerated)  * vertical suspension with noodles under arms (wrapped ant/ post)    09/03/23 Pt seen for aquatic therapy today.  Treatment took place in water 3.5-4.75 ft in depth at the [facility] pool. Temp of water was 91.  Pt entered/exited the pool via stairs, step to pattern with hand rail.  *squated recovery position *in 14ft8" water, unsupported backward *squated recovery position * side stepping with arms at side cues for decreased step length *prone suspension using 2 noodles ue support corner wall for ant core and hip stretch.  *BKTC at ladder working feet onto bottom step: LB and then hamstring/gastroc stretch *decompression   08/30/23 Pt seen for aquatic therapy today.  Treatment took place in water 3.5-4.75 ft in depth at the [facility] pool. Temp of water was 91.  Pt entered/exited the pool via stairs, step to pattern with hand rail.  *in 67ft8" water, unsupported backward * side stepping with arms at side cues for decreased step length *squated recovery position *prone suspension using 2 noodles ue support corner wall for ant core and hip stretch.  Does reports slight tightness in LB with position but no pain *quad and hip flexor stretching using noodle.  Limited slightly due to hamstring spasm *gastroc and ant tib stretching standing and rolling over solid noodle *Standing with ankle eversion and sup for peroneal/ankle stretch *walking between excises for recovery   08/19/23 Nustep x 10 min level 2  Supine hook lying pelvic tilts x 20 Supine with legs on red physio ball with yoga blocks for stability, active knee to chest alternating  x 20 Hooklying trunk rotation x 20 Supine hamstring stretch with strap 5 times each LE holding 10 sec each Supine Piriformis stretch 5 each side holding 10 sec each Demonstrated quad/hip flexor stretch but patient did not feel he could do this standing Trigger Point Dry-Needling  Treatment instructions: Expect mild to moderate muscle soreness. S/S of pneumothorax if dry needled over a lung field, and to seek immediate medical attention should they occur. Patient verbalized understanding of these instructions and education. Patient Consent Given: Yes Education handout provided: Yes Muscles treated: lumbar multifidi and glutes bilaterally (mainly glut min) Electrical stimulation performed: No Parameters: N/A Treatment response/outcome: Skilled palpation used to identify taut bands and trigger points.  Once identified, dry needling techniques used to treat these areas.  Mild aching response elicited..  Following treatment, patient felt mild relief of symptoms upon standing.      PATIENT EDUCATION:  Education details: issued aquatic HEP handout  Person educated: Patient Education method: Medical illustrator Education comprehension: verbalized understanding and returned demonstration  HOME EXERCISE PROGRAM: Access Code: [email or web address] URL: [email or web address] Date: 08/06/2023 Prepared by: Royal Hawthorn  Exercises - Supine Lower Trunk Rotation  - 2 x daily - 7 x weekly - 2 sets - 10 reps - 5 hold - Supine Transversus Abdominis Bracing with Pelvic Floor Contraction  - 2 x daily - 7 x weekly - 2 sets - 10 reps - 5 hold  Access Code: [email or web address] URL: [email or web address] Date: 09/08/2023 Prepared by: Geni Bers This aquatic home exercise program from [employer] utilizes pictures from land based exercises, but has been adapted prior to lamination and issuance.    ASSESSMENT:  CLINICAL IMPRESSION:  Pt is directed through aquatic HEP exercises; issued  laminated copy at end of session. Pt was not able to tolerate progression of aquatic exercises due to elevated pain levels. Pt's FOTO score improved by 4 points.  No new goals met.  Will d/c at this time per pt request.       Initial Impression Patient referred to PT for chronic lower back pain. Pt has a long history of fusions and injections to help minimize the pain. Pt demonstrates difficulty with transfers, walking and bed mobility today secondary to pain. He is interested in getting aquatic therapy prior to getting another fusion later in the year. Pt reports a  bulging of his abdominals when coming from supine to seated positions. He currently sleeps in a elevated position with a massager on his bed. Pt instructed on how to perform log rolls to help minimize his lower back pain. TPDN discussed with pt with pt reporting improvements with acupuncture in the past. Pt will benefit from aquatic therapy and other modalities, strengthening on land to help improve functional mobility with less noted pain.   OBJECTIVE IMPAIRMENTS: decreased activity tolerance, difficulty walking, decreased balance, decreased endurance, decreased mobility, decreased ROM, decreased strength, impaired flexibility, impaired UE/LE use, postural dysfunction, and pain.  ACTIVITY LIMITATIONS: bending, lifting, carry, locomotion, cleaning, community activity, driving, and or occupation  PERSONAL FACTORS: Arthritis, Back pain, DM, Dyspnea, Gout, Multiple joint pain, LE edema, sleep apnea are also affecting patient's functional outcome.  REHAB POTENTIAL: Good  CLINICAL DECISION MAKING: Evolving/moderate complexity  EVALUATION COMPLEXITY: Moderate    GOALS: Short term PT Goals Target date: 09/03/2023 Pt will be I and compliant with HEP. Baseline:  Goal status: met -  09/13/23 Pt will decrease pain by 25% overall Baseline: Goal status: no met -09/13/23  Long term PT goals Target date: 10/19/2023 Pt will improve  lumbar ROM to 50% to improve functional mobility Baseline: Goal status: deferred - 09/13/23 Pt will improve  hip/knee strength to at least 5-/5 MMT to improve functional strength Baseline: Goal status: deferred - 09/13/23 Pt will improve FOTO to at least 50% functional to show improved function Baseline:see above  Goal status: no met -09/13/23 Pt will reduce pain by overall 50% overall with usual activity Baseline: Goal status: not met - 09/13/23 Pt will report improvements with bed mobility and transfers with 3-4/10 pain to improve functional mobility.  Baseline: Goal status: Not met -09/13/23 Pt will be able to ambulate community distances at least 1000 ft WNL gait pattern without complaints Baseline: Goal status: Partially met - 09/13/23 7. Pt will be able to participate in recreational activities with 3-4/10 pain. Baseline: Goal status: Not met -09/13/23 PLAN: PT FREQUENCY: 1-3 times per week   PT DURATION: 6-12 weeks  PLANNED INTERVENTIONS (unless contraindicated): aquatic PT, Canalith repositioning, cryotherapy, Electrical stimulation, Iontophoresis with 4 mg/ml dexamethasome, Moist heat, traction, Ultrasound, gait training, Therapeutic exercise, balance training, neuromuscular re-education, patient/family education, prosthetic training, manual techniques, passive ROM, dry needling, taping, vasopnuematic device, vestibular, spinal manipulations, joint manipulations  PLAN FOR NEXT SESSION: d/c today.   Mayer Camel, PTA 09/13/23 4:33 PM [facility] [address] [place], [place], [address] Phone: [phone]   Fax:  [phone]

## 2023-09-15 ENCOUNTER — Ambulatory Visit (HOSPITAL_BASED_OUTPATIENT_CLINIC_OR_DEPARTMENT_OTHER): Payer: Medicare PPO | Admitting: Physical Therapy

## 2023-09-21 ENCOUNTER — Ambulatory Visit (HOSPITAL_BASED_OUTPATIENT_CLINIC_OR_DEPARTMENT_OTHER): Payer: Medicare PPO | Admitting: Physical Therapy

## 2023-09-23 ENCOUNTER — Ambulatory Visit (HOSPITAL_BASED_OUTPATIENT_CLINIC_OR_DEPARTMENT_OTHER): Payer: Medicare PPO | Admitting: Physical Therapy

## 2023-09-28 ENCOUNTER — Ambulatory Visit (HOSPITAL_BASED_OUTPATIENT_CLINIC_OR_DEPARTMENT_OTHER): Payer: Medicare PPO | Admitting: Physical Therapy

## 2023-09-30 ENCOUNTER — Ambulatory Visit (HOSPITAL_BASED_OUTPATIENT_CLINIC_OR_DEPARTMENT_OTHER): Payer: Medicare PPO | Admitting: Physical Therapy

## 2023-10-05 ENCOUNTER — Ambulatory Visit (HOSPITAL_BASED_OUTPATIENT_CLINIC_OR_DEPARTMENT_OTHER): Payer: Medicare PPO | Admitting: Physical Therapy

## 2023-10-07 ENCOUNTER — Ambulatory Visit (HOSPITAL_BASED_OUTPATIENT_CLINIC_OR_DEPARTMENT_OTHER): Payer: Medicare PPO | Admitting: Physical Therapy

## 2023-10-12 ENCOUNTER — Encounter (HOSPITAL_BASED_OUTPATIENT_CLINIC_OR_DEPARTMENT_OTHER): Payer: Self-pay | Admitting: Family

## 2023-10-12 ENCOUNTER — Ambulatory Visit (HOSPITAL_BASED_OUTPATIENT_CLINIC_OR_DEPARTMENT_OTHER): Payer: Medicare PPO | Admitting: Family

## 2023-10-12 VITALS — BP 142/82 | HR 90 | Ht 72.0 in | Wt 353.0 lb

## 2023-10-12 DIAGNOSIS — E119 Type 2 diabetes mellitus without complications: Secondary | ICD-10-CM

## 2023-10-12 DIAGNOSIS — I10 Essential (primary) hypertension: Secondary | ICD-10-CM

## 2023-10-12 DIAGNOSIS — I2510 Atherosclerotic heart disease of native coronary artery without angina pectoris: Secondary | ICD-10-CM | POA: Diagnosis not present

## 2023-10-12 DIAGNOSIS — E785 Hyperlipidemia, unspecified: Secondary | ICD-10-CM | POA: Diagnosis not present

## 2023-10-12 MED ORDER — AMLODIPINE BESYLATE-VALSARTAN 5-320 MG PO TABS: 1.0000 | ORAL_TABLET | Freq: Every day | ORAL | 1 refills | Status: DC

## 2023-10-12 NOTE — Patient Instructions (Addendum)
Medication Instructions:   STOP Amlodipine  STOP Hydralazine  STOP Valsartan  START Amlodipine - Valsartan 5-320mg  daily  START Rosuvastatin 10mg  three times per week  CONTINUE Carvedilol 12.5mg  twice daily  *If you need a refill on your cardiac medications before your next appointment, please call your pharmacy*  Testing/Procedures: Your EKG today looked good   Follow-Up: At [facility], you and your health needs are our priority.  As part of our continuing mission to provide you with exceptional heart care, we have created designated Provider Care Teams.  These Care Teams include your primary Cardiologist (physician) and Advanced Practice Providers (APPs -  Physician Assistants and Nurse Practitioners) who all work together to provide you with the care you need, when you need it.  We recommend signing up for the patient portal called "MyChart".  Sign up information is provided on this After Visit Summary.  MyChart is used to connect with patients for Virtual Visits (Telemedicine).  Patients are able to view lab/test results, encounter notes, upcoming appointments, etc.  Non-urgent messages can be sent to your provider as well.   To learn more about what you can do with MyChart, go to [email or web address].    Your next appointment:   3-4 months  Provider:   Jodelle Red, MD or Gillian Shields, NP    Other Instructions  Tips to Measure your Blood Pressure Correctly  Here's what you can do to ensure a correct reading:  Don't drink a caffeinated beverage or smoke during the 30 minutes before the test.  Sit quietly for five minutes before the test begins.  During the measurement, sit in a chair with your feet on the floor and your arm supported so your elbow is at about heart level.  The inflatable part of the cuff should completely cover at least 80% of your upper arm, and the cuff should be placed on bare skin, not over a shirt.  Don't talk during the  measurement.   Blood pressure categories  Blood pressure category SYSTOLIC (upper number)  DIASTOLIC (lower number)  Normal Less than 120 mm Hg and Less than 80 mm Hg  Elevated 120-129 mm Hg and Less than 80 mm Hg  High blood pressure: Stage 1 hypertension 130-139 mm Hg or 80-89 mm Hg  High blood pressure: Stage 2 hypertension 140 mm Hg or higher or 90 mm Hg or higher  Hypertensive crisis (consult your doctor immediately) Higher than 180 mm Hg and/or Higher than 120 mm Hg  Source: American Heart Association and American Stroke Association. For more on getting your blood pressure under control, buy Controlling Your Blood Pressure, a Special Health Report from [facility].   Blood Pressure Log   Date   Time  Blood Pressure  Example: Nov 1 9 AM 124/78

## 2023-10-12 NOTE — Progress Notes (Signed)
Cardiology Office Note:  .   Date:  10/12/2023  ID:  Luke Larsen, DOB 05-18-1956, MRN [id number] PCP: Laurann Montana, MD  [facility] Providers Cardiologist:  Jodelle Red, MD Cardiology APP:  Alver Sorrow, NP    History of Present Illness: Luke Larsen   Nic Lerner is a 67 y.o. male with history of type 2 diabetes, hypertension, hyperlipidemia, OSA  Established Dr. Shari Prows 02/17/2022 after referral from PCP for exertional dyspnea.  Echo 02/2022 normal LVEF 5 to 60%, mild LVH, gr1dd, no significant valvular abnormalities.  Coronary CTA 02/2022 calcium score 559 placing him in the 80th percentile for age/race/sex matched controls with nonobstructive disease to mild plaque in the LCx, LAD, RCA.  He was later seen by pharmacy team and started on [facility] for weight loss.  Seen by PCP 06/28/23 at which time [facility] initiated and valsartan-HCTZ transition to valsartan alone.  It was noted that he was not taking rosuvastatin and was agreeable to start.  Presents today for follow-up.  Presently undergoing PT for his back but anticipates needing surgery. Has upcoming visit to establish with new surgeon to discuss further. Did not tolerate Ozempic as he "hurt all over". He is up the [facility] 7.5mg  weekly without side effects. By his scale at home eh is down 10-12 lbs over the last month. Notes eating out more as his mother and mother-in-law recently moved in with him and his wife. Has not yet started statin. We reviewed indication given nonobstructive CAD.   Reports no shortness of breath at rest though notes stable dyspnea on exertion which he attributes to back pain. Reports no chest pain, pressure, or tightness. No edema, orthopnea, PND. Previously had LE edema with alternate pain medications. Reports no palpitations.    Not monitoring  BP at home but his wife recently purchased BP cuff and plans to start checking.   ROS: Please see the history of present illness.    All other systems  reviewed and are negative.   Studies Reviewed: Luke Larsen   EKG Interpretation Date/Time:  Tuesday October 12 2023 08:41:31 EST Ventricular Rate:  89 PR Interval:  142 QRS Duration:  80 QT Interval:  370 QTC Calculation: 450 R Axis:   -2  Text Interpretation: Normal sinus rhythm Normal ECG Confirmed by Gillian Shields ([id number]) on 10/12/2023 8:44:27 AM    Cardiac Studies & Procedures      ECHOCARDIOGRAM  ECHOCARDIOGRAM COMPLETE 02/26/2022  Narrative ECHOCARDIOGRAM REPORT    Patient Name:   Luke Larsen  Date of Exam: 02/26/2022 Medical Rec #:  [id number]     Height:       73.0 in Accession #:    [id number]    Weight:       362.6 lb Date of Birth:  10/27/1955     BSA:          2.771 m Patient Age:    65 years      BP:           144/88 mmHg Patient Gender: M             HR:           64 bpm. Exam Location:  [address]  Procedure: 2D Echo, Cardiac Doppler, Color Doppler and Intracardiac Opacification Agent  Indications:    R07.9* Chest pain, unspecified  History:        Patient has no prior history of Echocardiogram examinations. Risk Factors:Sleep Apnea, Hypertension and Diabetes. Precordial pain. Obesity. Spinal stenosis. Lower extremity edema.  Sonographer:    Cathie Beams RCS Referring Phys: [id number] HEATHER E [place]  IMPRESSIONS   1. Left ventricular ejection fraction, by estimation, is 55 to 60%. The left ventricle has normal function. The left ventricle has no regional wall motion abnormalities. There is mild left ventricular hypertrophy. Left ventricular diastolic parameters are consistent with Grade I diastolic dysfunction (impaired relaxation). 2. Right ventricular systolic function is normal. The right ventricular size is normal. Tricuspid regurgitation signal is inadequate for assessing PA pressure. 3. The mitral valve is normal in structure. No evidence of mitral valve regurgitation. No evidence of mitral stenosis. 4. The aortic valve is tricuspid. There is  mild calcification of the aortic valve. Aortic valve regurgitation is not visualized. No aortic stenosis is present. 5. The inferior vena cava is normal in size with greater than 50% respiratory variability, suggesting right atrial pressure of 3 mmHg.  FINDINGS Left Ventricle: Left ventricular ejection fraction, by estimation, is 55 to 60%. The left ventricle has normal function. The left ventricle has no regional wall motion abnormalities. The left ventricular internal cavity size was normal in size. There is mild left ventricular hypertrophy. Left ventricular diastolic parameters are consistent with Grade I diastolic dysfunction (impaired relaxation).  Right Ventricle: The right ventricular size is normal. No increase in right ventricular wall thickness. Right ventricular systolic function is normal. Tricuspid regurgitation signal is inadequate for assessing PA pressure.  Left Atrium: Left atrial size was normal in size.  Right Atrium: Right atrial size was normal in size.  Pericardium: There is no evidence of pericardial effusion.  Mitral Valve: The mitral valve is normal in structure. No evidence of mitral valve regurgitation. No evidence of mitral valve stenosis.  Tricuspid Valve: The tricuspid valve is normal in structure. Tricuspid valve regurgitation is not demonstrated.  Aortic Valve: The aortic valve is tricuspid. There is mild calcification of the aortic valve. Aortic valve regurgitation is not visualized. No aortic stenosis is present.  Pulmonic Valve: The pulmonic valve was normal in structure. Pulmonic valve regurgitation is not visualized.  Aorta: The aortic root is normal in size and structure.  Venous: The inferior vena cava is normal in size with greater than 50% respiratory variability, suggesting right atrial pressure of 3 mmHg.  IAS/Shunts: No atrial level shunt detected by color flow Doppler.   LEFT VENTRICLE PLAX 2D LVIDd:         4.60 cm   Diastology LVIDs:          3.10 cm   LV e' medial:    7.29 cm/s LV PW:         1.40 cm   LV E/e' medial:  9.6 LV IVS:        1.20 cm   LV e' lateral:   10.30 cm/s LVOT diam:     2.05 cm   LV E/e' lateral: 6.8 LV SV:         86 LV SV Index:   31 LVOT Area:     3.30 cm   RIGHT VENTRICLE RV Basal diam:  3.70 cm RV S prime:     8.59 cm/s TAPSE (M-mode): 1.6 cm  LEFT ATRIUM             Index        RIGHT ATRIUM           Index LA diam:        3.40 cm 1.23 cm/m   RA Area:     19.50 cm LA Vol (A2C):  65.4 ml 23.60 ml/m  RA Volume:   56.40 ml  20.36 ml/m LA Vol (A4C):   43.2 ml 15.59 ml/m LA Biplane Vol: 54.4 ml 19.63 ml/m AORTIC VALVE LVOT Vmax:   116.00 cm/s LVOT Vmean:  80.800 cm/s LVOT VTI:    0.261 m  AORTA Ao Root diam: 3.40 cm Ao Asc diam:  3.40 cm  MITRAL VALVE MV Area (PHT): 3.50 cm    SHUNTS MV Decel Time: 217 msec    Systemic VTI:  0.26 m MV E velocity: 69.70 cm/s  Systemic Diam: 2.05 cm MV A velocity: 88.60 cm/s MV E/A ratio:  0.79  Dalton McleanMD Electronically signed by Wilfred Lacy Signature Date/Time: 02/26/2022/9:46:25 AM    Final    CT SCANS  CT CORONARY MORPH W/CTA COR W/SCORE 03/12/2022  Addendum 03/12/2022 12:29 PM ADDENDUM REPORT: 03/12/2022 12:26  CLINICAL DATA:  58M with hypertension, hyperlipidemia, diabetes, and OSA with exertional dyspnea.  EXAM: Cardiac/Coronary  CT  TECHNIQUE: The patient was scanned on a [employer].  FINDINGS: A 120 kV prospective scan was triggered in the descending thoracic aorta at 111 HU's. Axial non-contrast 3 mm slices were carried out through the heart. The data set was analyzed on a dedicated work station and scored using the Agatson method. Gantry rotation speed was 250 msecs and collimation was .6 mm. No beta blockade and 0.8 mg of sl NTG was given. The 3D data set was reconstructed in 5% intervals of the 67-82 % of the R-R cycle. Diastolic phases were analyzed on a dedicated work station using MPR, MIP  and VRT modes. The patient received 80 cc of contrast.  Aorta: Ascending aorta mildly dilated. 3.7 cm. Calcification of the aortic root. No dissection.  Aortic Valve:  Trileaflet.  No calcifications.  Coronary Arteries:  Normal coronary origin.  Right dominance.  RCA is a large dominant artery that gives rise to PDA and PLVB. There is minimal (<25%) calcified plaque in the proximal, mid, and distal RCA. There is mild (25-49%) calcified plaque in the proximal PDA.  Left main is a large artery that gives rise to LAD and LCX arteries. There is minimal (<25%)calcified plaque  LAD is a large vessel that has minimal (<25%) calcified plaque proximally and mild (25-49%) calcified plaque in the mid LAD. Minimal calcified plaque distally. Mild (25-49%) calcified plaque in D1.  LCX is a non-dominant artery that gives rise to a tiny OM1 with ostial calcification. Mild (25-49%) calcified plaque in the proximal and mid LCX. OM2 is a large branch with minimal soft plaque. One large OM1 branch. There is no plaque.  Coronary Calcium Score:  Left main: 23.2  Left anterior descending artery: 424  Left circumflex artery: 33.8  Right coronary artery: 78.6  Total: 559  Percentile: 88th  Other findings:  Normal pulmonary vein drainage into the left atrium.  Normal let atrial appendage without a thrombus.  Normal size of the pulmonary artery.  IMPRESSION: 1. Coronary calcium score of 559. This was 88th percentile for age-, race-, and sex-matched controls.  2. Normal coronary origin with right dominance.  3. There is mild plaque in the left circumflex, LAD, and RCA. Non-obstructive disease. CAD-RADS 2.  4. Recommend aggressive risk factor modification including LDL goal less than 70.  Chilton Si, MD   Electronically Signed By: Chilton Si M.D. On: 03/12/2022 12:26  Narrative EXAM: OVER-READ INTERPRETATION  CT CHEST  The following report is a limited chest  CT over-read performed by radiologist Dr. Ladona Ridgel  Bradly Chris of [facility], [place] on 03/12/2022. This over-read does not include interpretation of cardiac or coronary anatomy or pathology. The coronary calcium score CT and coronary CTA interpretation by the cardiologist is attached.  COMPARISON:  CT AP 11/21/2019  FINDINGS: Vascular: No acute abnormality.  Mediastinum/Nodes: No signs of mass or adenopathy.  Lungs/Pleura: Parenchymal bands identified within both lower lobes and lingula compatible with postinflammatory change. No pleural effusion or airspace consolidation. No pneumothorax identified. No suspicious pulmonary nodule or mass identified.  Upper Abdomen: No acute abnormality. On the scout radiograph right renal calculi measuring up to 7 mm are identified.  Musculoskeletal: No chest wall mass or suspicious bone lesions identified. On the scout radiograph signs of previous posterior lumbar spine hardware fixation.  IMPRESSION: 1. No significant noncardiac supplemental findings. 2. 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 3. Right renal calculi.  Electronically Signed: By: Signa Kell M.D. On: 03/12/2022 10:19          Risk Assessment/Calculations:     HYPERTENSION CONTROL Vitals:   10/12/23 0842 10/12/23 0903  BP: (!) 148/86 (!) 142/82    The patient's blood pressure is elevated above target today.  In order to address the patient's elevated BP: A current anti-hypertensive medication was adjusted today.; Follow up with general cardiology has been recommended.          Physical Exam:   VS:  BP (!) 142/82   Pulse 90   Ht 6' (1.829 m)   Wt (!) 353 lb (160.1 kg)   SpO2 96%   BMI 47.88 kg/m    Wt Readings from Last 3 Encounters:  10/12/23 (!) 353 lb (160.1 kg)  02/26/22 (!) 359 lb 12.8 oz (163.2 kg)  02/17/22 (!) 362 lb 9.6 oz (164.5 kg)    GEN: Well nourished, well developed in no acute distress NECK: No JVD; No carotid  bruits CARDIAC: RRR, no murmurs, rubs, gallops RESPIRATORY:  Clear to auscultation without rales, wheezing or rhonchi  ABDOMEN: Soft, non-tender, non-distended EXTREMITIES:  No edema; No deformity   ASSESSMENT AND PLAN: .    Nonobstructive CAD/HLD, LDL goal less than 70 - Stable with no anginal symptoms. No indication for ischemic evaluation.  03/2023 LDL 103. Has not yet started Rosuvastatin. Previously did not tolerate Pravastatin. He is agreeable to start Rosuvastatin 10mg  three times per week. If he has recurrent myalgias, he will contact our office for consideration of alternative cholesterol lowering therapy such as Zetia, Nexlizet, or PCSK9i.   HTN- BP not at goal <130/80. Has not taken medications today. Interested in simplification of regimen. Hydrochlorothiazide previously discontinued due to urinary frequency.  Stop Amlodipine. Stop Valsartan. Stop Hydralazine 10mg  TID. Continue Carvedilol 12.5mg  BID Start Amlodipine-Valsartan 5-320mg  daily. We discussed increasing Amlodipine portion to 10mg  but he is hesitant given prior LE edema.  Discussed to monitor BP at home at least 2 hours after medications and sitting for 5-10 minutes.   DM2- Continue to follow with PCP. Appreciate inclusion of GLP1.  Morbid obesity- Weight loss via diet and exercise encouraged.  Given limitations from back issues, discussed weight loss through dietary changes. Discussed the impact being overweight would have on cardiovascular risk. Continue Mounjaro as prescribed by PCP.        Dispo: follow up in 3-4 mos  Signed, Alver Sorrow, NP

## 2023-10-26 ENCOUNTER — Encounter (HOSPITAL_BASED_OUTPATIENT_CLINIC_OR_DEPARTMENT_OTHER): Payer: Self-pay

## 2023-11-03 ENCOUNTER — Other Ambulatory Visit: Payer: Self-pay | Admitting: Neurosurgery

## 2023-11-03 DIAGNOSIS — M4316 Spondylolisthesis, lumbar region: Secondary | ICD-10-CM

## 2023-11-12 ENCOUNTER — Ambulatory Visit
Admission: RE | Admit: 2023-11-12 | Discharge: 2023-11-12 | Disposition: A | Discharge status: Home / Self Care | Payer: Medicare PPO | Source: Ambulatory Visit | Attending: Neurosurgery | Admitting: Neurosurgery

## 2023-11-12 DIAGNOSIS — M4316 Spondylolisthesis, lumbar region: Secondary | ICD-10-CM

## 2023-11-12 MED ORDER — GADOPICLENOL 0.5 MMOL/ML IV SOLN
10.0000 mL | Freq: Once | INTRAVENOUS | Status: AC | PRN
  Administered 2023-11-12: 10 mL via INTRAVENOUS

## 2023-11-25 ENCOUNTER — Other Ambulatory Visit: Payer: Self-pay | Admitting: Neurosurgery

## 2023-11-29 ENCOUNTER — Other Ambulatory Visit: Payer: Self-pay | Admitting: Neurosurgery

## 2023-12-02 NOTE — Progress Notes (Signed)
Surgical Instructions   Your procedure is scheduled on Friday, February 21st, 2025. Report to [facility] Main Entrance "A" at 6:30 A.M., then check in with the Admitting office. Any questions or running late day of surgery: call [phone]  Questions prior to your surgery date: call [phone], Monday-Friday, 8am-4pm. If you experience any cold or flu symptoms such as cough, fever, chills, shortness of breath, etc. between now and your scheduled surgery, please notify us at the above number.     Remember:  Do not eat after midnight the night before your surgery  You may drink clear liquids until 5:30 the morning of your surgery.   Clear liquids allowed are: Water, Non-Citrus Juices (without pulp), Carbonated Beverages, Clear Tea (no milk, honey, etc.), Black Coffee Only (NO MILK, CREAM OR POWDERED CREAMER of any kind), and Gatorade.    Take these medicines the morning of surgery with A SIP OF WATER: Carvedilol (Coreg)   May take these medicines IF NEEDED: Colchicine  Gabapentin (Neurontin) Naloxone (Narcan) Oxycodone-acetaminophen (percocet)    One week prior to surgery, STOP taking any Aspirin (unless otherwise instructed by your surgeon) Aleve, Naproxen, Ibuprofen, Motrin, Advil, Goody's, BC's, all herbal medications, fish oil, and non-prescription vitamins.   WHAT DO I DO ABOUT MY DIABETES MEDICATION?   Tirzepatide Greggory Keen) should be held for 7 days prior to your surgery.  Your last dose should be Friday, February 14th.      HOW TO MANAGE YOUR DIABETES BEFORE AND AFTER SURGERY  Why is it important to control my blood sugar before and after surgery? Improving blood sugar levels before and after surgery helps healing and can limit problems. A way of improving blood sugar control is eating a healthy diet by:  Eating less sugar and carbohydrates  Increasing activity/exercise  Talking with your doctor about reaching your blood sugar goals High blood sugars  (greater than 180 mg/dL) can raise your risk of infections and slow your recovery, so you will need to focus on controlling your diabetes during the weeks before surgery. Make sure that the doctor who takes care of your diabetes knows about your planned surgery including the date and location.  How do I manage my blood sugar before surgery? Check your blood sugar at least 4 times a day, starting 2 days before surgery, to make sure that the level is not too high or low.  Check your blood sugar the morning of your surgery when you wake up and every 2 hours until you get to the Short Stay unit.  If your blood sugar is less than 70 mg/dL, you will need to treat for low blood sugar: Do not take insulin. Treat a low blood sugar (less than 70 mg/dL) with  cup of clear juice (cranberry or apple), 4 glucose tablets, OR glucose gel. Recheck blood sugar in 15 minutes after treatment (to make sure it is greater than 70 mg/dL). If your blood sugar is not greater than 70 mg/dL on recheck, call [phone] for further instructions. Report your blood sugar to the short stay nurse when you get to Short Stay.  If you are admitted to the hospital after surgery: Your blood sugar will be checked by the staff and you will probably be given insulin after surgery (instead of oral diabetes medicines) to make sure you have good blood sugar levels. The goal for blood sugar control after surgery is 80-180 mg/dL.  Do NOT Smoke (Tobacco/Vaping) for 24 hours prior to your procedure.  If you use a CPAP at night, you may bring your mask/headgear for your overnight stay.   You will be asked to remove any contacts, glasses, piercing's, hearing aid's, dentures/partials prior to surgery. Please bring cases for these items if needed.    Patients discharged the day of surgery will not be allowed to drive home, and someone needs to stay with them for 24 hours.  SURGICAL WAITING ROOM VISITATION Patients  may have no more than 2 support people in the waiting area - these visitors may rotate.   Pre-op nurse will coordinate an appropriate time for 1 ADULT support person, who may not rotate, to accompany patient in pre-op.  Children under the age of 76 must have an adult with them who is not the patient and must remain in the main waiting area with an adult.  If the patient needs to stay at the hospital during part of their recovery, the visitor guidelines for inpatient rooms apply.  Please refer to the [facility] website for the visitor guidelines for any additional information.   If you received a COVID test during your pre-op visit  it is requested that you wear a mask when out in public, stay away from anyone that may not be feeling well and notify your surgeon if you develop symptoms. If you have been in contact with anyone that has tested positive in the last 10 days please notify you surgeon.      Pre-operative 5 CHG Bathing Instructions   You can play a key role in reducing the risk of infection after surgery. Your skin needs to be as free of germs as possible. You can reduce the number of germs on your skin by washing with CHG (chlorhexidine gluconate) soap before surgery. CHG is an antiseptic soap that kills germs and continues to kill germs even after washing.   DO NOT use if you have an allergy to chlorhexidine/CHG or antibacterial soaps. If your skin becomes reddened or irritated, stop using the CHG and notify one of our RNs at [phone].   Please shower with the CHG soap starting 4 days before surgery using the following schedule:     Please keep in mind the following:  DO NOT shave, including legs and underarms, starting the day of your first shower.   You may shave your face at any point before/day of surgery.  Place clean sheets on your bed the day you start using CHG soap. Use a clean washcloth (not used since being washed) for each shower. DO NOT sleep with pets once  you start using the CHG.   CHG Shower Instructions:  Wash your face and private area with normal soap. If you choose to wash your hair, wash first with your normal shampoo.  After you use shampoo/soap, rinse your hair and body thoroughly to remove shampoo/soap residue.  Turn the water OFF and apply about 3 tablespoons (45 ml) of CHG soap to a CLEAN washcloth.  Apply CHG soap ONLY FROM YOUR NECK DOWN TO YOUR TOES (washing for 3-5 minutes)  DO NOT use CHG soap on face, private areas, open wounds, or sores.  Pay special attention to the area where your surgery is being performed.  If you are having back surgery, having someone wash your back for you may be helpful. Wait 2 minutes after CHG soap is applied, then you may rinse off the CHG soap.  Pat dry with a  clean towel  Put on clean clothes/pajamas   If you choose to wear lotion, please use ONLY the CHG-compatible lotions that are listed below.  Additional instructions for the day of surgery: DO NOT APPLY any lotions, deodorants, cologne, or perfumes.   Do not bring valuables to the hospital. [facility] is not responsible for any belongings/valuables. Do not wear nail polish, gel polish, artificial nails, or any other type of covering on natural nails (fingers and toes) Do not wear jewelry or makeup Put on clean/comfortable clothes.  Please brush your teeth.  Ask your nurse before applying any prescription medications to the skin.     CHG Compatible Lotions   Aveeno Moisturizing lotion  Cetaphil Moisturizing Cream  Cetaphil Moisturizing Lotion  Clairol Herbal Essence Moisturizing Lotion, Dry Skin  Clairol Herbal Essence Moisturizing Lotion, Extra Dry Skin  Clairol Herbal Essence Moisturizing Lotion, Normal Skin  Curel Age Defying Therapeutic Moisturizing Lotion with Alpha Hydroxy  Curel Extreme Care Body Lotion  Curel Soothing Hands Moisturizing Hand Lotion  Curel Therapeutic Moisturizing Cream, Fragrance-Free  Curel Therapeutic  Moisturizing Lotion, Fragrance-Free  Curel Therapeutic Moisturizing Lotion, Original Formula  Eucerin Daily Replenishing Lotion  Eucerin Dry Skin Therapy Plus Alpha Hydroxy Crme  Eucerin Dry Skin Therapy Plus Alpha Hydroxy Lotion  Eucerin Original Crme  Eucerin Original Lotion  Eucerin Plus Crme Eucerin Plus Lotion  Eucerin TriLipid Replenishing Lotion  Keri Anti-Bacterial Hand Lotion  Keri Deep Conditioning Original Lotion Dry Skin Formula Softly Scented  Keri Deep Conditioning Original Lotion, Fragrance Free Sensitive Skin Formula  Keri Lotion Fast Absorbing Fragrance Free Sensitive Skin Formula  Keri Lotion Fast Absorbing Softly Scented Dry Skin Formula  Keri Original Lotion  Keri Skin Renewal Lotion Keri Silky Smooth Lotion  Keri Silky Smooth Sensitive Skin Lotion  Nivea Body Creamy Conditioning Oil  Nivea Body Extra Enriched Lotion  Nivea Body Original Lotion  Nivea Body Sheer Moisturizing Lotion Nivea Crme  Nivea Skin Firming Lotion  NutraDerm 30 Skin Lotion  NutraDerm Skin Lotion  NutraDerm Therapeutic Skin Cream  NutraDerm Therapeutic Skin Lotion  ProShield Protective Hand Cream  Provon moisturizing lotion  Please read over the following fact sheets that you were given.

## 2023-12-03 ENCOUNTER — Encounter (HOSPITAL_COMMUNITY): Payer: Self-pay

## 2023-12-03 ENCOUNTER — Other Ambulatory Visit: Payer: Self-pay

## 2023-12-03 ENCOUNTER — Encounter (HOSPITAL_COMMUNITY)
Admission: RE | Admit: 2023-12-03 | Discharge: 2023-12-03 | Disposition: A | Discharge status: Home / Self Care | Payer: Medicare PPO | Source: Ambulatory Visit | Attending: Neurosurgery | Admitting: Neurosurgery

## 2023-12-03 VITALS — BP 155/72 | HR 77 | Temp 97.9°F | Resp 18 | Ht 73.0 in | Wt 353.0 lb

## 2023-12-03 DIAGNOSIS — Z01812 Encounter for preprocedural laboratory examination: Secondary | ICD-10-CM | POA: Insufficient documentation

## 2023-12-03 DIAGNOSIS — Z01818 Encounter for other preprocedural examination: Secondary | ICD-10-CM

## 2023-12-03 LAB — CBC
HCT: 45.7 % (ref 39.0–52.0)
Hemoglobin: 16 g/dL (ref 13.0–17.0)
MCH: 31.4 pg (ref 26.0–34.0)
MCHC: 35 g/dL (ref 30.0–36.0)
MCV: 89.6 fL (ref 80.0–100.0)
Platelets: 254 10*3/uL (ref 150–400)
RBC: 5.1 MIL/uL (ref 4.22–5.81)
RDW: 12.1 % (ref 11.5–15.5)
WBC: 6.5 10*3/uL (ref 4.0–10.5)
nRBC: 0 % (ref 0.0–0.2)

## 2023-12-03 LAB — BASIC METABOLIC PANEL
Anion gap: 10 (ref 5–15)
BUN: 11 mg/dL (ref 8–23)
CO2: 24 mmol/L (ref 22–32)
Calcium: 8.9 mg/dL (ref 8.9–10.3)
Chloride: 103 mmol/L (ref 98–111)
Creatinine, Ser: 1.16 mg/dL (ref 0.61–1.24)
GFR, Estimated: >60 mL/min (ref >60)
Glucose, Bld: 90 mg/dL (ref 70–99)
Potassium: 4.3 mmol/L (ref 3.5–5.1)
Sodium: 137 mmol/L (ref 135–145)

## 2023-12-03 LAB — TYPE AND SCREEN
ABO/RH(D): O NEG
Antibody Screen: NEGATIVE

## 2023-12-03 LAB — SURGICAL PCR SCREEN
MRSA, PCR: NEGATIVE
Staphylococcus aureus: NEGATIVE

## 2023-12-03 LAB — HEMOGLOBIN A1C
Hgb A1c MFr Bld: 4.8 % (ref 4.8–5.6)
Mean Plasma Glucose: 91.06 mg/dL

## 2023-12-03 NOTE — Progress Notes (Signed)
Surgical Instructions   Your procedure is scheduled on Friday, February 21st, 2025. Report to [facility] Main Entrance "A" at 6:30 A.M., then check in with the Admitting office. Any questions or running late day of surgery: call [phone]  Questions prior to your surgery date: call [phone], Monday-Friday, 8am-4pm. If you experience any cold or flu symptoms such as cough, fever, chills, shortness of breath, etc. between now and your scheduled surgery, please notify us at the above number.     Remember:  Do not eat or drink anything after midnight the night before your surgery   Take these medicines the morning of surgery with A SIP OF WATER: Carvedilol (Coreg)   May take these medicines IF NEEDED: Colchicine  Gabapentin (Neurontin) Naloxone (Narcan) Oxycodone-acetaminophen (percocet)    One week prior to surgery, STOP taking any Aspirin (unless otherwise instructed by your surgeon) Aleve, Naproxen, Ibuprofen, Motrin, Advil, Goody's, BC's, all herbal medications, fish oil, and non-prescription vitamins.   WHAT DO I DO ABOUT MY DIABETES MEDICATION?   Tirzepatide Greggory Keen) should be held for 7 days prior to your surgery.  Your last dose should be Friday, February 14th.      HOW TO MANAGE YOUR DIABETES BEFORE AND AFTER SURGERY  Why is it important to control my blood sugar before and after surgery? Improving blood sugar levels before and after surgery helps healing and can limit problems. A way of improving blood sugar control is eating a healthy diet by:  Eating less sugar and carbohydrates  Increasing activity/exercise  Talking with your doctor about reaching your blood sugar goals High blood sugars (greater than 180 mg/dL) can raise your risk of infections and slow your recovery, so you will need to focus on controlling your diabetes during the weeks before surgery. Make sure that the doctor who takes care of your diabetes knows about your planned surgery  including the date and location.  How do I manage my blood sugar before surgery? Check your blood sugar at least 4 times a day, starting 2 days before surgery, to make sure that the level is not too high or low.  Check your blood sugar the morning of your surgery when you wake up and every 2 hours until you get to the Short Stay unit.  If your blood sugar is less than 70 mg/dL, you will need to treat for low blood sugar: Do not take insulin. Treat a low blood sugar (less than 70 mg/dL) with  cup of clear juice (cranberry or apple), 4 glucose tablets, OR glucose gel. Recheck blood sugar in 15 minutes after treatment (to make sure it is greater than 70 mg/dL). If your blood sugar is not greater than 70 mg/dL on recheck, call [phone] for further instructions. Report your blood sugar to the short stay nurse when you get to Short Stay.  If you are admitted to the hospital after surgery: Your blood sugar will be checked by the staff and you will probably be given insulin after surgery (instead of oral diabetes medicines) to make sure you have good blood sugar levels. The goal for blood sugar control after surgery is 80-180 mg/dL.                      Do NOT Smoke (Tobacco/Vaping) for 24 hours prior to your procedure.  If you use a CPAP at night, you may bring your mask/headgear for your overnight stay.   You will be asked to remove any contacts, glasses, piercing's,  hearing aid's, dentures/partials prior to surgery. Please bring cases for these items if needed.    Patients discharged the day of surgery will not be allowed to drive home, and someone needs to stay with them for 24 hours.  SURGICAL WAITING ROOM VISITATION Patients may have no more than 2 support people in the waiting area - these visitors may rotate.   Pre-op nurse will coordinate an appropriate time for 1 ADULT support person, who may not rotate, to accompany patient in pre-op.  Children under the age of 79 must have an  adult with them who is not the patient and must remain in the main waiting area with an adult.  If the patient needs to stay at the hospital during part of their recovery, the visitor guidelines for inpatient rooms apply.  Please refer to the [facility] website for the visitor guidelines for any additional information.   If you received a COVID test during your pre-op visit  it is requested that you wear a mask when out in public, stay away from anyone that may not be feeling well and notify your surgeon if you develop symptoms. If you have been in contact with anyone that has tested positive in the last 10 days please notify you surgeon.      Pre-operative 5 CHG Bathing Instructions   You can play a key role in reducing the risk of infection after surgery. Your skin needs to be as free of germs as possible. You can reduce the number of germs on your skin by washing with CHG (chlorhexidine gluconate) soap before surgery. CHG is an antiseptic soap that kills germs and continues to kill germs even after washing.   DO NOT use if you have an allergy to chlorhexidine/CHG or antibacterial soaps. If your skin becomes reddened or irritated, stop using the CHG and notify one of our RNs at [phone].   Please shower with the CHG soap starting 4 days before surgery using the following schedule:     Please keep in mind the following:  DO NOT shave, including legs and underarms, starting the day of your first shower.   You may shave your face at any point before/day of surgery.  Place clean sheets on your bed the day you start using CHG soap. Use a clean washcloth (not used since being washed) for each shower. DO NOT sleep with pets once you start using the CHG.   CHG Shower Instructions:  Wash your face and private area with normal soap. If you choose to wash your hair, wash first with your normal shampoo.  After you use shampoo/soap, rinse your hair and body thoroughly to remove shampoo/soap  residue.  Turn the water OFF and apply about 3 tablespoons (45 ml) of CHG soap to a CLEAN washcloth.  Apply CHG soap ONLY FROM YOUR NECK DOWN TO YOUR TOES (washing for 3-5 minutes)  DO NOT use CHG soap on face, private areas, open wounds, or sores.  Pay special attention to the area where your surgery is being performed.  If you are having back surgery, having someone wash your back for you may be helpful. Wait 2 minutes after CHG soap is applied, then you may rinse off the CHG soap.  Pat dry with a clean towel  Put on clean clothes/pajamas   If you choose to wear lotion, please use ONLY the CHG-compatible lotions that are listed below.  Additional instructions for the day of surgery: DO NOT APPLY any lotions, deodorants, cologne,  or perfumes.   Do not bring valuables to the hospital. [facility] is not responsible for any belongings/valuables. Do not wear nail polish, gel polish, artificial nails, or any other type of covering on natural nails (fingers and toes) Do not wear jewelry or makeup Put on clean/comfortable clothes.  Please brush your teeth.  Ask your nurse before applying any prescription medications to the skin.     CHG Compatible Lotions   Aveeno Moisturizing lotion  Cetaphil Moisturizing Cream  Cetaphil Moisturizing Lotion  Clairol Herbal Essence Moisturizing Lotion, Dry Skin  Clairol Herbal Essence Moisturizing Lotion, Extra Dry Skin  Clairol Herbal Essence Moisturizing Lotion, Normal Skin  Curel Age Defying Therapeutic Moisturizing Lotion with Alpha Hydroxy  Curel Extreme Care Body Lotion  Curel Soothing Hands Moisturizing Hand Lotion  Curel Therapeutic Moisturizing Cream, Fragrance-Free  Curel Therapeutic Moisturizing Lotion, Fragrance-Free  Curel Therapeutic Moisturizing Lotion, Original Formula  Eucerin Daily Replenishing Lotion  Eucerin Dry Skin Therapy Plus Alpha Hydroxy Crme  Eucerin Dry Skin Therapy Plus Alpha Hydroxy Lotion  Eucerin Original Crme   Eucerin Original Lotion  Eucerin Plus Crme Eucerin Plus Lotion  Eucerin TriLipid Replenishing Lotion  Keri Anti-Bacterial Hand Lotion  Keri Deep Conditioning Original Lotion Dry Skin Formula Softly Scented  Keri Deep Conditioning Original Lotion, Fragrance Free Sensitive Skin Formula  Keri Lotion Fast Absorbing Fragrance Free Sensitive Skin Formula  Keri Lotion Fast Absorbing Softly Scented Dry Skin Formula  Keri Original Lotion  Keri Skin Renewal Lotion Keri Silky Smooth Lotion  Keri Silky Smooth Sensitive Skin Lotion  Nivea Body Creamy Conditioning Oil  Nivea Body Extra Enriched Lotion  Nivea Body Original Lotion  Nivea Body Sheer Moisturizing Lotion Nivea Crme  Nivea Skin Firming Lotion  NutraDerm 30 Skin Lotion  NutraDerm Skin Lotion  NutraDerm Therapeutic Skin Cream  NutraDerm Therapeutic Skin Lotion  ProShield Protective Hand Cream  Provon moisturizing lotion  Please read over the following fact sheets that you were given.

## 2023-12-03 NOTE — Progress Notes (Signed)
PCP - Ollen Gross Cardiologist - Bridgette Christopher,MD  PPM/ICD - denies Device Orders -  Rep Notified -   Chest x-ray - na EKG - 10/12/23 Stress Test - denies ECHO - 11/29/21 Cardiac Cath - denies  Sleep Study - 10 years ago CPAP - yes  Fasting Blood Sugar - patient states he was diagnosed with prediabetes. He does not check his blood sugar.    Last dose of GLP1 agonist- 11/28/23  GLP1 instructions: Do not take Mounjaro after 12/03/23  Blood Thinner Instructions:na Aspirin Instructions:na  ERAS Protcol -no PRE-SURGERY Ensure or G2-   COVID TEST- na   Anesthesia review: no  Patient denies shortness of breath, fever, cough and chest pain at PAT appointment   All instructions explained to the patient, with a verbal understanding of the material. Patient agrees to go over the instructions while at home for a better understanding. Patient also instructed to wear a mask when out in public prior to surgery. The opportunity to ask questions was provided.

## 2023-12-10 ENCOUNTER — Encounter (HOSPITAL_COMMUNITY)
Admission: RE | Disposition: A | Discharge status: Home / Self Care | Payer: Self-pay | Source: Home / Self Care | Attending: Neurosurgery

## 2023-12-10 ENCOUNTER — Other Ambulatory Visit: Payer: Self-pay

## 2023-12-10 ENCOUNTER — Ambulatory Visit (HOSPITAL_COMMUNITY): Payer: Medicare PPO | Admitting: Anesthesiology

## 2023-12-10 ENCOUNTER — Ambulatory Visit (HOSPITAL_COMMUNITY)
Admission: RE | Admit: 2023-12-10 | Discharge: 2023-12-11 | Disposition: A | Discharge status: Home / Self Care | Payer: Medicare PPO | Attending: Neurosurgery | Admitting: Neurosurgery

## 2023-12-10 ENCOUNTER — Ambulatory Visit (HOSPITAL_COMMUNITY): Payer: Medicare PPO

## 2023-12-10 ENCOUNTER — Ambulatory Visit (HOSPITAL_BASED_OUTPATIENT_CLINIC_OR_DEPARTMENT_OTHER): Payer: Medicare PPO | Admitting: Anesthesiology

## 2023-12-10 ENCOUNTER — Encounter (HOSPITAL_COMMUNITY): Payer: Self-pay | Admitting: Neurosurgery

## 2023-12-10 DIAGNOSIS — M48061 Spinal stenosis, lumbar region without neurogenic claudication: Secondary | ICD-10-CM | POA: Diagnosis not present

## 2023-12-10 DIAGNOSIS — I10 Essential (primary) hypertension: Secondary | ICD-10-CM | POA: Diagnosis not present

## 2023-12-10 DIAGNOSIS — M51369 Other intervertebral disc degeneration, lumbar region without mention of lumbar back pain or lower extremity pain: Secondary | ICD-10-CM | POA: Diagnosis present

## 2023-12-10 DIAGNOSIS — M1990 Unspecified osteoarthritis, unspecified site: Secondary | ICD-10-CM | POA: Diagnosis not present

## 2023-12-10 DIAGNOSIS — E119 Type 2 diabetes mellitus without complications: Secondary | ICD-10-CM | POA: Insufficient documentation

## 2023-12-10 DIAGNOSIS — G4730 Sleep apnea, unspecified: Secondary | ICD-10-CM | POA: Diagnosis not present

## 2023-12-10 DIAGNOSIS — Z01818 Encounter for other preprocedural examination: Secondary | ICD-10-CM

## 2023-12-10 DIAGNOSIS — F419 Anxiety disorder, unspecified: Secondary | ICD-10-CM

## 2023-12-10 DIAGNOSIS — K279 Peptic ulcer, site unspecified, unspecified as acute or chronic, without hemorrhage or perforation: Secondary | ICD-10-CM | POA: Diagnosis not present

## 2023-12-10 DIAGNOSIS — M4316 Spondylolisthesis, lumbar region: Secondary | ICD-10-CM

## 2023-12-10 HISTORY — PX: POSTERIOR FUSION PEDICLE SCREW PLACEMENT: SHX2186

## 2023-12-10 LAB — GLUCOSE, CAPILLARY: Glucose-Capillary: 164 mg/dL — ABNORMAL HIGH (ref 70–99)

## 2023-12-10 SURGERY — POSTERIOR LUMBAR FUSION 1 LEVEL
Anesthesia: General | Site: Back

## 2023-12-10 MED ORDER — SUGAMMADEX SODIUM 200 MG/2ML IV SOLN
INTRAVENOUS | Status: AC
  Filled 2023-12-10: qty 4

## 2023-12-10 MED ORDER — SODIUM CHLORIDE 0.9% FLUSH
3.0000 mL | Freq: Two times a day (BID) | INTRAVENOUS | Status: DC
  Administered 2023-12-10 – 2023-12-11 (×2): 3 mL via INTRAVENOUS

## 2023-12-10 MED ORDER — MENTHOL 3 MG MT LOZG: 1.0000 | LOZENGE | OROMUCOSAL | Status: DC | PRN

## 2023-12-10 MED ORDER — SUCCINYLCHOLINE CHLORIDE 200 MG/10ML IV SOSY
PREFILLED_SYRINGE | INTRAVENOUS | Status: DC | PRN
  Administered 2023-12-10: 200 mg via INTRAVENOUS

## 2023-12-10 MED ORDER — PROPOFOL 10 MG/ML IV BOLUS
INTRAVENOUS | Status: AC
  Filled 2023-12-10: qty 20

## 2023-12-10 MED ORDER — OXYCODONE HCL 5 MG PO TABS
10.0000 mg | ORAL_TABLET | ORAL | Status: DC | PRN
  Administered 2023-12-11 (×2): 10 mg via ORAL
  Filled 2023-12-10 (×2): qty 2

## 2023-12-10 MED ORDER — ROCURONIUM BROMIDE 10 MG/ML (PF) SYRINGE
PREFILLED_SYRINGE | INTRAVENOUS | Status: DC | PRN
  Administered 2023-12-10 (×2): 20 mg via INTRAVENOUS
  Administered 2023-12-10: 10 mg via INTRAVENOUS
  Administered 2023-12-10: 20 mg via INTRAVENOUS
  Administered 2023-12-10: 50 mg via INTRAVENOUS
  Administered 2023-12-10: 20 mg via INTRAVENOUS
  Administered 2023-12-10: 30 mg via INTRAVENOUS
  Administered 2023-12-10: 10 mg via INTRAVENOUS

## 2023-12-10 MED ORDER — OXYCODONE-ACETAMINOPHEN 10-325 MG PO TABS: 1.0000 | ORAL_TABLET | Freq: Every day | ORAL | Status: DC

## 2023-12-10 MED ORDER — ROSUVASTATIN CALCIUM 5 MG PO TABS
10.0000 mg | ORAL_TABLET | ORAL | Status: DC
  Administered 2023-12-10: 10 mg via ORAL
  Filled 2023-12-10 (×2): qty 2

## 2023-12-10 MED ORDER — KETAMINE HCL 50 MG/5ML IJ SOSY
PREFILLED_SYRINGE | INTRAMUSCULAR | Status: AC
  Filled 2023-12-10: qty 5

## 2023-12-10 MED ORDER — ROCURONIUM BROMIDE 10 MG/ML (PF) SYRINGE
PREFILLED_SYRINGE | INTRAVENOUS | Status: AC
  Filled 2023-12-10: qty 10

## 2023-12-10 MED ORDER — DIAZEPAM 5 MG PO TABS
5.0000 mg | ORAL_TABLET | Freq: Four times a day (QID) | ORAL | Status: DC | PRN
  Administered 2023-12-10 – 2023-12-11 (×2): 5 mg via ORAL
  Filled 2023-12-10 (×2): qty 1

## 2023-12-10 MED ORDER — BUPIVACAINE HCL (PF) 0.5 % IJ SOLN
INTRAMUSCULAR | Status: DC | PRN
Start: 2023-12-10 — End: 2023-12-10
  Administered 2023-12-10: 30 mL

## 2023-12-10 MED ORDER — HYDROMORPHONE HCL 1 MG/ML IJ SOLN
INTRAMUSCULAR | Status: AC
Start: 2023-12-10 — End: 2023-12-11
  Filled 2023-12-10: qty 1

## 2023-12-10 MED ORDER — CEFAZOLIN SODIUM-DEXTROSE 3-4 GM/150ML-% IV SOLN
3.0000 g | INTRAVENOUS | Status: AC
  Administered 2023-12-10 (×2): 3 g via INTRAVENOUS
  Filled 2023-12-10: qty 150

## 2023-12-10 MED ORDER — SODIUM CHLORIDE 0.9 % IV SOLN
250.0000 mL | INTRAVENOUS | Status: DC
  Administered 2023-12-10: 250 mL via INTRAVENOUS

## 2023-12-10 MED ORDER — 0.9 % SODIUM CHLORIDE (POUR BTL) OPTIME
TOPICAL | Status: DC | PRN
  Administered 2023-12-10: 1000 mL

## 2023-12-10 MED ORDER — PHENYLEPHRINE 80 MCG/ML (10ML) SYRINGE FOR IV PUSH (FOR BLOOD PRESSURE SUPPORT)
PREFILLED_SYRINGE | INTRAVENOUS | Status: DC | PRN
Start: 2023-12-10 — End: 2023-12-10
  Administered 2023-12-10 (×6): 160 ug via INTRAVENOUS

## 2023-12-10 MED ORDER — SODIUM CHLORIDE 0.9 % IV SOLN: INTRAVENOUS | Status: DC | PRN

## 2023-12-10 MED ORDER — CHLORHEXIDINE GLUCONATE CLOTH 2 % EX PADS: 6.0000 | MEDICATED_PAD | Freq: Once | CUTANEOUS | Status: DC

## 2023-12-10 MED ORDER — CARVEDILOL 12.5 MG PO TABS
12.5000 mg | ORAL_TABLET | Freq: Two times a day (BID) | ORAL | Status: DC
  Administered 2023-12-10 – 2023-12-11 (×2): 12.5 mg via ORAL
  Filled 2023-12-10 (×2): qty 1

## 2023-12-10 MED ORDER — THROMBIN 20000 UNITS EX SOLR: CUTANEOUS | Status: DC | PRN

## 2023-12-10 MED ORDER — BUPIVACAINE HCL (PF) 0.5 % IJ SOLN
INTRAMUSCULAR | Status: AC
  Filled 2023-12-10: qty 30

## 2023-12-10 MED ORDER — ACETAMINOPHEN 325 MG PO TABS
650.0000 mg | ORAL_TABLET | ORAL | Status: DC | PRN
  Administered 2023-12-10: 650 mg via ORAL
  Filled 2023-12-10: qty 2

## 2023-12-10 MED ORDER — HYDROMORPHONE HCL 1 MG/ML IJ SOLN
INTRAMUSCULAR | Status: AC
  Filled 2023-12-10: qty 0.5

## 2023-12-10 MED ORDER — EPHEDRINE SULFATE-NACL 50-0.9 MG/10ML-% IV SOSY
PREFILLED_SYRINGE | INTRAVENOUS | Status: DC | PRN
  Administered 2023-12-10 (×3): 5 mg via INTRAVENOUS
  Administered 2023-12-10: 7.5 mg via INTRAVENOUS

## 2023-12-10 MED ORDER — CELECOXIB 200 MG PO CAPS
200.0000 mg | ORAL_CAPSULE | Freq: Two times a day (BID) | ORAL | Status: DC
  Administered 2023-12-10 – 2023-12-11 (×2): 200 mg via ORAL
  Filled 2023-12-10 (×2): qty 1

## 2023-12-10 MED ORDER — LIDOCAINE-EPINEPHRINE 0.5 %-1:200000 IJ SOLN
INTRAMUSCULAR | Status: AC
  Filled 2023-12-10: qty 50

## 2023-12-10 MED ORDER — ONDANSETRON HCL 4 MG PO TABS: 4.0000 mg | ORAL_TABLET | Freq: Four times a day (QID) | ORAL | Status: DC | PRN

## 2023-12-10 MED ORDER — ZOLPIDEM TARTRATE 5 MG PO TABS
5.0000 mg | ORAL_TABLET | Freq: Every evening | ORAL | Status: DC | PRN
  Administered 2023-12-10: 5 mg via ORAL
  Filled 2023-12-10: qty 1

## 2023-12-10 MED ORDER — LACTATED RINGERS IV SOLN: INTRAVENOUS | Status: DC | PRN

## 2023-12-10 MED ORDER — THROMBIN 20000 UNITS EX SOLR
CUTANEOUS | Status: AC
  Filled 2023-12-10: qty 20000

## 2023-12-10 MED ORDER — CELECOXIB 200 MG PO CAPS
200.0000 mg | ORAL_CAPSULE | Freq: Once | ORAL | Status: AC
  Administered 2023-12-10: 200 mg via ORAL
  Filled 2023-12-10: qty 1

## 2023-12-10 MED ORDER — HYDROMORPHONE HCL 1 MG/ML IJ SOLN
INTRAMUSCULAR | Status: AC
  Filled 2023-12-10: qty 1

## 2023-12-10 MED ORDER — DEXAMETHASONE SODIUM PHOSPHATE 10 MG/ML IJ SOLN
INTRAMUSCULAR | Status: DC | PRN
Start: 2023-12-10 — End: 2023-12-10
  Administered 2023-12-10: 10 mg via INTRAVENOUS

## 2023-12-10 MED ORDER — FENTANYL CITRATE (PF) 100 MCG/2ML IJ SOLN
25.0000 ug | INTRAMUSCULAR | Status: DC | PRN
  Administered 2023-12-10 (×3): 50 ug via INTRAVENOUS

## 2023-12-10 MED ORDER — HYDROMORPHONE HCL 1 MG/ML IJ SOLN
INTRAMUSCULAR | Status: AC
  Administered 2023-12-10: 0.5 mg via INTRAVENOUS
  Filled 2023-12-10: qty 1

## 2023-12-10 MED ORDER — PHENYLEPHRINE HCL-NACL 20-0.9 MG/250ML-% IV SOLN
INTRAVENOUS | Status: AC
  Filled 2023-12-10: qty 250

## 2023-12-10 MED ORDER — HYDROMORPHONE HCL 1 MG/ML IJ SOLN
0.2500 mg | INTRAMUSCULAR | Status: DC | PRN
  Administered 2023-12-10 (×3): 0.5 mg via INTRAVENOUS

## 2023-12-10 MED ORDER — KETAMINE HCL 10 MG/ML IJ SOLN: INTRAMUSCULAR | Status: DC | PRN

## 2023-12-10 MED ORDER — MIDAZOLAM HCL 2 MG/2ML IJ SOLN
INTRAMUSCULAR | Status: DC | PRN
  Administered 2023-12-10: 2 mg via INTRAVENOUS

## 2023-12-10 MED ORDER — ORAL CARE MOUTH RINSE: 15.0000 mL | Freq: Once | OROMUCOSAL | Status: AC

## 2023-12-10 MED ORDER — DEXAMETHASONE SODIUM PHOSPHATE 10 MG/ML IJ SOLN
INTRAMUSCULAR | Status: AC
  Filled 2023-12-10: qty 1

## 2023-12-10 MED ORDER — AMLODIPINE BESYLATE-VALSARTAN 5-320 MG PO TABS: 1.0000 | ORAL_TABLET | Freq: Every day | ORAL | Status: DC

## 2023-12-10 MED ORDER — OXYCODONE HCL ER 10 MG PO T12A
20.0000 mg | EXTENDED_RELEASE_TABLET | Freq: Two times a day (BID) | ORAL | Status: DC
  Administered 2023-12-10 – 2023-12-11 (×2): 20 mg via ORAL
  Filled 2023-12-10 (×2): qty 2

## 2023-12-10 MED ORDER — IRBESARTAN 300 MG PO TABS
300.0000 mg | ORAL_TABLET | Freq: Every day | ORAL | Status: DC
  Administered 2023-12-10 – 2023-12-11 (×2): 300 mg via ORAL
  Filled 2023-12-10 (×2): qty 1

## 2023-12-10 MED ORDER — LIDOCAINE 2% (20 MG/ML) 5 ML SYRINGE
INTRAMUSCULAR | Status: DC | PRN
  Administered 2023-12-10: 100 mg via INTRAVENOUS

## 2023-12-10 MED ORDER — NOREPINEPHRINE 4 MG/250ML-% IV SOLN
INTRAVENOUS | Status: AC
  Filled 2023-12-10: qty 250

## 2023-12-10 MED ORDER — HYDROMORPHONE HCL 1 MG/ML IJ SOLN
INTRAMUSCULAR | Status: DC | PRN
  Administered 2023-12-10 (×2): .5 mg via INTRAVENOUS

## 2023-12-10 MED ORDER — PHENYLEPHRINE HCL-NACL 20-0.9 MG/250ML-% IV SOLN
INTRAVENOUS | Status: DC | PRN
  Administered 2023-12-10: 30 ug/min via INTRAVENOUS

## 2023-12-10 MED ORDER — FENTANYL CITRATE (PF) 250 MCG/5ML IJ SOLN
INTRAMUSCULAR | Status: DC | PRN
  Administered 2023-12-10 (×3): 50 ug via INTRAVENOUS
  Administered 2023-12-10: 100 ug via INTRAVENOUS

## 2023-12-10 MED ORDER — CHLORHEXIDINE GLUCONATE 0.12 % MT SOLN
15.0000 mL | Freq: Once | OROMUCOSAL | Status: AC
  Administered 2023-12-10: 15 mL via OROMUCOSAL
  Filled 2023-12-10: qty 15

## 2023-12-10 MED ORDER — AMLODIPINE BESYLATE 5 MG PO TABS
5.0000 mg | ORAL_TABLET | Freq: Every day | ORAL | Status: DC
  Administered 2023-12-10 – 2023-12-11 (×2): 5 mg via ORAL
  Filled 2023-12-10 (×2): qty 1

## 2023-12-10 MED ORDER — PHENOL 1.4 % MT LIQD: 1.0000 | OROMUCOSAL | Status: DC | PRN

## 2023-12-10 MED ORDER — ONDANSETRON HCL 4 MG/2ML IJ SOLN: 4.0000 mg | Freq: Four times a day (QID) | INTRAMUSCULAR | Status: DC | PRN

## 2023-12-10 MED ORDER — FENTANYL CITRATE (PF) 250 MCG/5ML IJ SOLN
INTRAMUSCULAR | Status: AC
  Filled 2023-12-10: qty 5

## 2023-12-10 MED ORDER — FENTANYL CITRATE (PF) 100 MCG/2ML IJ SOLN
INTRAMUSCULAR | Status: AC
Start: 2023-12-10 — End: 2023-12-11
  Filled 2023-12-10: qty 2

## 2023-12-10 MED ORDER — SODIUM CHLORIDE 0.9% FLUSH: 3.0000 mL | INTRAVENOUS | Status: DC | PRN

## 2023-12-10 MED ORDER — ACETAMINOPHEN 650 MG RE SUPP: 650.0000 mg | RECTAL | Status: DC | PRN

## 2023-12-10 MED ORDER — KETAMINE HCL 50 MG/5ML IJ SOSY
PREFILLED_SYRINGE | INTRAMUSCULAR | Status: DC | PRN
  Administered 2023-12-10 (×2): 10 mg via INTRAVENOUS
  Administered 2023-12-10: 20 mg via INTRAVENOUS
  Administered 2023-12-10: 10 mg via INTRAVENOUS
  Administered 2023-12-10: 50 mg via INTRAVENOUS

## 2023-12-10 MED ORDER — ADULT MULTIVITAMIN W/MINERALS CH
1.0000 | ORAL_TABLET | Freq: Every day | ORAL | Status: DC
  Administered 2023-12-10 – 2023-12-11 (×2): 1 via ORAL
  Filled 2023-12-10 (×2): qty 1

## 2023-12-10 MED ORDER — MIDAZOLAM HCL 2 MG/2ML IJ SOLN
INTRAMUSCULAR | Status: AC
  Filled 2023-12-10: qty 2

## 2023-12-10 MED ORDER — MORPHINE SULFATE (PF) 2 MG/ML IV SOLN
2.0000 mg | INTRAVENOUS | Status: DC | PRN
  Administered 2023-12-11 (×4): 2 mg via INTRAVENOUS
  Filled 2023-12-10 (×4): qty 1

## 2023-12-10 MED ORDER — ACETAMINOPHEN 500 MG PO TABS
1000.0000 mg | ORAL_TABLET | Freq: Once | ORAL | Status: AC
  Administered 2023-12-10: 500 mg via ORAL
  Filled 2023-12-10: qty 2

## 2023-12-10 MED ORDER — PROPOFOL 10 MG/ML IV BOLUS
INTRAVENOUS | Status: DC | PRN
Start: 2023-12-10 — End: 2023-12-10
  Administered 2023-12-10: 200 mg via INTRAVENOUS
  Administered 2023-12-10: 50 ug/kg/min via INTRAVENOUS

## 2023-12-10 MED ORDER — SUGAMMADEX SODIUM 200 MG/2ML IV SOLN
INTRAVENOUS | Status: DC | PRN
  Administered 2023-12-10: 400 mg via INTRAVENOUS

## 2023-12-10 MED ORDER — ALBUMIN HUMAN 5 % IV SOLN: INTRAVENOUS | Status: DC | PRN

## 2023-12-10 MED ORDER — ONDANSETRON HCL 4 MG/2ML IJ SOLN
INTRAMUSCULAR | Status: AC
  Filled 2023-12-10: qty 2

## 2023-12-10 MED ORDER — LIDOCAINE-EPINEPHRINE 0.5 %-1:200000 IJ SOLN
INTRAMUSCULAR | Status: DC | PRN
  Administered 2023-12-10: 10 mL

## 2023-12-10 MED ORDER — SENNA 8.6 MG PO TABS
1.0000 | ORAL_TABLET | Freq: Two times a day (BID) | ORAL | Status: DC
  Administered 2023-12-10 – 2023-12-11 (×2): 8.6 mg via ORAL
  Filled 2023-12-10 (×2): qty 1

## 2023-12-10 MED ORDER — POTASSIUM CHLORIDE IN NACL 20-0.9 MEQ/L-% IV SOLN
INTRAVENOUS | Status: DC
  Filled 2023-12-10 (×2): qty 1000

## 2023-12-10 MED ORDER — COLCHICINE 0.6 MG PO TABS: 0.6000 mg | ORAL_TABLET | Freq: Every day | ORAL | Status: DC | PRN

## 2023-12-10 MED ORDER — ONDANSETRON HCL 4 MG/2ML IJ SOLN
INTRAMUSCULAR | Status: DC | PRN
  Administered 2023-12-10: 4 mg via INTRAVENOUS

## 2023-12-10 MED ORDER — GABAPENTIN 300 MG PO CAPS: 300.0000 mg | ORAL_CAPSULE | Freq: Two times a day (BID) | ORAL | Status: DC | PRN

## 2023-12-10 SURGICAL SUPPLY — 57 items
BAG COUNTER SPONGE SURGICOUNT (BAG) ×1 IMPLANT
BASKET BONE COLLECTION (BASKET) ×1 IMPLANT
BENZOIN TINCTURE PRP APPL 2/3 (GAUZE/BANDAGES/DRESSINGS) IMPLANT
BLADE BONE MILL MEDIUM (MISCELLANEOUS) ×1 IMPLANT
BLADE CLIPPER SURG (BLADE) IMPLANT
BUR MATCHSTICK NEURO 3.0 LAGG (BURR) ×1 IMPLANT
BUR PRECISION FLUTE 5.0 (BURR) ×1 IMPLANT
CANISTER SUCT 3000ML PPV (MISCELLANEOUS) ×1 IMPLANT
CNTNR URN SCR LID CUP LEK RST (MISCELLANEOUS) ×1 IMPLANT
COVER BACK TABLE 60X90IN (DRAPES) ×1 IMPLANT
DERMABOND ADVANCED .7 DNX12 (GAUZE/BANDAGES/DRESSINGS) ×1 IMPLANT
DRAPE C-ARM 42X72 X-RAY (DRAPES) ×2 IMPLANT
DRAPE C-ARMOR (DRAPES) IMPLANT
DRAPE LAPAROTOMY 100X72X124 (DRAPES) ×1 IMPLANT
DRAPE SURG 17X23 STRL (DRAPES) ×1 IMPLANT
DURAPREP 26ML APPLICATOR (WOUND CARE) ×1 IMPLANT
ELECT REM PT RETURN 9FT ADLT (ELECTROSURGICAL) ×1
ELECTRODE REM PT RTRN 9FT ADLT (ELECTROSURGICAL) ×1 IMPLANT
GAUZE 4X4 16PLY ~~LOC~~+RFID DBL (SPONGE) IMPLANT
GAUZE SPONGE 4X4 12PLY STRL (GAUZE/BANDAGES/DRESSINGS) IMPLANT
GLOVE EXAM NITRILE XL STR (GLOVE) IMPLANT
GLOVE SURG LTX SZ6.5 (GLOVE) ×2 IMPLANT
GOWN STRL REUS W/ TWL LRG LVL3 (GOWN DISPOSABLE) ×2 IMPLANT
GOWN STRL REUS W/ TWL XL LVL3 (GOWN DISPOSABLE) IMPLANT
GOWN STRL REUS W/TWL 2XL LVL3 (GOWN DISPOSABLE) IMPLANT
GRAFT DURAGEN MATRIX 1WX1L (Tissue) IMPLANT
KIT ALARA NEURO ACCESS (KITS) IMPLANT
KIT BASIN OR (CUSTOM PROCEDURE TRAY) ×1 IMPLANT
KIT POSITION SURG JACKSON T1 (MISCELLANEOUS) ×1 IMPLANT
KIT TURNOVER KIT B (KITS) ×1 IMPLANT
MILL BONE PREP (MISCELLANEOUS) IMPLANT
NDL HYPO 25X1 1.5 SAFETY (NEEDLE) ×1 IMPLANT
NDL SPNL 18GX3.5 QUINCKE PK (NEEDLE) IMPLANT
NEEDLE HYPO 25X1 1.5 SAFETY (NEEDLE) ×1
NEEDLE SPNL 18GX3.5 QUINCKE PK (NEEDLE)
NS IRRIG 1000ML POUR BTL (IV SOLUTION) ×1 IMPLANT
PACK LAMINECTOMY NEURO (CUSTOM PROCEDURE TRAY) ×1 IMPLANT
PAD ARMBOARD 7.5X6 YLW CONV (MISCELLANEOUS) ×2 IMPLANT
RASP 3.0MM (RASP) IMPLANT
ROD PRE LORDOSED VIPER 5.5X50 (Rod) IMPLANT
SCREW POLY VIPER2 7X40MM (Screw) IMPLANT
SCREW SET SINGLE INNER MIS (Screw) IMPLANT
SEALANT ADHERUS EXTEND TIP (MISCELLANEOUS) IMPLANT
SPACER OLIF DOME 9X26X9.5 10D (Spacer) IMPLANT
SPIKE FLUID TRANSFER (MISCELLANEOUS) ×1 IMPLANT
SPONGE SURGIFOAM ABS GEL 100 (HEMOSTASIS) ×1 IMPLANT
SPONGE T-LAP 4X18 ~~LOC~~+RFID (SPONGE) IMPLANT
STRIP CLOSURE SKIN 1/2X4 (GAUZE/BANDAGES/DRESSINGS) IMPLANT
SUT PROLENE 6 0 BV (SUTURE) IMPLANT
SUT VIC AB 0 CT1 18XCR BRD8 (SUTURE) ×1 IMPLANT
SUT VIC AB 2-0 CT1 18 (SUTURE) ×1 IMPLANT
SUT VIC AB 3-0 SH 8-18 (SUTURE) ×1 IMPLANT
TAP CANN VIPER2 DL 6.0 (TAP) IMPLANT
TOWEL GREEN STERILE (TOWEL DISPOSABLE) ×1 IMPLANT
TOWEL GREEN STERILE FF (TOWEL DISPOSABLE) ×1 IMPLANT
TRAY FOLEY MTR SLVR 16FR STAT (SET/KITS/TRAYS/PACK) ×1 IMPLANT
WATER STERILE IRR 1000ML POUR (IV SOLUTION) ×1 IMPLANT

## 2023-12-10 NOTE — Transfer of Care (Signed)
Immediate Anesthesia Transfer of Care Note  Patient: Luke Larsen  Procedure(s) Performed: PLIF - L1-L2 - Posterior Lateral and Interbody fusion - T10 - L2 (Back)  Patient Location: PACU  Anesthesia Type:General  Level of Consciousness: drowsy  Airway & Oxygen Therapy: Patient Spontanous Breathing and Patient connected to face mask oxygen  Post-op Assessment: Report given to RN and Post -op Vital signs reviewed and stable  Post vital signs: Reviewed and stable  Last Vitals:  Vitals Value Taken Time  BP 132/60 12/10/23 1503  Temp    Pulse 85 12/10/23 1505  Resp 17 12/10/23 1505  SpO2 92 % 12/10/23 1505  Vitals shown include unfiled device data.  Last Pain:  Vitals:   12/10/23 0715  TempSrc:   PainSc: 0-No pain         Complications: No notable events documented.

## 2023-12-10 NOTE — Anesthesia Preprocedure Evaluation (Signed)
Anesthesia Evaluation  Patient identified by MRN, date of birth, ID band Patient awake    Reviewed: Allergy & Precautions, NPO status , Patient's Chart, lab work & pertinent test results  Airway Mallampati: III  TM Distance: >3 FB Neck ROM: Full    Dental no notable dental hx.    Pulmonary sleep apnea    Pulmonary exam normal        Cardiovascular hypertension, Pt. on medications and Pt. on home beta blockers  Rhythm:Regular Rate:Normal     Neuro/Psych   Anxiety        GI/Hepatic Neg liver ROS, PUD,,,  Endo/Other  diabetes    Renal/GU   negative genitourinary   Musculoskeletal  (+) Arthritis , Osteoarthritis,    Abdominal  (+) + obese  Peds  Hematology Lab Results      Component                Value               Date                      WBC                      6.5                 12/03/2023                HGB                      16.0                12/03/2023                HCT                      45.7                12/03/2023                MCV                      89.6                12/03/2023                PLT                      254                 12/03/2023              Anesthesia Other Findings   Reproductive/Obstetrics                             Anesthesia Physical Anesthesia Plan  ASA: 3  Anesthesia Plan: General   Post-op Pain Management: Celebrex PO (pre-op)*, Tylenol PO (pre-op)* and Ketamine IV*   Induction: Intravenous  PONV Risk Score and Plan: 2 and Ondansetron, Dexamethasone, Midazolam and Treatment may vary due to age or medical condition  Airway Management Planned: Mask, Oral ETT and Video Laryngoscope Planned  Additional Equipment: None  Intra-op Plan:   Post-operative Plan: Extubation in OR  Informed Consent: I have reviewed the patients History and Physical, chart, labs and discussed the procedure including the risks, benefits and alternatives  for the proposed anesthesia with the patient or authorized representative who has indicated his/her understanding and acceptance.     Dental advisory given  Plan Discussed with: CRNA  Anesthesia Plan Comments:        Anesthesia Quick Evaluation

## 2023-12-10 NOTE — Anesthesia Procedure Notes (Signed)
Procedure Name: Intubation Date/Time: 12/10/2023 8:50 AM  Performed by: Randa Evens, CRNAPre-anesthesia Checklist: Patient identified, Emergency Drugs available, Suction available and Patient being monitored Patient Re-evaluated:Patient Re-evaluated prior to induction Oxygen Delivery Method: Circle System Utilized Preoxygenation: Pre-oxygenation with 100% oxygen Induction Type: IV induction Ventilation: Mask ventilation without difficulty Laryngoscope Size: Glidescope and 4 Grade View: Grade I Tube type: Oral Tube size: 8.0 mm Number of attempts: 1 Airway Equipment and Method: Stylet and Oral airway Placement Confirmation: ETT inserted through vocal cords under direct vision, positive ETCO2 and breath sounds checked- equal and bilateral Secured at: 23 cm Tube secured with: Tape Dental Injury: Teeth and Oropharynx as per pre-operative assessment

## 2023-12-10 NOTE — H&P (Signed)
BP (!) 160/73   Pulse 82   Temp 98.1 F (36.7 C) (Oral)   Resp 18   Ht 6\' 1"  (1.854 m)   Wt (!) 158.8 kg   SpO2 94%   BMI 46.18 kg/m   Mr. Luke Larsen is a patient of mine who I last operated on in 2020, where I did an L2-L3 decompression on the right side for pain on the right and also a right L4 hemilaminectomy for pain.  He has subsequently undergone 2 more procedures, though both being arthrodesis and with pedicle screws and cages done by Dr. Yevette Edwards in 2022 in November and February.  He has had multiple injections.  He has had time.  He is currently taking Ozempic. He was on [facility] because he is trying to lose weight.  He weighs 340 or so pounds, on our scale today it said 350. He was told by another physician in [place] who is going to do his procedure that he needed to lose approximately 50 pounds.  Therefore, he came in to see me for another opinion.  The last study that he has is an MRI of the lumbar spine in 2022.   Mr. Luke Larsen is alert, oriented x4.  He says that the pain is something that is significantly restricting his ability to both work and just live.  He weighs 350 pounds.  Temperature is 98.7, blood pressure 159/88, pulse 76, pain is 7/10.  He is alert, oriented x4. Has a markedly antalgic gait.  Negative Romberg. He can toe walk.  He can heel walk though with some difficulty.  He has normal muscle tone, bulk, and coordination. He has 1+ reflexes at the knees, not elicited at the ankles.   I was looking through the old films.  The hardware that is in 2, 3 and 4 all appears to be in correct position.  He is developing more and more adjacent segment disease now at 1-2.  He has a retrolisthesis of 1 on 2 and he has some mild stenosis present at that level.  4-5 looks okay, 5-1 looks okay and abnormal but they are fine. His complaint is that after all 3 of these operations, he still has pain and it is still in the same area, left buttocks into the thigh, somewhat medial thigh going into  the medial aspect of the calf and he says after each operation he feels better, but then 4 months down the line he hurts and he hurts the exact same way and the other component of his pain is in the left region above the buttocks.  He doesn't have a new MRI, which is necessary in order to provide some information and to provide a prognosis and what may possibly be done.  I don't disagree with the weight loss.  He says he is actively doing it, has lost about 17 pounds, but he does need to lose the weight. And pain remains in  the left buttocks, but in the right lower extremity.  Mr. Luke Larsen returned today with an MRI of the lumbar spine.  It shows that the L1-2 level is further degenerated than had been previously.  This is the adjacent segment.  I do believe he will benefit from a decompression and arthrodesis.  The question I have is whether or not I should carry screws into the lower thoracic region to obviate the need for a T12-L1 procedure in the future.  Mr. Luke Larsen is hurting a lot.  He would like to proceed with  this operation.  He has absolutely no hesitation.  He says he would like to avoid future operations, so I wanted to put those screws there, then I should just go ahead and do it because the prospect of coming back is something he does not relish at all.

## 2023-12-10 NOTE — Progress Notes (Signed)
Patient received from PACU, allert and oriented X4, tele box connected, call bell within reach, will continue to monitor

## 2023-12-11 DIAGNOSIS — M4316 Spondylolisthesis, lumbar region: Secondary | ICD-10-CM | POA: Diagnosis not present

## 2023-12-11 MED ORDER — OXYCODONE HCL 5 MG PO TABS: 10.0000 mg | ORAL_TABLET | Freq: Four times a day (QID) | ORAL | 0 refills | Status: AC | PRN

## 2023-12-11 MED ORDER — CHLORHEXIDINE GLUCONATE CLOTH 2 % EX PADS
6.0000 | MEDICATED_PAD | Freq: Every day | CUTANEOUS | Status: DC
  Administered 2023-12-11: 6 via TOPICAL

## 2023-12-11 MED ORDER — TIZANIDINE HCL 4 MG PO TABS: 4.0000 mg | ORAL_TABLET | Freq: Four times a day (QID) | ORAL | 0 refills | Status: AC | PRN

## 2023-12-11 MED ORDER — ORAL CARE MOUTH RINSE: 15.0000 mL | OROMUCOSAL | Status: DC | PRN

## 2023-12-11 MED ORDER — OXYCODONE HCL ER 20 MG PO T12A
20.0000 mg | EXTENDED_RELEASE_TABLET | Freq: Two times a day (BID) | ORAL | 0 refills | Status: AC
Start: 2023-12-11 — End: ?

## 2023-12-11 NOTE — Progress Notes (Signed)
 Patient walked in the hallway, no dizziness, did have pain when ambulating, gave PRN pain medication, had some relief. Foley's catheter removed, did urinate after 2 hour, AVS instructions given, patient and her wife verbalized understanding, discharged at 1830 (dropped in the main Entrance A to wife's car)

## 2023-12-11 NOTE — Discharge Instructions (Signed)

## 2023-12-11 NOTE — Progress Notes (Signed)
 Patient ID: Luke Larsen, male   DOB: 10/28/1955, 68 y.o.   MRN: [id number] BP 135/76 (BP Location: Right Wrist)   Pulse 86   Temp 97.7 F (36.5 C) (Oral)   Resp 16   Ht 6\' 1"  (1.854 m)   Wt (!) 158.8 kg   SpO2 96%   BMI 46.18 kg/m  Alert and oriented x 4, speech is clear and fluent. Moving all extremities Wound dressing has been changed twice, current dressing is dry.  He continues on bed rest, but can get up approximately 1530.

## 2023-12-11 NOTE — Plan of Care (Signed)

## 2023-12-11 NOTE — Op Note (Signed)
 12/10/2023  11:08 AM  PATIENT:  Luke Larsen  68 y.o. 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   POST-OPERATIVE DIAGNOSIS:  same PROCEDURE:  Procedure(s): PLIF - L1-L2, Half Dome X cages filled with autograft morsels, disc space filled with autograft morsels Laminectomy L1, well beyond the needed exposure for the PLIF Non segmental Pedicle screw fixation (Synthes) L1-2 Rods cut between L2,3 screws bilaterally    SURGEON:  Surgeon(s): Coletta Memos, MD Tressie Stalker, MD  ASSISTANTS:Jenkins, Tinnie Gens  ANESTHESIA:   general  EBL:  Total I/O In: 200 [P.O.:200] Out: 1400 [Urine:1400]  BLOOD ADMINISTERED:none  CELL SAVER GIVEN:none  COUNT:per nursing  DRAINS: Urinary Catheter (Foley)   SPECIMEN:  No Specimen  DICTATION: Dabid Larsen is a 68 y.o. male whom was taken to the operating room intubated, and placed under a general anesthetic without difficulty. A foley catheter was placed under sterile conditions. He was positioned prone on a Jackson table with all pressure points properly padded.  [name]' lumbar region was prepped and draped in a sterile manner. I infiltrated 20cc's 1/2%lidocaine/1:2000,000 strength epinephrine into the planned incision. I opened the skin with a 10 blade and took the incision down to the thoracolumbar fascia. I exposed the lamina of T12, L1, and L2 in a subperiosteal fashion bilaterally. I confirmed my location with an intraoperative xray.  I placed self retaining retractors and started the decompression.  I decompressed the spinal canal via a semihemilaminectomy of L1 bilaterally well beyond the needed exposure for the subsequent PLIF. I used the drill and Kerrison punches to remove the bone and ligament. I performed inferior facetectomies of L1 on both sides, and partial superior facetectomies of L2 to decompress the L1 roots in the lateral recesses and foramina. The  spinal canal was decompressed with the laminectomies.   PLIF's were performed at L1/2 in the same fashion. I opened the disc space with a 15 blade then used a variety of instruments to remove the disc and prepare the space for the arthrodesis. I used curettes, rongeurs, punches, shavers for the disc space, and rasps in the discetomy. I measured the disc space and placed 2 Half Dome X titanium expandable cages filled with autograft morsels into the disc spaces. I also packed the disc space anterior and around the cages with autograft morsels.  I placed pedicle screws at L1, using fluoroscopic guidance. I drilled a pilot hole, then cannulated the pedicle with a probe at each site. I then tapped each pedicle, assessing each site for pedicle violations. No cutouts were appreciated. Screws (Synthes) were then placed at each site without difficulty. I exposed the rod between the L2 and L3 screws. I cut the rods and removed them from the L2 screws. I then placed a new rod from L1-L2 and  attached rods and locking caps with the appropriate tools. The locking caps were secured with torque limited screwdrivers. Final films were performed and the final construct appeared to be in good position.  At closing I noticed some spinal fluid on the left side when I retracted the thecal sac. There was a small durotomy which was too small to close. I placed a duragen patch and a dural sealant. We closed the wound in a layered fashion. We approximated the thoracolumbar fascia, subcutaneous, and subcuticular planes with vicryl sutures. I used dermabond and an occlusive bandage for a sterile dressing.     PLAN OF CARE: Admit to inpatient   PATIENT  DISPOSITION:  PACU - hemodynamically stable.   Delay start of Pharmacological VTE agent (>24hrs) due to surgical blood loss or risk of bleeding:  yes

## 2023-12-11 NOTE — Discharge Summary (Signed)
 Physician Discharge Summary  Patient ID: Luke Larsen MRN: [id number] DOB/AGE: 11/23/1955 68 y.o.  Admit date: 12/10/2023 Discharge date: 12/11/2023  Admission Diagnoses:  Discharge Diagnoses:  Principal Problem:   Lumbar adjacent segment disease with spondylolisthesis   Discharged Condition: good  Hospital Course: Luke Larsen was admitted and taken to the operating room for adjacent segment disease at L1/2. There was a durotomy, too small to repair primarily but a duragen patch and adherus, a dural sealant. Luke Larsen was on bedrest for 24hrs He is ambulating, voiding, and tolerating a regular diet at discharge  Treatments: surgery: PROCEDURE:  Procedure(s): PLIF - L1-L2, Half Dome X cages filled with autograft morsels, disc space filled with autograft morsels Laminectomy L1, well beyond the needed exposure for the PLIF Non segmental Pedicle screw fixation (Synthes) L1-2 Rods cut between L2,3 screws bilaterally   Discharge Exam: Blood pressure 135/76, pulse 86, temperature 97.7 F (36.5 C), temperature source Oral, resp. rate 16, height 6\' 1"  (1.854 m), weight (!) 158.8 kg, SpO2 96%. General appearance: alert, cooperative, appears stated age, and moderate distress  Disposition: Discharge disposition: 01-Home or Self Care      SPIONDYLOLISTHESIS, LUMBAR REGION  Allergies as of 12/11/2023       Reactions   Pravastatin Sodium    Myalgias   Qsymia [phentermine-topiramate]    tired, paresthesias   Cymbalta [duloxetine Hcl] Other (See Comments)   "It made me feel really weird, awful."        Medication List     TAKE these medications    Accu-Chek Guide test strip Generic drug: glucose blood USE AS DIRECTED UP TO 4 TIMES A DAY   amLODipine-valsartan 5-320 MG tablet Commonly known as: EXFORGE Take 1 tablet by mouth daily.   blood glucose meter kit and supplies Kit Dispense based on patient and insurance preference. Use up to four times daily as directed. (FOR  ICD-9 250.00, 250.01).   carvedilol 12.5 MG tablet Commonly known as: COREG Take 12.5 mg by mouth 2 (two) times daily.   colchicine 0.6 MG tablet Take 0.6 mg by mouth daily as needed (gout flare).   Eszopiclone 3 MG Tabs Take 3 mg by mouth at bedtime.   gabapentin 300 MG capsule Commonly known as: NEURONTIN Take 300 mg by mouth 2 (two) times daily as needed (pain).   hydrALAZINE 10 MG tablet Commonly known as: APRESOLINE Take 10 mg by mouth 3 (three) times daily.   Mounjaro 7.5 MG/0.5ML Pen Generic drug: tirzepatide Inject 7.5 mg into the skin once a week.   multivitamin with minerals Tabs tablet Take 1 tablet by mouth daily.   naloxone 4 MG/0.1ML Liqd nasal spray kit Commonly known as: NARCAN Place 1 spray into the nose as needed. overdose   oxyCODONE 20 mg 12 hr tablet Commonly known as: OXYCONTIN Take 1 tablet (20 mg total) by mouth every 12 (twelve) hours.   oxyCODONE 5 MG immediate release tablet Commonly known as: Oxy IR/ROXICODONE Take 2 tablets (10 mg total) by mouth every 6 (six) hours as needed for up to 11 days for severe pain (pain score 7-10).   oxyCODONE-acetaminophen 10-325 MG tablet Commonly known as: PERCOCET Take 1 tablet by mouth 6 (six) times daily.   rosuvastatin 10 MG tablet Commonly known as: CRESTOR Take 1 tablet (10 mg total) by mouth daily. What changed: when to take this   tiZANidine 4 MG tablet Commonly known as: ZANAFLEX Take 1 tablet (4 mg total) by mouth every 6 (six) hours as needed  for muscle spasms.        Follow-up Information     Coletta Memos, MD Follow up.   Specialty: Neurosurgery Why: keep your scheduled appointment Contact information: [address] [place] [address] [phone]                 Signed: Coletta Memos 12/11/2023, 10:56 AM

## 2023-12-13 LAB — GLUCOSE, CAPILLARY: Glucose-Capillary: 113 mg/dL — ABNORMAL HIGH (ref 70–99)

## 2023-12-13 NOTE — Anesthesia Postprocedure Evaluation (Signed)
 Anesthesia Post Note  Patient: Luke Larsen  Procedure(s) Performed: PLIF - L1-L2 - Posterior Lateral and Interbody fusion - T10 - L2 (Back)     Patient location during evaluation: PACU Anesthesia Type: General Level of consciousness: awake and alert Pain management: pain level controlled Vital Signs Assessment: post-procedure vital signs reviewed and stable Respiratory status: spontaneous breathing, nonlabored ventilation, respiratory function stable and patient connected to nasal cannula oxygen Cardiovascular status: blood pressure returned to baseline and stable Postop Assessment: no apparent nausea or vomiting Anesthetic complications: no   No notable events documented.  Last Vitals:  Vitals:   12/11/23 1144 12/11/23 1543  BP: 133/72 139/69  Pulse: 79 79  Resp: 14 16  Temp: 36.7 C (!) 36.4 C  SpO2: 97% 99%    Last Pain:  Vitals:   12/11/23 1804  TempSrc:   PainSc: 10-Worst pain ever                 Earl Lites P Jasai Sorg

## 2023-12-14 MED FILL — Heparin Sodium (Porcine) Inj 1000 Unit/ML: INTRAMUSCULAR | Qty: 30 | Status: AC

## 2023-12-14 MED FILL — Sodium Chloride IV Soln 0.9%: INTRAVENOUS | Qty: 2000 | Status: AC

## 2023-12-16 ENCOUNTER — Encounter (HOSPITAL_COMMUNITY): Payer: Self-pay | Admitting: Neurosurgery

## 2024-01-24 ENCOUNTER — Ambulatory Visit (HOSPITAL_BASED_OUTPATIENT_CLINIC_OR_DEPARTMENT_OTHER): Payer: Medicare PPO | Admitting: Cardiology

## 2024-03-03 ENCOUNTER — Other Ambulatory Visit: Payer: Self-pay | Admitting: Neurosurgery

## 2024-03-03 DIAGNOSIS — M4316 Spondylolisthesis, lumbar region: Secondary | ICD-10-CM

## 2024-03-08 ENCOUNTER — Encounter: Payer: Self-pay | Admitting: Neurosurgery

## 2024-03-21 ENCOUNTER — Other Ambulatory Visit (HOSPITAL_BASED_OUTPATIENT_CLINIC_OR_DEPARTMENT_OTHER): Payer: Self-pay | Admitting: Family

## 2024-03-21 ENCOUNTER — Other Ambulatory Visit

## 2024-03-21 DIAGNOSIS — I10 Essential (primary) hypertension: Secondary | ICD-10-CM

## 2024-03-24 ENCOUNTER — Other Ambulatory Visit

## 2024-03-30 ENCOUNTER — Ambulatory Visit
Admission: RE | Admit: 2024-03-30 | Discharge: 2024-03-30 | Disposition: A | Discharge status: Home / Self Care | Source: Ambulatory Visit | Attending: Neurosurgery | Admitting: Neurosurgery

## 2024-03-30 DIAGNOSIS — M4316 Spondylolisthesis, lumbar region: Secondary | ICD-10-CM

## 2024-03-30 MED ORDER — GADOPICLENOL 0.5 MMOL/ML IV SOLN
10.0000 mL | Freq: Once | INTRAVENOUS | Status: AC | PRN
  Administered 2024-03-30: 10 mL via INTRAVENOUS

## 2024-03-30 MED ORDER — GADOPICLENOL 0.5 MMOL/ML IV SOLN: 10.0000 mL | Freq: Once | INTRAVENOUS | Status: DC | PRN

## 2024-05-25 ENCOUNTER — Other Ambulatory Visit: Payer: Self-pay | Admitting: Neurosurgery

## 2024-05-25 ENCOUNTER — Encounter: Payer: Self-pay | Admitting: Neurosurgery

## 2024-05-25 ENCOUNTER — Ambulatory Visit
Admission: RE | Admit: 2024-05-25 | Discharge: 2024-05-25 | Disposition: A | Discharge status: Home / Self Care | Source: Ambulatory Visit | Attending: Neurosurgery | Admitting: Neurosurgery

## 2024-05-25 ENCOUNTER — Ambulatory Visit (HOSPITAL_BASED_OUTPATIENT_CLINIC_OR_DEPARTMENT_OTHER): Admitting: Cardiology

## 2024-05-25 DIAGNOSIS — M4316 Spondylolisthesis, lumbar region: Secondary | ICD-10-CM

## 2024-06-13 ENCOUNTER — Other Ambulatory Visit (HOSPITAL_COMMUNITY): Payer: Self-pay | Admitting: Neurosurgery

## 2024-06-13 DIAGNOSIS — M4316 Spondylolisthesis, lumbar region: Secondary | ICD-10-CM

## 2024-06-23 ENCOUNTER — Ambulatory Visit (HOSPITAL_COMMUNITY)
Admission: RE | Admit: 2024-06-23 | Discharge: 2024-06-23 | Disposition: A | Discharge status: Home / Self Care | Source: Ambulatory Visit | Attending: Neurosurgery | Admitting: Neurosurgery

## 2024-06-23 ENCOUNTER — Other Ambulatory Visit: Payer: Self-pay

## 2024-06-23 DIAGNOSIS — M4316 Spondylolisthesis, lumbar region: Secondary | ICD-10-CM

## 2024-06-23 DIAGNOSIS — M549 Dorsalgia, unspecified: Secondary | ICD-10-CM | POA: Insufficient documentation

## 2024-06-23 LAB — GLUCOSE, CAPILLARY: Glucose-Capillary: 104 mg/dL — ABNORMAL HIGH (ref 70–99)

## 2024-06-23 MED ORDER — DIAZEPAM 5 MG PO TABS: 5.0000 mg | ORAL_TABLET | Freq: Once | ORAL | Status: AC

## 2024-06-23 MED ORDER — OXYCODONE HCL 5 MG PO TABS
5.0000 mg | ORAL_TABLET | ORAL | Status: DC | PRN
  Administered 2024-06-23: 10 mg via ORAL
  Filled 2024-06-23: qty 2

## 2024-06-23 MED ORDER — LIDOCAINE HCL (PF) 1 % IJ SOLN
5.0000 mL | Freq: Once | INTRAMUSCULAR | Status: AC
  Administered 2024-06-23: 3 mL via INTRADERMAL

## 2024-06-23 MED ORDER — IOHEXOL 180 MG/ML  SOLN
20.0000 mL | Freq: Once | INTRAMUSCULAR | Status: AC | PRN
  Administered 2024-06-23: 20 mL via INTRATHECAL

## 2024-06-23 MED ORDER — LIDOCAINE HCL (PF) 1 % IJ SOLN
5.0000 mL | Freq: Once | INTRAMUSCULAR | Status: AC
  Administered 2024-06-23: 5 mL via INTRADERMAL

## 2024-06-23 MED ORDER — ONDANSETRON HCL 4 MG/2ML IJ SOLN: 4.0000 mg | Freq: Four times a day (QID) | INTRAMUSCULAR | Status: DC | PRN

## 2024-06-23 MED ORDER — DIAZEPAM 5 MG PO TABS
ORAL_TABLET | ORAL | Status: AC
  Administered 2024-06-23: 5 mg via ORAL
  Filled 2024-06-23: qty 1

## 2024-06-23 NOTE — Op Note (Signed)
*   No surgery found * lumbar Myelogram  PATIENT:  Luke Larsen is a 68 y.o. male With persistent back and lower extremity pain PRE-OPERATIVE DIAGNOSIS:  back pain   POST-OPERATIVE DIAGNOSIS:  back pain  PROCEDURE:  Lumbar Myelogram  SURGEON:  Yarnell Kozloski  ANESTHESIA:   local LOCAL MEDICATIONS USED:  LIDOCAINE   Procedure Note: Luke Larsen is a 68 y.o. male Was taken to the fluoroscopy suite and  positioned prone on the fluoroscopy table. His back was prepared and draped in a sterile manner. I infiltrated 11 cc lidocaine  into the lumbar region. I then introduced a spinal needle into the thecal sac at the L4/5 interlaminar space. I infiltrated 20cc of Isovue  180 into the thecal sac. Fluoroscopy showed the needle and contrast in the thecal sac. Luke Larsen tolerated the procedure well. he Will be taken to CT for evaluation.     PATIENT DISPOSITION:  Short Stay

## 2024-07-03 NOTE — Therapy (Unsigned)
 OUTPATIENT PHYSICAL THERAPY LOWER EXTREMITY EVALUATION   Patient Name: Hilman Kissling MRN: [id number] DOB:[birth year], 68 y.o., male Today's Date: 07/05/2024  END OF SESSION:  PT End of Session - 07/04/24 1644     Visit Number 1    Number of Visits 12    Date for PT Re-Evaluation 08/29/24    Authorization Type [employer]    PT Start Time 1315    PT Stop Time 1400    PT Time Calculation (min) 45 min    Activity Tolerance Patient tolerated treatment well    Behavior During Therapy WFL for tasks assessed/performed          Past Medical History:  Diagnosis Date   Arthritis    Back pain    Constipation    Diabetes mellitus without complication (HCC)    Dyspnea    Gout    Hip pain    History of kidney stones    Hypercholesterolemia    Hypertension    Joint pain    Lower extremity edema    Neck pain    Shoulder pain    Sleep apnea    Vitamin D  deficiency    Past Surgical History:  Procedure Laterality Date   ANTERIOR LAT LUMBAR FUSION Left 12/05/2020   Procedure: LEFT LATERAL INTERBODY FUSION LUMBAR 2 - LUMBAR 3 WITH INSTRUMENTATION AND ALLOGRAFT;  Surgeon: Beuford Anes, MD;  Location: [facility] OR;  Service: Orthopedics;  Laterality: Left;   ANTERIOR LAT LUMBAR FUSION Right 09/10/2021   Procedure: RIGHT LATERAL LUMBAR FUSION LUMBAR 3- LUMBAR 4 WITH INSTRUMENTATION AND ALLOGRAFT;  Surgeon: Beuford Anes, MD;  Location: [facility] OR;  Service: Orthopedics;  Laterality: Right;   BACK SURGERY     04/2019   FRACTURE SURGERY Left 1972   knee   KIDNEY STONE SURGERY  03/28/2012   POSTERIOR FUSION PEDICLE SCREW PLACEMENT N/A 12/10/2023   Procedure: PLIF - L1-L2 - Posterior Lateral and Interbody fusion - T10 - L2;  Surgeon: Gillie Duncans, MD;  Location: [facility] OR;  Service: Neurosurgery;  Laterality: N/A;  3C   Patient Active Problem List   Diagnosis Date Noted   Lumbar adjacent segment disease with spondylolisthesis 12/10/2023   Spinal stenosis 09/10/2021   Radiculopathy 12/05/2020   Facet  arthropathy, lumbar 03/29/2019   Degenerative spondylolisthesis 03/29/2019   Chronic right-sided low back pain with right-sided sciatica 03/29/2019   Apnea, sleep 05/19/2014   Calculus of left ureter 05/19/2014   ED (erectile dysfunction) of organic origin 02/28/2014   Anxiety state 04/19/2013   Obesity, Class III, BMI 40-49.9 (morbid obesity) 04/19/2013   Gastric ulcer by EGD 04/19/2013   History of colonic polyps 04/19/2013   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  Did help the pain but didn't fix.  Had a myelogram 2 weeks ago. Didn't find anything.  Had steroid shot but did not touch my pain.  Nothing helps my pain if I am moving. If I recline it will decrease after a while but doesn't  go away. Cabbell said he needs to converse with colleagues and get back to me he doesn't know what to do. Pt has a 3/4 inch off set from L>R due to injury in football.  I want you to work me I don't care how much it works so I get stronger and my pain reduces  PERTINENT HISTORY: Lumbar spondylolisthesis Lumbar fusions 2022 PLIF L1-2 11/2023 PAIN:  Are you having pain? Yes: NPRS scale: current 8/10; max 10/10; min 2-3/10 Pain location: ache, numbness right leg sometimes into the foot Pain description: starts in middle back , longer I up it migrates up back and down to feet Aggravating factors: movement 5 minutes Relieving factors: recliner  PRECAUTIONS: None  RED  FLAGS: None   WEIGHT BEARING RESTRICTIONS: No  FALLS:  Has patient fallen in last 6 months? No  LIVING ENVIRONMENT: Lives with: lives with their spouse Lives in: House/apartment Stairs: Yes: Internal: 5 steps; on right going up and External: 12 steps; bilateral but cannot reach both Has following equipment at home: None   OCCUPATION: Transport planner    PLOF: Independent  PATIENT GOALS: build strength in core to reduce pain  NEXT MD VISIT: as needed  OBJECTIVE:  Note: Objective measures were completed at Evaluation unless otherwise noted.  DIAGNOSTIC FINDINGS: MRI lumbar spine IMPRESSION: 1. Posterior instrumented fusion at L1-2 and L3-4 with interbody spacers. Mild lucency around the left L1 pedicle screw concerning for hardware loosening. Mild subsidence around the L3-4 interbody spacer. 2. Solid osseous fusion across the disc spaces at L1-2, L2-3, and L3-4. 3. Degenerative changes as above. No high-grade spinal canal stenosis. Moderate foraminal stenosis on the left at L5-S1.  PATIENT SURVEYS:  ODI 29/50=58%  COGNITION: Overall cognitive status: Within functional limits for tasks assessed     SENSATION: WFL    POSTURE: rounded shoulders, forward head, decreased lumbar lordosis, left pelvic obliquity, and flexed trunk guarded  PALPATION: Moderate TTP throughout right lumbar paraspinals   LOWER EXTREMITY ROM:  Active ROM Right eval Left eval  Hip flexion Limited 50% P! Limited 50% P!  Hip extension    Hip abduction    Hip adduction    Hip internal rotation    Hip external rotation    Knee flexion    Knee extension    Ankle dorsiflexion    Ankle plantarflexion    Ankle inversion    Ankle eversion     (Blank rows = not tested)  LOWER EXTREMITY MMT:  MMT Right eval Left eval  Hip flexion 3+ 4-  Hip extension    Hip abduction 5 5  Hip adduction    Hip internal rotation    Hip external rotation    Knee flexion 5 5  Knee extension 5 5  Ankle  dorsiflexion 5 5  Ankle plantarflexion    Ankle inversion    Ankle eversion     (Blank rows = not tested)  LOWER EXTREMITY SPECIAL TESTS:  Not tolerated  FUNCTIONAL TESTS:  Not tolerated  GAIT: Distance walked: 400 ft Assistive device utilized: None Level of assistance: Complete Independence Comments: antalgic  TREATMENT  Eval Self care:Posture and Optometrist instruction. Use of AD; Pain management; activity moderation    PATIENT EDUCATION:  Education details: Discussed eval findings, rehab rationale, aquatic program progression/POC and pools in area. Patient is in agreement  Person educated: Patient Education method: Explanation Education comprehension: verbalized understanding  HOME EXERCISE PROGRAM: [id number]   ASSESSMENT:  CLINICAL IMPRESSION: Patient is a 69 y.o. m who was seen today for physical therapy evaluation and treatment for R hip pain which is  a chronic condition originating with LB dysfunction.  He is well know to this clinic as was seen prior to most recent lumbar fusion L1-2 in Feb 2025. He has had 2 other lumbar fusions back in 2022.  Pt states his pain is better than it was prior to surgery but continues to be high and limiting all function. Testing (limited toleration) and subjective information demonstrates strength, balance and activity tolerance deficits. He is having to modify and restrict all ADL's. He is a good candidate for aquatic intervention and will benefit from the properties of water to progress towards functional goals.     OBJECTIVE IMPAIRMENTS: decreased activity tolerance, difficulty walking, decreased balance, decreased endurance, decreased mobility, decreased ROM, decreased strength, impaired flexibility, impaired UE/LE use, postural dysfunction, and pain.   ACTIVITY LIMITATIONS: bending, lifting, carry,  locomotion, cleaning, community activity, driving, and or occupation   PERSONAL FACTORS: Arthritis, Back pain, DM, Dyspnea, Gout, Multiple joint pain, LE edema, sleep apnea are also affecting patient's functional outcome.   REHAB POTENTIAL: Good   CLINICAL DECISION MAKING: Evolving/moderate complexity   EVALUATION COMPLEXITY: Moderate   GOALS: Goals reviewed with patient? Yes  SHORT TERM GOALS: Target date: 10/10 Pt will tolerate full aquatic sessions consistently without increase in pain and with improving function to demonstrate good toleration and effectiveness of intervention.  Baseline: Goal status: INITIAL  2.  Pt will have a reduction of LB/hip pain by 50% submerged for evidence of water properties effect on chronic pain cycle. Baseline:  Goal status: INITIAL    LONG TERM GOALS: Target date: 08/29/24  Pt to improve on ODI by 13% to demonstrate statistically significant Improvement in function. (MCID 13-15%) Baseline: ODI 29/50=58% Goal status: INITIAL  2.  Pt will improve lumbar ROM to 50% to improve functional mobility Baseline:see chart Goal status: INITIAL   3.  Pt will report decrease in pain by at least 50% for improved toleration to activity/quality of life and to demonstrate improved management of pain. Baseline: see chart Goal status: INITIAL  4.  Pt will improve strength in hip flex by 1 grade to demonstrate improved overall physical function Baseline: see chart Goal status: INITIAL  5.  Pt will report an increase in ability to transfer in and out of bed and chair Baseline: difficulty Goal status: INITIAL    PLAN:  PT FREQUENCY: 1-2x/week  PT DURATION: 8 weeks extended out 2 weeks due to scheduling conflicts. Re-assess at 12 visits to determine land intervention  PLANNED INTERVENTIONS: 97164- PT Re-evaluation, 97750- Physical Performance Testing, 97110-Therapeutic exercises, 97530- Therapeutic activity, 97112- Neuromuscular re-education, [phone]-  Self Care, 02859- Manual therapy, [phone]- Gait training, [phone]- Aquatic Therapy, [phone] (1-2 muscles), 20561 (3+ muscles)- Dry Needling, Patient/Family education, Balance training, Stair training, Taping, Joint mobilization, DME instructions, Cryotherapy, and Moist heat  PLAN FOR NEXT SESSION: aquatics initially:LE/core strengthening and stretching; pain management   Ronal Foots) Maurianna Benard MPT 07/05/24 8:23 AM [facility] [address] [place], [place], [address] Phone: [phone]  Fax:  [phone]  Referring diagnosis? M25.551 (ICD-10-CM) - Pain in right hip  Treatment diagnosis? (if different than referring diagnosis) other low back pain added  What was this (referring dx) caused by? []  Surgery []  Fall [x]  Ongoing issue [x]  Arthritis []  Other: ____________  Laterality: []  Rt []  Lt [x]  Both  Check all possible CPT codes:  *CHOOSE 10 OR LESS*    See Planned Interventions listed in the Plan section of the Evaluation.

## 2024-07-04 ENCOUNTER — Encounter (HOSPITAL_BASED_OUTPATIENT_CLINIC_OR_DEPARTMENT_OTHER): Payer: Self-pay | Admitting: Physical Therapy

## 2024-07-04 ENCOUNTER — Ambulatory Visit (HOSPITAL_BASED_OUTPATIENT_CLINIC_OR_DEPARTMENT_OTHER): Attending: Neurosurgery | Admitting: Physical Therapy

## 2024-07-04 ENCOUNTER — Other Ambulatory Visit: Payer: Self-pay

## 2024-07-04 DIAGNOSIS — M5459 Other low back pain: Secondary | ICD-10-CM | POA: Diagnosis present

## 2024-07-04 DIAGNOSIS — M6281 Muscle weakness (generalized): Secondary | ICD-10-CM | POA: Insufficient documentation

## 2024-07-04 DIAGNOSIS — R252 Cramp and spasm: Secondary | ICD-10-CM | POA: Insufficient documentation

## 2024-07-04 DIAGNOSIS — M25551 Pain in right hip: Secondary | ICD-10-CM | POA: Insufficient documentation

## 2024-07-26 ENCOUNTER — Ambulatory Visit (HOSPITAL_BASED_OUTPATIENT_CLINIC_OR_DEPARTMENT_OTHER): Attending: Neurosurgery | Admitting: Physical Therapy

## 2024-07-26 ENCOUNTER — Encounter (HOSPITAL_BASED_OUTPATIENT_CLINIC_OR_DEPARTMENT_OTHER): Payer: Self-pay | Admitting: Physical Therapy

## 2024-07-26 DIAGNOSIS — R252 Cramp and spasm: Secondary | ICD-10-CM | POA: Insufficient documentation

## 2024-07-26 DIAGNOSIS — M6281 Muscle weakness (generalized): Secondary | ICD-10-CM | POA: Diagnosis present

## 2024-07-26 DIAGNOSIS — M25551 Pain in right hip: Secondary | ICD-10-CM | POA: Diagnosis present

## 2024-07-26 DIAGNOSIS — M5459 Other low back pain: Secondary | ICD-10-CM | POA: Insufficient documentation

## 2024-07-26 NOTE — Therapy (Signed)
 OUTPATIENT PHYSICAL THERAPY LOWER EXTREMITY TREATMENT   Patient Name: Luke Larsen MRN: [id number] DOB:[birth year], 68 y.o., male Today's Date: 07/26/2024  END OF SESSION:  PT End of Session - 07/26/24 0810     Visit Number 2    Number of Visits 12    Date for Recertification  08/29/24    Authorization Type [employer]    PT Start Time 0802    PT Stop Time 0845    PT Time Calculation (min) 43 min    Activity Tolerance Patient tolerated treatment well    Behavior During Therapy WFL for tasks assessed/performed          Past Medical History:  Diagnosis Date   Arthritis    Back pain    Constipation    Diabetes mellitus without complication (HCC)    Dyspnea    Gout    Hip pain    History of kidney stones    Hypercholesterolemia    Hypertension    Joint pain    Lower extremity edema    Neck pain    Shoulder pain    Sleep apnea    Vitamin D  deficiency    Past Surgical History:  Procedure Laterality Date   ANTERIOR LAT LUMBAR FUSION Left 12/05/2020   Procedure: LEFT LATERAL INTERBODY FUSION LUMBAR 2 - LUMBAR 3 WITH INSTRUMENTATION AND ALLOGRAFT;  Surgeon: Beuford Anes, MD;  Location: [facility] OR;  Service: Orthopedics;  Laterality: Left;   ANTERIOR LAT LUMBAR FUSION Right 09/10/2021   Procedure: RIGHT LATERAL LUMBAR FUSION LUMBAR 3- LUMBAR 4 WITH INSTRUMENTATION AND ALLOGRAFT;  Surgeon: Beuford Anes, MD;  Location: [facility] OR;  Service: Orthopedics;  Laterality: Right;   BACK SURGERY     04/2019   FRACTURE SURGERY Left 1972   knee   KIDNEY STONE SURGERY  03/28/2012   POSTERIOR FUSION PEDICLE SCREW PLACEMENT N/A 12/10/2023   Procedure: PLIF - L1-L2 - Posterior Lateral and Interbody fusion - T10 - L2;  Surgeon: Gillie Duncans, MD;  Location: [facility] OR;  Service: Neurosurgery;  Laterality: N/A;  3C   Patient Active Problem List   Diagnosis Date Noted   Lumbar adjacent segment disease with spondylolisthesis 12/10/2023   Spinal stenosis 09/10/2021   Radiculopathy 12/05/2020   Facet  arthropathy, lumbar 03/29/2019   Degenerative spondylolisthesis 03/29/2019   Chronic right-sided low back pain with right-sided sciatica 03/29/2019   Apnea, sleep 05/19/2014   Calculus of left ureter 05/19/2014   ED (erectile dysfunction) of organic origin 02/28/2014   Anxiety state 04/19/2013   Obesity, Class III, BMI 40-49.9 (morbid obesity) (HCC) 04/19/2013   Gastric ulcer by EGD 04/19/2013   History of colonic polyps 04/19/2013   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  Did help the pain but didn't fix.  Had a myelogram 2 weeks ago. Didn't find anything.  Had steroid shot but did not touch my pain.  Nothing helps my pain if I am moving. If I recline it will decrease after  a while but doesn't go away. Cabbell said he needs to converse with colleagues and get back to me he doesn't know what to do. Pt has a 3/4 inch off set from L>R due to injury in football.  I want you to work me I don't care how much it works so I get stronger and my pain reduces  PERTINENT HISTORY: Lumbar spondylolisthesis Lumbar fusions 2022 PLIF L1-2 11/2023 PAIN:  Are you having pain? Yes: NPRS scale: current 8/10; max 10/10; min 2-3/10 Pain location: ache, numbness right leg sometimes into the foot Pain description: starts in middle back , longer I up it migrates up back and down to feet Aggravating factors: movement 5 minutes Relieving factors: recliner  PRECAUTIONS:  None  RED FLAGS: None   WEIGHT BEARING RESTRICTIONS: No  FALLS:  Has patient fallen in last 6 months? No  LIVING ENVIRONMENT: Lives with: lives with their spouse Lives in: House/apartment Stairs: Yes: Internal: 5 steps; on right going up and External: 12 steps; bilateral but cannot reach both Has following equipment at home: None   OCCUPATION: Transport planner    PLOF: Independent  PATIENT GOALS: build strength in core to reduce pain  NEXT MD VISIT: as needed  OBJECTIVE:  Note: Objective measures were completed at Evaluation unless otherwise noted.  DIAGNOSTIC FINDINGS: MRI lumbar spine IMPRESSION: 1. Posterior instrumented fusion at L1-2 and L3-4 with interbody spacers. Mild lucency around the left L1 pedicle screw concerning for hardware loosening. Mild subsidence around the L3-4 interbody spacer. 2. Solid osseous fusion across the disc spaces at L1-2, L2-3, and L3-4. 3. Degenerative changes as above. No high-grade spinal canal stenosis. Moderate foraminal stenosis on the left at L5-S1.  PATIENT SURVEYS:  ODI 29/50=58%  COGNITION: Overall cognitive status: Within functional limits for tasks assessed     SENSATION: WFL    POSTURE: rounded shoulders, forward head, decreased lumbar lordosis, left pelvic obliquity, and flexed trunk guarded  PALPATION: Moderate TTP throughout right lumbar paraspinals   LOWER EXTREMITY ROM:  Active ROM Right eval Left eval  Hip flexion Limited 50% P! Limited 50% P!  Hip extension    Hip abduction    Hip adduction    Hip internal rotation    Hip external rotation    Knee flexion    Knee extension    Ankle dorsiflexion    Ankle plantarflexion    Ankle inversion    Ankle eversion     (Blank rows = not tested)  LOWER EXTREMITY MMT:  MMT Right eval Left eval  Hip flexion 3+ 4-  Hip extension    Hip abduction 5 5  Hip adduction    Hip internal rotation    Hip external rotation    Knee flexion 5 5  Knee extension 5  5  Ankle dorsiflexion 5 5  Ankle plantarflexion    Ankle inversion    Ankle eversion     (Blank rows = not tested)  LOWER EXTREMITY SPECIAL TESTS:  Not tolerated  FUNCTIONAL TESTS:  Not tolerated  GAIT: Distance walked: 400 ft Assistive device utilized: None Level of assistance: Complete Independence Comments: antalgic  TREATMENT  [facility] Adult PT Treatment:                                                DATE: 07/26/24 Pt seen for aquatic therapy today.  Treatment took place in water 3.5-4.75 ft in depth at the [facility] pool. Temp of water was 91.  Pt entered/exited the pool via stairs using step to pattern with hand rail.   *walking forward, back and side stepping in 3.6 ft with unsupported *decompression position on large white noodle wrapped anteriorly across chest: cycling; hip add/abd; flex/ext *L stretch in 4.3 ft *BKTC at ladder feet in first rung this feels good *Yellow then Blue HB press wide stance x 10 reps *red bell resisted arm swing for core engagement wide stance 3 sets of 10 slow/8 fast; staggered stance 1 set of 10 slow/8 fast. *walking between exercises for recovery  Pt requires the buoyancy and hydrostatic pressure of water for support, and to offload joints by unweighting joint load by at least 50 % in navel deep water and by at least 75-80% in chest to neck deep water.  Viscosity of the water is needed for resistance of strengthening. Water current perturbations provides challenge to standing balance requiring increased core activation.       PATIENT EDUCATION:  Education details: Discussed eval findings, rehab rationale, aquatic program progression/POC and pools in area. Patient is in agreement  Person educated: Patient Education method: Explanation Education comprehension: verbalized understanding  HOME EXERCISE  PROGRAM: [email or web address]   ASSESSMENT:  CLINICAL IMPRESSION: Pt demonstrates safety and independence in aquatic setting with therapist instructing from deck. He is confident in setting, moving throughout all depths easily. Majority of time spent in deep water as pt tall and requires depth for adequate buoyancy for reduced pain and muscle engagement. Was able to gain pain reducing decompression position using white noodle and as well as with forward trunk flex with L stretch and knees to chest. Trialed use of red bells for toleration engaging post core with good response.  He is provided VC and demonstration throughout session for execution of exercises and monitoring toleration.  Goals are ongoing.     Initial Impression Patient is a 68 y.o. m who was seen today for physical therapy evaluation and treatment for R hip pain which is  a chronic condition originating with LB dysfunction.  He is well know to this clinic as was seen prior to most recent lumbar fusion L1-2 in Feb 2025. He has had 2 other lumbar fusions back in 2022.  Pt states his pain is better than it was prior to surgery but continues to be high and limiting all function. Testing (limited toleration) and subjective information demonstrates strength, balance and activity tolerance deficits. He is having to modify and restrict all ADL's. He is a good candidate for aquatic intervention and will benefit from the properties of water to progress towards functional goals.     OBJECTIVE IMPAIRMENTS: decreased activity tolerance, difficulty walking, decreased balance, decreased endurance, decreased mobility, decreased ROM, decreased strength, impaired flexibility, impaired UE/LE use, postural dysfunction, and pain.   ACTIVITY LIMITATIONS: bending, lifting, carry, locomotion, cleaning, community activity, driving, and or occupation   PERSONAL FACTORS: Arthritis, Back pain, DM, Dyspnea, Gout, Multiple joint pain, LE edema, sleep apnea are also  affecting patient's functional outcome.   REHAB POTENTIAL: Good  CLINICAL DECISION MAKING: Evolving/moderate complexity   EVALUATION COMPLEXITY: Moderate   GOALS: Goals reviewed with patient? Yes  SHORT TERM GOALS: Target date: 10/10 Pt will tolerate full aquatic sessions consistently without increase in pain and with improving function to demonstrate good toleration and effectiveness of intervention.  Baseline: Goal status: INITIAL  2.  Pt will have a reduction of LB/hip pain by 50% submerged for evidence of water properties effect on chronic pain cycle. Baseline:  Goal status: INITIAL    LONG TERM GOALS: Target date: 08/29/24  Pt to improve on ODI by 13% to demonstrate statistically significant Improvement in function. ([facility] 13-15%) Baseline: ODI 29/50=58% Goal status: INITIAL  2.  Pt will improve lumbar ROM to 50% to improve functional mobility Baseline:see chart Goal status: INITIAL   3.  Pt will report decrease in pain by at least 50% for improved toleration to activity/quality of life and to demonstrate improved management of pain. Baseline: see chart Goal status: INITIAL  4.  Pt will improve strength in hip flex by 1 grade to demonstrate improved overall physical function Baseline: see chart Goal status: INITIAL  5.  Pt will report an increase in ability to transfer in and out of bed and chair Baseline: difficulty Goal status: INITIAL    PLAN:  PT FREQUENCY: 1-2x/week  PT DURATION: 8 weeks extended out 2 weeks due to scheduling conflicts. Re-assess at 12 visits to determine land intervention  PLANNED INTERVENTIONS: 97164- PT Re-evaluation, 97750- Physical Performance Testing, 97110-Therapeutic exercises, 97530- Therapeutic activity, 97112- Neuromuscular re-education, [phone]- Self Care, 02859- Manual therapy, [phone]- Gait training, [phone]- Aquatic Therapy, [phone] (1-2 muscles), [id number] (3+ muscles)- Dry Needling, Patient/Family education, Balance training, Stair  training, Taping, Joint mobilization, DME instructions, Cryotherapy, and Moist heat  PLAN FOR NEXT SESSION: aquatics initially:LE/core strengthening and stretching; pain management   Ronal Foots) Keydi Giel MPT 07/26/24 9:42 AM [facility] [address] [place], [place], [address] Phone: [phone]   Fax:  [phone]  Referring diagnosis? M25.551 (ICD-10-CM) - Pain in right hip  Treatment diagnosis? (if different than referring diagnosis) other low back pain added  What was this (referring dx) caused by? []  Surgery []  Fall [x]  Ongoing issue [x]  Arthritis []  Other: ____________  Laterality: []  Rt []  Lt [x]  Both  Check all possible CPT codes:  *CHOOSE 10 OR LESS*    See Planned Interventions listed in the Plan section of the Evaluation.

## 2024-07-28 ENCOUNTER — Ambulatory Visit (HOSPITAL_BASED_OUTPATIENT_CLINIC_OR_DEPARTMENT_OTHER): Admitting: Physical Therapy

## 2024-07-28 DIAGNOSIS — M5459 Other low back pain: Secondary | ICD-10-CM | POA: Diagnosis not present

## 2024-07-28 DIAGNOSIS — M25551 Pain in right hip: Secondary | ICD-10-CM

## 2024-07-28 DIAGNOSIS — M6281 Muscle weakness (generalized): Secondary | ICD-10-CM

## 2024-07-28 NOTE — Therapy (Signed)
 OUTPATIENT PHYSICAL THERAPY LOWER EXTREMITY TREATMENT   Patient Name: Luke Larsen MRN: [id number] DOB:May 13, 1956, 68 y.o., male Today's Date: 07/28/2024  END OF SESSION:  PT End of Session - 07/28/24 1443     Visit Number 3    Number of Visits 12    Date for Recertification  08/29/24    Authorization Type [employer]    PT Start Time 1430    PT Stop Time 1508    PT Time Calculation (min) 38 min    Activity Tolerance Patient tolerated treatment well    Behavior During Therapy WFL for tasks assessed/performed          Past Medical History:  Diagnosis Date   Arthritis    Back pain    Constipation    Diabetes mellitus without complication (HCC)    Dyspnea    Gout    Hip pain    History of kidney stones    Hypercholesterolemia    Hypertension    Joint pain    Lower extremity edema    Neck pain    Shoulder pain    Sleep apnea    Vitamin D  deficiency    Past Surgical History:  Procedure Laterality Date   ANTERIOR LAT LUMBAR FUSION Left 12/05/2020   Procedure: LEFT LATERAL INTERBODY FUSION LUMBAR 2 - LUMBAR 3 WITH INSTRUMENTATION AND ALLOGRAFT;  Surgeon: Beuford Anes, MD;  Location: [facility];  Service: Orthopedics;  Laterality: Left;   ANTERIOR LAT LUMBAR FUSION Right 09/10/2021   Procedure: RIGHT LATERAL LUMBAR FUSION LUMBAR 3- LUMBAR 4 WITH INSTRUMENTATION AND ALLOGRAFT;  Surgeon: Beuford Anes, MD;  Location: [facility];  Service: Orthopedics;  Laterality: Right;   BACK SURGERY     04/2019   FRACTURE SURGERY Left 1972   knee   KIDNEY STONE SURGERY  03/28/2012   POSTERIOR FUSION PEDICLE SCREW PLACEMENT N/A 12/10/2023   Procedure: PLIF - L1-L2 - Posterior Lateral and Interbody fusion - T10 - L2;  Surgeon: Gillie Duncans, MD;  Location: [facility];  Service: Neurosurgery;  Laterality: N/A;  3C   Patient Active Problem List   Diagnosis Date Noted   Lumbar adjacent segment disease with spondylolisthesis 12/10/2023   Spinal stenosis 09/10/2021   Radiculopathy 12/05/2020   Facet  arthropathy, lumbar 03/29/2019   Degenerative spondylolisthesis 03/29/2019   Chronic right-sided low back pain with right-sided sciatica 03/29/2019   Apnea, sleep 05/19/2014   Calculus of left ureter 05/19/2014   ED (erectile dysfunction) of organic origin 02/28/2014   Anxiety state 04/19/2013   Obesity, Class III, BMI 40-49.9 (morbid obesity) (HCC) 04/19/2013   Gastric ulcer by EGD 04/19/2013   History of colonic polyps 04/19/2013   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  Pt reports a box accidentally hit the back of his leg today when visiting storage unit - small cut; requests dressing on it.   POOL ACCESS: plans to join [employer] at d/c.   Initial Subjective Surgery L1-21 Nov 2023.  Did help  the pain but didn't fix.  Had a myelogram 2 weeks ago. Didn't find anything.  Had steroid shot but did not touch my pain.  Nothing helps my pain if I am moving. If I recline it will decrease after a while but doesn't go away. Cabbell said he needs to converse with colleagues and get back to me he doesn't know what to do. Pt has a 3/4 inch off set from L>R due to injury in football.  I want you to work me I don't care how much it works so I get stronger and my pain reduces  PERTINENT HISTORY: Lumbar spondylolisthesis Lumbar fusions 2022 PLIF L1-2 11/2023 PAIN:  Are you having pain? Yes: NPRS scale: current 7/10;  Pain location: starts in middle back to right  hip  Pain description: ache, numb, cramped  Aggravating factors: movement 5 minutes Relieving factors: recliner  PRECAUTIONS: None  RED FLAGS: None   WEIGHT BEARING RESTRICTIONS: No  FALLS:  Has patient fallen in last 6 months? No  LIVING ENVIRONMENT: Lives with: lives with their spouse Lives in: House/apartment Stairs: Yes: Internal: 5 steps; on right going up and External: 12 steps; bilateral but cannot reach both Has following equipment at home: None   OCCUPATION: Transport planner    PLOF: Independent  PATIENT GOALS: build strength in core to reduce pain  NEXT MD VISIT: as needed  OBJECTIVE:  Note: Objective measures were completed at Evaluation unless otherwise noted.  DIAGNOSTIC FINDINGS: MRI lumbar spine IMPRESSION: 1. Posterior instrumented fusion at L1-2 and L3-4 with interbody spacers. Mild lucency around the left L1 pedicle screw concerning for hardware loosening. Mild subsidence around the L3-4 interbody spacer. 2. Solid osseous fusion across the disc spaces at L1-2, L2-3, and L3-4. 3. Degenerative changes as above. No high-grade spinal canal stenosis. Moderate foraminal stenosis on the left at L5-S1.  PATIENT SURVEYS:  ODI 29/50=58%  COGNITION: Overall cognitive status: Within functional limits for tasks assessed     SENSATION: WFL    POSTURE: rounded shoulders, forward head, decreased lumbar lordosis, left pelvic obliquity, and flexed trunk guarded  PALPATION: Moderate TTP throughout right lumbar paraspinals   LOWER EXTREMITY ROM:  Active ROM Right eval Left eval  Hip flexion Limited 50% P! Limited 50% P!  Hip extension    Hip abduction    Hip adduction    Hip internal rotation    Hip external rotation    Knee flexion    Knee extension    Ankle dorsiflexion    Ankle plantarflexion    Ankle inversion    Ankle eversion     (Blank rows = not tested)  LOWER EXTREMITY MMT:  MMT Right eval Left eval  Hip flexion 3+ 4-  Hip  extension    Hip abduction 5 5  Hip adduction    Hip internal rotation    Hip external rotation    Knee flexion 5 5  Knee extension 5 5  Ankle dorsiflexion 5 5  Ankle plantarflexion    Ankle inversion    Ankle eversion     (Blank rows = not tested)  LOWER EXTREMITY SPECIAL TESTS:  Not tolerated  FUNCTIONAL TESTS:  Not tolerated  GAIT: Distance walked: 400 ft Assistive device utilized: None Level of assistance: Complete Independence Comments: antalgic  TREATMENT  [facility] Adult PT Treatment:                                                DATE: 07/28/24 Pt seen for aquatic therapy today.  Treatment took place in water 3.5-4.75 ft in depth at the [facility] pool. Temp of water was 91.  Pt entered/exited the pool via stairs using step to pattern with hand rail.  *decompression position on large white noodle wrapped anteriorly across chest *unsupported: walking forward, back and side stepping  * white large noodle behind back:  cycling; hip add/abdct in safe range *wide stance: reciprocal arm swing, medium resistance bell with core engagement, varied speed ->horiz abdct/ add in staggered stance; small gentle punches  *BKTC at ladder feet in first rung  *decompression position on large white noodle wrapped anteriorly across chest *TrA set with solid noodle press to thighs in wide stance x 10 reps   Pt requires the buoyancy and hydrostatic pressure of water for support, and to offload joints by unweighting joint load by at least 50 % in navel deep water and by at least 75-80% in chest to neck deep water.  Viscosity of the water is needed for resistance of strengthening. Water current perturbations provides challenge to standing balance requiring increased core activation.       PATIENT EDUCATION:  Education details: reacquainting to aquatic  therapy   Person educated: Patient Education method: Explanation Education comprehension: verbalized understanding  HOME EXERCISE PROGRAM: [id number]   ASSESSMENT:  CLINICAL IMPRESSION: Pt reports 70% reduction of pain when submerged in pool; has met STG#2.  Pt reports pain at 5/10 when walking in water, with more pain in R lower back with side stepping. He gets most relief with vertical suspension in deeper water in between core exercises.  He is provided VC and demonstration throughout session for execution of exercises and monitoring toleration. Pt is progressing towards goals.     Initial Impression Patient is a 69 y.o. m who was seen today for physical therapy evaluation and treatment for R hip pain which is  a chronic condition originating with LB dysfunction.  He is well know to this clinic as was seen prior to most recent lumbar fusion L1-2 in Feb 2025. He has had 2 other lumbar fusions back in 2022.  Pt states his pain is better than it was prior to surgery but continues to be high and limiting all function. Testing (limited toleration) and subjective information demonstrates strength, balance and activity tolerance deficits. He is having to modify and restrict all ADL's. He is a good candidate for aquatic intervention and will benefit from the properties of water to progress towards functional goals.     OBJECTIVE IMPAIRMENTS: decreased activity tolerance, difficulty walking, decreased balance, decreased endurance, decreased mobility, decreased ROM, decreased strength, impaired flexibility, impaired UE/LE use, postural dysfunction, and pain.   ACTIVITY LIMITATIONS: bending, lifting, carry, locomotion, cleaning, community activity, driving, and or occupation   PERSONAL FACTORS: Arthritis, Back pain, DM, Dyspnea, Gout, Multiple joint pain, LE edema, sleep apnea are also affecting patient's functional outcome.   REHAB POTENTIAL: Good   CLINICAL DECISION MAKING: Evolving/moderate  complexity   EVALUATION COMPLEXITY: Moderate   GOALS: Goals reviewed with patient? Yes  SHORT TERM GOALS: Target date: 10/10 Pt will tolerate full aquatic sessions consistently without increase in pain and with improving  function to demonstrate good toleration and effectiveness of intervention.  Baseline: Goal status: INITIAL  2.  Pt will have a reduction of LB/hip pain by 50% submerged for evidence of water properties effect on chronic pain cycle. Baseline:  Goal status:MET - 07/28/24    LONG TERM GOALS: Target date: 08/29/24  Pt to improve on ODI by 13% to demonstrate statistically significant Improvement in function. (MCID 13-15%) Baseline: ODI 29/50=58% Goal status: INITIAL  2.  Pt will improve lumbar ROM to 50% to improve functional mobility Baseline:see chart Goal status: INITIAL   3.  Pt will report decrease in pain by at least 50% for improved toleration to activity/quality of life and to demonstrate improved management of pain. Baseline: see chart Goal status: INITIAL  4.  Pt will improve strength in hip flex by 1 grade to demonstrate improved overall physical function Baseline: see chart Goal status: INITIAL  5.  Pt will report an increase in ability to transfer in and out of bed and chair Baseline: difficulty Goal status: INITIAL    PLAN:  PT FREQUENCY: 1-2x/week  PT DURATION: 8 weeks extended out 2 weeks due to scheduling conflicts. Re-assess at 12 visits to determine land intervention  PLANNED INTERVENTIONS: 97164- PT Re-evaluation, 97750- Physical Performance Testing, 97110-Therapeutic exercises, 97530- Therapeutic activity, 97112- Neuromuscular re-education, [phone]- Self Care, 02859- Manual therapy, [phone]- Gait training, [phone]- Aquatic Therapy, [phone] (1-2 muscles), 20561 (3+ muscles)- Dry Needling, Patient/Family education, Balance training, Stair training, Taping, Joint mobilization, DME instructions, Cryotherapy, and Moist heat  PLAN FOR NEXT  SESSION: aquatics initially:LE/core strengthening and stretching; pain management  Delon Aquas, PTA 07/28/24 3:10 PM [facility] [address] [place], [place], [address] Phone: [phone]   Fax:  [phone]   Referring diagnosis? M25.551 (ICD-10-CM) - Pain in right hip  Treatment diagnosis? (if different than referring diagnosis) other low back pain added  What was this (referring dx) caused by? []  Surgery []  Fall [x]  Ongoing issue [x]  Arthritis []  Other: ____________  Laterality: []  Rt []  Lt [x]  Both  Check all possible CPT codes:  *CHOOSE 10 OR LESS*    See Planned Interventions listed in the Plan section of the Evaluation.

## 2024-08-01 ENCOUNTER — Ambulatory Visit (HOSPITAL_BASED_OUTPATIENT_CLINIC_OR_DEPARTMENT_OTHER): Admitting: Physical Therapy

## 2024-08-01 DIAGNOSIS — M5459 Other low back pain: Secondary | ICD-10-CM

## 2024-08-01 DIAGNOSIS — M6281 Muscle weakness (generalized): Secondary | ICD-10-CM

## 2024-08-01 DIAGNOSIS — M25551 Pain in right hip: Secondary | ICD-10-CM

## 2024-08-01 NOTE — Therapy (Signed)
 OUTPATIENT PHYSICAL THERAPY LOWER EXTREMITY TREATMENT   Patient Name: Luke Larsen MRN: [id number] DOB:11-07-1955, 68 y.o., male Today's Date: 08/01/2024  END OF SESSION:  PT End of Session - 08/01/24 0941     Visit Number 4    Number of Visits 12    Date for Recertification  08/29/24    Authorization Type [employer]    PT Start Time [phone]    PT Stop Time 1010    PT Time Calculation (min) 38 min    Activity Tolerance Patient tolerated treatment well    Behavior During Therapy WFL for tasks assessed/performed          Past Medical History:  Diagnosis Date   Arthritis    Back pain    Constipation    Diabetes mellitus without complication (HCC)    Dyspnea    Gout    Hip pain    History of kidney stones    Hypercholesterolemia    Hypertension    Joint pain    Lower extremity edema    Neck pain    Shoulder pain    Sleep apnea    Vitamin D  deficiency    Past Surgical History:  Procedure Laterality Date   ANTERIOR LAT LUMBAR FUSION Left 12/05/2020   Procedure: LEFT LATERAL INTERBODY FUSION LUMBAR 2 - LUMBAR 3 WITH INSTRUMENTATION AND ALLOGRAFT;  Surgeon: Beuford Anes, MD;  Location: [facility];  Service: Orthopedics;  Laterality: Left;   ANTERIOR LAT LUMBAR FUSION Right 09/10/2021   Procedure: RIGHT LATERAL LUMBAR FUSION LUMBAR 3- LUMBAR 4 WITH INSTRUMENTATION AND ALLOGRAFT;  Surgeon: Beuford Anes, MD;  Location: [facility];  Service: Orthopedics;  Laterality: Right;   BACK SURGERY     04/2019   FRACTURE SURGERY Left 1972   knee   KIDNEY STONE SURGERY  03/28/2012   POSTERIOR FUSION PEDICLE SCREW PLACEMENT N/A 12/10/2023   Procedure: PLIF - L1-L2 - Posterior Lateral and Interbody fusion - T10 - L2;  Surgeon: Gillie Duncans, MD;  Location: [facility];  Service: Neurosurgery;  Laterality: N/A;  3C   Patient Active Problem List   Diagnosis Date Noted   Lumbar adjacent segment disease with spondylolisthesis 12/10/2023   Spinal stenosis 09/10/2021   Radiculopathy 12/05/2020   Facet  arthropathy, lumbar 03/29/2019   Degenerative spondylolisthesis 03/29/2019   Chronic right-sided low back pain with right-sided sciatica 03/29/2019   Apnea, sleep 05/19/2014   Calculus of left ureter 05/19/2014   ED (erectile dysfunction) of organic origin 02/28/2014   Anxiety state 04/19/2013   Obesity, Class III, BMI 40-49.9 (morbid obesity) (HCC) 04/19/2013   Gastric ulcer by EGD 04/19/2013   History of colonic polyps 04/19/2013   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 I can't just lay in bed.    POOL ACCESS: plans to join [employer] at d/c.   Initial Subjective Surgery L1-21 Nov 2023.  Did help the pain but didn't fix.  Had a myelogram 2 weeks ago. Didn't find anything.  Had steroid  shot but did not touch my pain.  Nothing helps my pain if I am moving. If I recline it will decrease after a while but doesn't go away. Cabbell said he needs to converse with colleagues and get back to me he doesn't know what to do. Pt has a 3/4 inch off set from L>R due to injury in football.  I want you to work me I don't care how much it works so I get stronger and my pain reduces  PERTINENT HISTORY: Lumbar spondylolisthesis Lumbar fusions 2022 PLIF L1-2 11/2023 PAIN:  Are you having pain? Yes: NPRS scale: current 8/10;  Pain location: generalized  Pain description: ache, numb, cramped  Aggravating factors: movement 5 minutes Relieving factors: recliner  PRECAUTIONS:  None  RED FLAGS: None   WEIGHT BEARING RESTRICTIONS: No  FALLS:  Has patient fallen in last 6 months? No  LIVING ENVIRONMENT: Lives with: lives with their spouse Lives in: House/apartment Stairs: Yes: Internal: 5 steps; on right going up and External: 12 steps; bilateral but cannot reach both Has following equipment at home: None   OCCUPATION: Transport planner    PLOF: Independent  PATIENT GOALS: build strength in core to reduce pain  NEXT MD VISIT: as needed  OBJECTIVE:  Note: Objective measures were completed at Evaluation unless otherwise noted.  DIAGNOSTIC FINDINGS: MRI lumbar spine IMPRESSION: 1. Posterior instrumented fusion at L1-2 and L3-4 with interbody spacers. Mild lucency around the left L1 pedicle screw concerning for hardware loosening. Mild subsidence around the L3-4 interbody spacer. 2. Solid osseous fusion across the disc spaces at L1-2, L2-3, and L3-4. 3. Degenerative changes as above. No high-grade spinal canal stenosis. Moderate foraminal stenosis on the left at L5-S1.  PATIENT SURVEYS:  ODI 29/50=58%  COGNITION: Overall cognitive status: Within functional limits for tasks assessed     SENSATION: WFL    POSTURE: rounded shoulders, forward head, decreased lumbar lordosis, left pelvic obliquity, and flexed trunk guarded  PALPATION: Moderate TTP throughout right lumbar paraspinals   LOWER EXTREMITY ROM:  Active ROM Right eval Left eval  Hip flexion Limited 50% P! Limited 50% P!  Hip extension    Hip abduction    Hip adduction    Hip internal rotation    Hip external rotation    Knee flexion    Knee extension    Ankle dorsiflexion    Ankle plantarflexion    Ankle inversion    Ankle eversion     (Blank rows = not tested)  LOWER EXTREMITY MMT:  MMT Right eval Left eval  Hip flexion 3+ 4-  Hip extension    Hip abduction 5 5  Hip adduction    Hip internal rotation    Hip external rotation    Knee flexion 5 5  Knee extension 5  5  Ankle dorsiflexion 5 5  Ankle plantarflexion    Ankle inversion    Ankle eversion     (Blank rows = not tested)  LOWER EXTREMITY SPECIAL TESTS:  Not tolerated  FUNCTIONAL TESTS:  Not tolerated  GAIT: Distance walked: 400 ft Assistive device utilized: None Level of assistance: Complete Independence Comments: antalgic  TREATMENT  [facility] Adult PT Treatment:                                                DATE: 08/01/24 Pt seen for aquatic therapy today.  Treatment took place in water 3.5-4.75 ft in depth at the [facility] pool. Temp of water was 91.  Pt entered/exited the pool via stairs using step to pattern with hand rail.  *decompression position on large white noodle wrapped anteriorly across chest *unsupported: walking forward, back and side stepping for warm up, cues for posture * white large noodle behind back:  cycling * side stepping with arm add/abdct with blue hand floats * squat with blue float x 2-> rainbow x 1 (not tolerated, stopped) *bil knee to chest stretch at ladder feet in first rung  * UE On white noodle: hip abdct/add x 8 each (alternating when needed)-> forward/ backward marching; single leg into hip extension x 20sec x 2 each *TrA set with yellow hand float press to thighs in wide stance x 10 reps   Pt requires the buoyancy and hydrostatic pressure of water for support, and to offload joints by unweighting joint load by at least 50 % in navel deep water and by at least 75-80% in chest to neck deep water.  Viscosity of the water is needed for resistance of strengthening. Water current perturbations provides challenge to standing balance requiring increased core activation.       PATIENT EDUCATION:  Education details: reacquainting to aquatic therapy   Person educated: Patient Education method: Explanation Education  comprehension: verbalized understanding  HOME EXERCISE PROGRAM: [id number]   ASSESSMENT:  CLINICAL IMPRESSION: Pt reports he gets most relief with vertical suspension in deeper water in between core exercises.  Continued focus on core strengthening and improved posture. Pt unable to tolerate resisted squat (blue or rainbow hand float) and Rt hip abdct in Lt SLS, as this spiked his pain; reduced with knee to chest stretch at stairs.  Pt is progressing gradually towards goals.     Initial Impression Patient is a 68 y.o. m who was seen today for physical therapy evaluation and treatment for R hip pain which is  a chronic condition originating with LB dysfunction.  He is well know to this clinic as was seen prior to most recent lumbar fusion L1-2 in Feb 2025. He has had 2 other lumbar fusions back in 2022.  Pt states his pain is better than it was prior to surgery but continues to be high and limiting all function. Testing (limited toleration) and subjective information demonstrates strength, balance and activity tolerance deficits. He is having to modify and restrict all ADL's. He is a good candidate for aquatic intervention and will benefit from the properties of water to progress towards functional goals.     OBJECTIVE IMPAIRMENTS: decreased activity tolerance, difficulty walking, decreased balance, decreased endurance, decreased mobility, decreased ROM, decreased strength, impaired flexibility, impaired UE/LE use, postural dysfunction, and pain.   ACTIVITY LIMITATIONS: bending, lifting, carry, locomotion, cleaning, community activity, driving, and or occupation   PERSONAL FACTORS: Arthritis, Back pain, DM, Dyspnea, Gout, Multiple joint pain, LE edema, sleep apnea are also affecting patient's functional outcome.   REHAB POTENTIAL: Good   CLINICAL DECISION MAKING: Evolving/moderate complexity   EVALUATION COMPLEXITY: Moderate   GOALS: Goals reviewed with patient? Yes  SHORT TERM GOALS:  Target  date: 10/10 Pt will tolerate full aquatic sessions consistently without increase in pain and with improving function to demonstrate good toleration and effectiveness of intervention.  Baseline: Goal status: INITIAL  2.  Pt will have a reduction of LB/hip pain by 50% submerged for evidence of water properties effect on chronic pain cycle. Baseline:  Goal status:MET - 07/28/24    LONG TERM GOALS: Target date: 08/29/24  Pt to improve on ODI by 13% to demonstrate statistically significant Improvement in function. (MCID 13-15%) Baseline: ODI 29/50=58% Goal status: INITIAL  2.  Pt will improve lumbar ROM to 50% to improve functional mobility Baseline:see chart Goal status: INITIAL   3.  Pt will report decrease in pain by at least 50% for improved toleration to activity/quality of life and to demonstrate improved management of pain. Baseline: see chart Goal status: INITIAL  4.  Pt will improve strength in hip flex by 1 grade to demonstrate improved overall physical function Baseline: see chart Goal status: INITIAL  5.  Pt will report an increase in ability to transfer in and out of bed and chair Baseline: difficulty Goal status: INITIAL    PLAN:  PT FREQUENCY: 1-2x/week  PT DURATION: 8 weeks extended out 2 weeks due to scheduling conflicts. Re-assess at 12 visits to determine land intervention  PLANNED INTERVENTIONS: 97164- PT Re-evaluation, 97750- Physical Performance Testing, 97110-Therapeutic exercises, 97530- Therapeutic activity, 97112- Neuromuscular re-education, [phone]- Self Care, 02859- Manual therapy, [phone]- Gait training, [phone]- Aquatic Therapy, [phone] (1-2 muscles), 20561 (3+ muscles)- Dry Needling, Patient/Family education, Balance training, Stair training, Taping, Joint mobilization, DME instructions, Cryotherapy, and Moist heat  PLAN FOR NEXT SESSION: aquatics initially:LE/core strengthening and stretching; pain management  Delon Aquas,  PTA 08/01/24 10:15 AM [facility] [address] [place], [place], [address] Phone: [phone]   Fax:  [phone]   Referring diagnosis? M25.551 (ICD-10-CM) - Pain in right hip  Treatment diagnosis? (if different than referring diagnosis) other low back pain added  What was this (referring dx) caused by? []  Surgery []  Fall [x]  Ongoing issue [x]  Arthritis []  Other: ____________  Laterality: []  Rt []  Lt [x]  Both  Check all possible CPT codes:  *CHOOSE 10 OR LESS*    See Planned Interventions listed in the Plan section of the Evaluation.

## 2024-08-03 ENCOUNTER — Ambulatory Visit (HOSPITAL_BASED_OUTPATIENT_CLINIC_OR_DEPARTMENT_OTHER): Admitting: Physical Therapy

## 2024-08-03 ENCOUNTER — Encounter (HOSPITAL_BASED_OUTPATIENT_CLINIC_OR_DEPARTMENT_OTHER): Payer: Self-pay | Admitting: Physical Therapy

## 2024-08-03 DIAGNOSIS — M25551 Pain in right hip: Secondary | ICD-10-CM

## 2024-08-03 DIAGNOSIS — M5459 Other low back pain: Secondary | ICD-10-CM

## 2024-08-03 DIAGNOSIS — M6281 Muscle weakness (generalized): Secondary | ICD-10-CM

## 2024-08-03 NOTE — Therapy (Signed)
 OUTPATIENT PHYSICAL THERAPY LOWER EXTREMITY TREATMENT   Patient Name: Luke Larsen MRN: [id number] DOB:[birth year], 68 y.o., male Today's Date: 08/03/2024  END OF SESSION:  PT End of Session - 08/03/24 0859     Visit Number 5    Number of Visits 12    Date for Recertification  08/29/24    Authorization Type humana [employer]    PT Start Time [phone]    PT Stop Time 0930    PT Time Calculation (min) 42 min    Activity Tolerance Patient tolerated treatment well    Behavior During Therapy WFL for tasks assessed/performed          Past Medical History:  Diagnosis Date   Arthritis    Back pain    Constipation    Diabetes mellitus without complication (HCC)    Dyspnea    Gout    Hip pain    History of kidney stones    Hypercholesterolemia    Hypertension    Joint pain    Lower extremity edema    Neck pain    Shoulder pain    Sleep apnea    Vitamin D  deficiency    Past Surgical History:  Procedure Laterality Date   ANTERIOR LAT LUMBAR FUSION Left 12/05/2020   Procedure: LEFT LATERAL INTERBODY FUSION LUMBAR 2 - LUMBAR 3 WITH INSTRUMENTATION AND ALLOGRAFT;  Surgeon: Beuford Anes, MD;  Location: [facility] OR;  Service: Orthopedics;  Laterality: Left;   ANTERIOR LAT LUMBAR FUSION Right 09/10/2021   Procedure: RIGHT LATERAL LUMBAR FUSION LUMBAR 3- LUMBAR 4 WITH INSTRUMENTATION AND ALLOGRAFT;  Surgeon: Beuford Anes, MD;  Location: [facility] OR;  Service: Orthopedics;  Laterality: Right;   BACK SURGERY     04/2019   FRACTURE SURGERY Left 1972   knee   KIDNEY STONE SURGERY  03/28/2012   POSTERIOR FUSION PEDICLE SCREW PLACEMENT N/A 12/10/2023   Procedure: PLIF - L1-L2 - Posterior Lateral and Interbody fusion - T10 - L2;  Surgeon: Gillie Duncans, MD;  Location: [facility] OR;  Service: Neurosurgery;  Laterality: N/A;  3C   Patient Active Problem List   Diagnosis Date Noted   Lumbar adjacent segment disease with spondylolisthesis 12/10/2023   Spinal stenosis 09/10/2021   Radiculopathy 12/05/2020   Facet  arthropathy, lumbar 03/29/2019   Degenerative spondylolisthesis 03/29/2019   Chronic right-sided low back pain with right-sided sciatica 03/29/2019   Apnea, sleep 05/19/2014   Calculus of left ureter 05/19/2014   ED (erectile dysfunction) of organic origin 02/28/2014   Anxiety state 04/19/2013   Obesity, Class III, BMI 40-49.9 (morbid obesity) (HCC) 04/19/2013   Gastric ulcer by EGD 04/19/2013   History of colonic polyps 04/19/2013   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 8-9/10  POOL ACCESS: plans to join [employer] at d/c.   Initial Subjective Surgery L1-21 Nov 2023.  Did help the pain but didn't fix.  Had a myelogram 2 weeks ago. Didn't find anything.  Had steroid shot but  did not touch my pain.  Nothing helps my pain if I am moving. If I recline it will decrease after a while but doesn't go away. Cabbell said he needs to converse with colleagues and get back to me he doesn't know what to do. Pt has a 3/4 inch off set from L>R due to injury in football.  I want you to work me I don't care how much it works so I get stronger and my pain reduces  PERTINENT HISTORY: Lumbar spondylolisthesis Lumbar fusions 2022 PLIF L1-2 11/2023 PAIN:  Are you having pain? Yes: NPRS scale: current 8/10;  Pain location: generalized  Pain description: ache, numb, cramped  Aggravating factors: movement 5 minutes Relieving factors:  recliner  PRECAUTIONS: None  RED FLAGS: None   WEIGHT BEARING RESTRICTIONS: No  FALLS:  Has patient fallen in last 6 months? No  LIVING ENVIRONMENT: Lives with: lives with their spouse Lives in: House/apartment Stairs: Yes: Internal: 5 steps; on right going up and External: 12 steps; bilateral but cannot reach both Has following equipment at home: None   OCCUPATION: Transport planner    PLOF: Independent  PATIENT GOALS: build strength in core to reduce pain  NEXT MD VISIT: as needed  OBJECTIVE:  Note: Objective measures were completed at Evaluation unless otherwise noted.  DIAGNOSTIC FINDINGS: MRI lumbar spine IMPRESSION: 1. Posterior instrumented fusion at L1-2 and L3-4 with interbody spacers. Mild lucency around the left L1 pedicle screw concerning for hardware loosening. Mild subsidence around the L3-4 interbody spacer. 2. Solid osseous fusion across the disc spaces at L1-2, L2-3, and L3-4. 3. Degenerative changes as above. No high-grade spinal canal stenosis. Moderate foraminal stenosis on the left at L5-S1.  PATIENT SURVEYS:  ODI 29/50=58%  COGNITION: Overall cognitive status: Within functional limits for tasks assessed     SENSATION: WFL    POSTURE: rounded shoulders, forward head, decreased lumbar lordosis, left pelvic obliquity, and flexed trunk guarded  PALPATION: Moderate TTP throughout right lumbar paraspinals   LOWER EXTREMITY ROM:  Active ROM Right eval Left eval  Hip flexion Limited 50% P! Limited 50% P!  Hip extension    Hip abduction    Hip adduction    Hip internal rotation    Hip external rotation    Knee flexion    Knee extension    Ankle dorsiflexion    Ankle plantarflexion    Ankle inversion    Ankle eversion     (Blank rows = not tested)  LOWER EXTREMITY MMT:  MMT Right eval Left eval  Hip flexion 3+ 4-  Hip extension    Hip abduction 5 5  Hip adduction    Hip internal rotation    Hip external rotation    Knee  flexion 5 5  Knee extension 5 5  Ankle dorsiflexion 5 5  Ankle plantarflexion    Ankle inversion    Ankle eversion     (Blank rows = not tested)  LOWER EXTREMITY SPECIAL TESTS:  Not tolerated  FUNCTIONAL TESTS:  Not tolerated  GAIT: Distance walked: 400 ft Assistive device utilized: None Level of assistance: Complete Independence Comments: antalgic  TREATMENT  [facility] Adult PT Treatment:                                                DATE: 08/03/24 Pt seen for aquatic therapy today.  Treatment took place in water 3.5-4.75 ft in depth at the [facility] pool. Temp of water was 91.  Pt entered/exited the pool via stairs using step to pattern with hand rail.  *decompression position on large white noodle wrapped anteriorly across chest *unsupported: walking forward, back and side stepping for warm up, cues for posture * white large noodle behind back:  cycling * side stepping with arm add/abdct with blue hand floats *bil knee to chest stretch at ladder feet in first rung  * UE On white noodle: toe raises, heel raises x 15e; hip abdct/add 2 x 5 each; hip flex/ext 2 x 5 *TrA set with yellow hand float press to thighs in wide stance x 10 reps Multiple squatted rest periods leaning over white noodle for decompression.   Pt requires the buoyancy and hydrostatic pressure of water for support, and to offload joints by unweighting joint load by at least 50 % in navel deep water and by at least 75-80% in chest to neck deep water.  Viscosity of the water is needed for resistance of strengthening. Water current perturbations provides challenge to standing balance requiring increased core activation.       PATIENT EDUCATION:  Education details: reacquainting to aquatic therapy   Person educated: Patient Education method: Explanation Education  comprehension: verbalized understanding  HOME EXERCISE PROGRAM: [id number]   ASSESSMENT:  CLINICAL IMPRESSION: Pt arrives with high than normal pain sensitivity from standing x 45 mins this am.  He reports immediate reduction of pain by a few NPRS points with vertical suspension/decompression using large white noodle. He is limited with right hip abd due to pain and a knot in right lb/hip area but is able to complete with short rest periods and sets of just 5 reps.  He does require multiple vertical suspended recovery periods throughout exercises and side stepping. Reports overall good toleration to aquatic session with some reduction of pain and no excessive fatigue. PT suggested massage therapist if able to work on muscle tightness throughout LB and hips. Goals ongoing      Initial Impression Patient is a 68 y.o. m who was seen today for physical therapy evaluation and treatment for R hip pain which is  a chronic condition originating with LB dysfunction.  He is well know to this clinic as was seen prior to most recent lumbar fusion L1-2 in Feb 2025. He has had 2 other lumbar fusions back in 2022.  Pt states his pain is better than it was prior to surgery but continues to be high and limiting all function. Testing (limited toleration) and subjective information demonstrates strength, balance and activity tolerance deficits. He is having to modify and restrict all ADL's. He is a good candidate for aquatic intervention and will benefit from the properties of water to progress towards functional goals.     OBJECTIVE IMPAIRMENTS: decreased activity tolerance, difficulty walking, decreased balance, decreased endurance, decreased mobility, decreased ROM, decreased strength, impaired flexibility, impaired UE/LE use, postural dysfunction, and pain.   ACTIVITY LIMITATIONS: bending, lifting, carry, locomotion, cleaning, community activity, driving, and or occupation   PERSONAL FACTORS: Arthritis,  Back pain, DM, Dyspnea,  Gout, Multiple joint pain, LE edema, sleep apnea are also affecting patient's functional outcome.   REHAB POTENTIAL: Good   CLINICAL DECISION MAKING: Evolving/moderate complexity   EVALUATION COMPLEXITY: Moderate   GOALS: Goals reviewed with patient? Yes  SHORT TERM GOALS: Target date: 10/10 Pt will tolerate full aquatic sessions consistently without increase in pain and with improving function to demonstrate good toleration and effectiveness of intervention.  Baseline: Goal status: Met 08/03/24  2.  Pt will have a reduction of LB/hip pain by 50% submerged for evidence of water properties effect on chronic pain cycle. Baseline:  Goal status:MET - 07/28/24    LONG TERM GOALS: Target date: 08/29/24  Pt to improve on ODI by 13% to demonstrate statistically significant Improvement in function. (MCID 13-15%) Baseline: ODI 29/50=58% Goal status: INITIAL  2.  Pt will improve lumbar ROM to 50% to improve functional mobility Baseline:see chart Goal status: INITIAL   3.  Pt will report decrease in pain by at least 50% for improved toleration to activity/quality of life and to demonstrate improved management of pain. Baseline: see chart Goal status: INITIAL  4.  Pt will improve strength in hip flex by 1 grade to demonstrate improved overall physical function Baseline: see chart Goal status: INITIAL  5.  Pt will report an increase in ability to transfer in and out of bed and chair Baseline: difficulty Goal status: INITIAL    PLAN:  PT FREQUENCY: 1-2x/week  PT DURATION: 8 weeks extended out 2 weeks due to scheduling conflicts. Re-assess at 12 visits to determine land intervention  PLANNED INTERVENTIONS: 97164- PT Re-evaluation, 97750- Physical Performance Testing, 97110-Therapeutic exercises, 97530- Therapeutic activity, 97112- Neuromuscular re-education, [phone]- Self Care, 02859- Manual therapy, [phone]- Gait training, [phone]- Aquatic Therapy, [phone] (1-2  muscles), 20561 (3+ muscles)- Dry Needling, Patient/Family education, Balance training, Stair training, Taping, Joint mobilization, DME instructions, Cryotherapy, and Moist heat  PLAN FOR NEXT SESSION: aquatics initially:LE/core strengthening and stretching; pain management  Ronal Foots) Toddrick Sanna MPT 08/03/24 9:51 AM [facility] [address] [place], [place], [address] Phone: [phone]   Fax:  [phone]    Referring diagnosis? M25.551 (ICD-10-CM) - Pain in right hip  Treatment diagnosis? (if different than referring diagnosis) other low back pain added  What was this (referring dx) caused by? []  Surgery []  Fall [x]  Ongoing issue [x]  Arthritis []  Other: ____________  Laterality: []  Rt []  Lt [x]  Both  Check all possible CPT codes:  *CHOOSE 10 OR LESS*    See Planned Interventions listed in the Plan section of the Evaluation.

## 2024-08-07 ENCOUNTER — Encounter (HOSPITAL_BASED_OUTPATIENT_CLINIC_OR_DEPARTMENT_OTHER): Payer: Self-pay

## 2024-08-08 ENCOUNTER — Encounter (HOSPITAL_BASED_OUTPATIENT_CLINIC_OR_DEPARTMENT_OTHER): Payer: Self-pay

## 2024-08-08 ENCOUNTER — Emergency Department (HOSPITAL_BASED_OUTPATIENT_CLINIC_OR_DEPARTMENT_OTHER)
Admission: EM | Admit: 2024-08-08 | Discharge: 2024-08-08 | Disposition: A | Discharge status: Home / Self Care | Attending: Emergency Medicine | Admitting: Emergency Medicine

## 2024-08-08 ENCOUNTER — Ambulatory Visit (HOSPITAL_BASED_OUTPATIENT_CLINIC_OR_DEPARTMENT_OTHER): Admitting: Physical Therapy

## 2024-08-08 ENCOUNTER — Other Ambulatory Visit: Payer: Self-pay

## 2024-08-08 DIAGNOSIS — W228XXA Striking against or struck by other objects, initial encounter: Secondary | ICD-10-CM | POA: Diagnosis not present

## 2024-08-08 DIAGNOSIS — Y9301 Activity, walking, marching and hiking: Secondary | ICD-10-CM | POA: Insufficient documentation

## 2024-08-08 DIAGNOSIS — S81811A Laceration without foreign body, right lower leg, initial encounter: Secondary | ICD-10-CM | POA: Diagnosis not present

## 2024-08-08 DIAGNOSIS — Z87442 Personal history of urinary calculi: Secondary | ICD-10-CM | POA: Insufficient documentation

## 2024-08-08 DIAGNOSIS — S8991XA Unspecified injury of right lower leg, initial encounter: Secondary | ICD-10-CM | POA: Diagnosis present

## 2024-08-08 NOTE — Progress Notes (Signed)
 Cardiology Office Note:  .   Date:  08/09/2024  ID:  Luke Larsen, DOB [birth year], MRN [id number] PCP: Teresa Channel, MD  [facility] Providers Cardiologist:  Shelda Bruckner, MD Cardiology APP:  Vannie Reche RAMAN, NP {  History of Present Illness: Luke   Shaya Larsen is a 68 y.o. male with PMH nonobstructive CAD, hypertension, hyperlipidemia, type II diabetes, morbid obesity, OSA. He was previously followed by Dr. Hobart and established care with me on 08/09/24.  Pertinent CV history: Referred in 2023 for exertional dyspnea.  Echo 02/2022 normal LVEF 5 to 60%, mild LVH, gr1dd, no significant valvular abnormalities.  Coronary CTA 02/2022 calcium  score 559 placing him in the 80th percentile for age/race/sex matched controls with nonobstructive disease to mild plaque in the LCx, LAD, RCA.   Today: No cardiac concerns. Severe back pain limits activity, nothing has helped with this. Limits his activity significantly. Hasn't done enough activity that he has had to push himself, no shortness of breath. Has a hill that he does have to go up and down and isn't short of breath. Has occasional LLE ankle edema.  Has lost a lot of weight on mounjaro, see below. No major side effects.  We discussed lipids and statins, see below.  Reviewed his BP meds today. He is not taking carvedilol , hydralazine, or amlodipine -valsartan . Discussed how pain affects BP. He was considering restarting, but with his good BP on no meds, we discussed risk  ROS: Denies chest pain, shortness of breath at rest or with normal exertion. No PND, orthopnea, or unexpected weight gain. No syncope or palpitations. ROS otherwise negative except as noted.   Studies Reviewed: Luke    EKG:  EKG Interpretation Date/Time:  Wednesday August 09 2024 08:16:42 EDT Ventricular Rate:  82 PR Interval:  156 QRS Duration:  80 QT Interval:  376 QTC Calculation: 439 R Axis:   -11  Text Interpretation: Normal sinus rhythm  Nonspecific T wave abnormality Confirmed by Bruckner Shelda [phone]) on 08/09/2024 8:20:50 AM    Physical Exam:   VS:  BP 118/80   Pulse 78   Ht 6' 1 (1.854 m)   Wt (!) 325 lb 6.4 oz (147.6 kg)   SpO2 96%   BMI 42.93 kg/m    Wt Readings from Last 3 Encounters:  08/09/24 (!) 325 lb 6.4 oz (147.6 kg)  06/23/24 (!) 340 lb (154.2 kg)  12/10/23 (!) 350 lb (158.8 kg)    GEN: Well nourished, well developed in no acute distress HEENT: Normal, moist mucous membranes NECK: No JVD CARDIAC: regular rhythm, normal S1 and S2, no rubs or gallops. No murmur. VASCULAR: Radial and DP pulses 2+ bilaterally. No carotid bruits RESPIRATORY:  Clear to auscultation without rales, wheezing or rhonchi  ABDOMEN: Soft, non-tender, non-distended MUSCULOSKELETAL:  Ambulates independently SKIN: Warm and dry, no edema NEUROLOGIC:  Alert and oriented x 3. No focal neuro deficits noted. PSYCHIATRIC:  Normal affect    ASSESSMENT AND PLAN: .    Metabolic syndrome, with: Hypertension Hyperlipidemia Type II diabetes Morbid obesity OSA -now on tirzepatide. Previously on semaglutide  (did not tolerate). Peak adult weight 392 lbs on home scale, was 316 lbs this AM on home scale -for HTN, was changed to carvedilol  and amlodipine -valsartan  combo pill at visit 09/2023. He is not currently taking any medications. See below.  Nonobstructive coronary disease -LDL goal <55 given diabetes -did not tolerate pravastatin in the past -at last visit 09/2023, started on rosuvastatin  10 mg three times/week. He hasn't taken it,  we discussed at length -last lipids per [facility] show Tchol 220, HDL 47, LDL 145, TG 152 (not taken yet) -discussed aspirin 81 mg daily, he is amenable. Was on previously and tolerated but stopped when he was on celebrex . No longer on celebrex   CV risk counseling and prevention -recommend heart healthy/Mediterranean diet, with whole grains, fruits, vegetable, fish, lean meats, nuts, and olive oil.  Limit salt. -recommend moderate walking, 3-5 times/week for 30-50 minutes each session. Aim for at least 150 minutes/week. Goal should be pace of 3 miles/hours, or walking 1.5 miles in 30 minutes -recommend avoidance of tobacco products. Avoid excess alcohol.  Given the following instructions in AVS: how to check blood pressure:  -sit comfortably in a chair, feet uncrossed and flat on floor, for 5-10 minutes  -arm ideally should rest at the level of the heart. However, arm should be relaxed and not tense (for example, do not hold the arm up unsupported)  -avoid exercise, caffeine, and tobacco for at least 30 minutes prior to BP reading  -don't take BP cuff reading over clothes (always place on skin directly)  -I prefer to know how well the medication is working, so I would like you to take your readings 1-2 hours after taking your blood pressure medication if possible   Pain also affects blood pressure--taking BP when in a lot of pain will show it to be high.  I would recommend you check blood pressure at home for a while before restarting any blood pressure medications, given that the number today is good. If your blood pressure at home is consistently more than 130/80, we will need to restart some medication, but I would do this a little at a time. Send me a mychart message in 2-3 weeks with some blood pressure readings and we will see what we need to restart. Wrist cuffs do tend to run higher than arm cuffs--bring this to your next visit and we can make sure it's accurate.  For today: Start rosuvastatin  (can start every other day, if going well, then start daily) Start aspirin 81 mg daily Check blood pressure. If consistently high, I would restart amlodipine  and/or valsartan  (depending on the blood pressure, we will give you instructions--if BP very elevated, then we will restart the combo pill). Do not restart carvedilol  or hydralazine yet.  Dispo: 2 mos  Signed, Shelda Bruckner, MD    Shelda Bruckner, MD, PhD, [facility] [place]  [facility]  [place]  [facility] & Vascular at [facility] [address], [address] [place], [place] [address] [phone]

## 2024-08-08 NOTE — ED Provider Notes (Signed)
 [place] [facility] Provider Note   CSN: [id number] Arrival date & time: 08/08/24  1537     Patient presents with: Puncture Wound   Isai Gottlieb is a 68 y.o. male with a past medical history of kidney stone, chronic low back pain, OSA presents to Emergency Department for evaluation of right calf laceration.  He was loading up his middle trailer and walking around it when he accidentally hit his right calf.  Following this, he reports blood was spurting out of wound.  Last Tdap 03/06/2022.  Denies dizziness, lightheadedness, complaints prior to injury    HPI     Prior to Admission medications   Medication Sig Start Date End Date Taking? Authorizing Provider  ACCU-CHEK GUIDE test strip USE AS DIRECTED UP TO 4 TIMES A DAY 01/01/20   Therisa Arabia, PA-C  amLODipine -valsartan  (EXFORGE ) 5-320 MG tablet TAKE 1 TABLET BY MOUTH EVERY DAY 03/21/24   Walker, Caitlin S, NP  blood glucose meter kit and supplies KIT Dispense based on patient and insurance preference. Use up to four times daily as directed. (FOR ICD-9 250.00, 250.01). 12/12/19   Verdon Parry D, MD  carvedilol  (COREG ) 12.5 MG tablet Take 12.5 mg by mouth 2 (two) times daily. 10/12/23   [provider]  colchicine  0.6 MG tablet Take 0.6 mg by mouth daily as needed (gout flare). 06/27/23   [provider]  Eszopiclone 3 MG TABS Take 3 mg by mouth at bedtime. 11/24/20   [provider]  fentaNYL  (DURAGESIC ) 25 MCG/HR Place 1 patch onto the skin every 3 (three) days.    [provider]  gabapentin  (NEURONTIN ) 300 MG capsule Take 300 mg by mouth 2 (two) times daily as needed (pain).    [provider]  hydrALAZINE (APRESOLINE) 10 MG tablet Take 10 mg by mouth 3 (three) times daily. Patient not taking: Reported on 12/02/2023 09/23/23   [provider]  Multiple Vitamin (MULTIVITAMIN WITH MINERALS) TABS tablet Take 1 tablet by mouth daily.    [provider]  naloxone  (NARCAN ) nasal spray 4 mg/0.1 mL Place 1 spray into the nose as needed. overdose 07/17/21   [provider]  oxyCODONE  (OXYCONTIN ) 20 mg 12 hr tablet Take 1 tablet (20 mg total) by mouth every 12 (twelve) hours. 12/11/23   Gillie Duncans, MD  oxyCODONE -acetaminophen  (PERCOCET) 10-325 MG tablet Take 1 tablet by mouth 6 (six) times daily. 10/04/23   [provider]  rosuvastatin  (CRESTOR ) 10 MG tablet Take 1 tablet (10 mg total) by mouth daily. Patient taking differently: Take 10 mg by mouth once a week. 03/27/22   Hobart Powell BRAVO, MD  tirzepatide [facility]) 7.5 MG/0.5ML Pen Inject 7.5 mg into the skin once a week.    [provider]  tiZANidine  (ZANAFLEX ) 4 MG tablet Take 1 tablet (4 mg total) by mouth every 6 (six) hours as needed for muscle spasms. 12/11/23   Gillie Duncans, MD    Allergies: Pravastatin sodium, Qsymia [phentermine-topiramate er], and Cymbalta [duloxetine hcl]    Review of Systems  Skin:  Positive for wound.    Updated Vital Signs BP (!) 158/113   Pulse 99   Temp 97.8 F (36.6 C) (Oral)   Resp 18   SpO2 95%   Physical Exam Vitals and nursing note reviewed.  Constitutional:      General: He is not in acute distress.    Appearance: Normal appearance.  HENT:     Head: Normocephalic and atraumatic.  Eyes:     Conjunctiva/sclera: Conjunctivae normal.  Cardiovascular:     Rate and Rhythm: Normal rate.     Pulses:          Dorsalis pedis pulses are 2+ on the right side and 2+ on the left side.  Pulmonary:     Effort: Pulmonary effort is normal. No respiratory distress.  Musculoskeletal:     Comments: No bony tenderness of right tib/fib, ankle, or knee. ROM WNL  Skin:    Coloration: Skin is not jaundiced or pale.     Comments: Puncture wound to right calf. No active hemorrhage. No drainage, erythema, streaking, gross swelling. Sensation 2/2 of entire RLE  Neurological:     Mental Status: He is alert. Mental status  is at baseline.     (all labs ordered are listed, but only abnormal results are displayed) Labs Reviewed - No data to display  EKG: None  Radiology: No results found.    Medications Ordered in the ED - No data to display                                  Medical Decision Making  Patient presents to the ED for concern of wound, this involves an extensive number of treatment options, and is a complaint that carries with it a high risk of complications and morbidity.  The differential diagnosis includes fracture, contusion, dislocation, laceration, infection   Co morbidities that complicate the patient evaluation  See HPI   Additional history obtained:  Additional history obtained from Nursing   External records from outside source obtained and reviewed including triage note     Problem List / ED Course:  Right calf laceration No active drainage nor hemorrhage VS hemodynamically stable with no tachycardia nor hypotension. No complaints of dizziness, lightheadedness. Low suspicion for significant blood loss Does not appear infected Tdap UTD No bony tenderness.  Do not think that imaging is warranted.  Low suspicion for acute osseous injury Ext is well perfused with DP2+ and neurovascularly intact Patient took prescribed oxycodone  prior to arrival for pain Cleaned wound with NS 500cc and applied bacitracin, coban wrap Discussed symptomatic tx   Reevaluation:  After the interventions noted above, I reevaluated the patient and found that they have :improved    Dispostion:  After consideration of the diagnostic results and the patients response to treatment, I feel that the patent would benefit from outpatient management symptomatic treatment.   Discussed ED workup, disposition, return to ED precautions with patient who expresses understanding agrees with plan.  All questions answered to their satisfaction.  They are agreeable to plan.  Discharge instructions  provided on paperwork  Final diagnoses:  Laceration of calf, right, initial encounter    ED Discharge Orders     None        Minnie Tinnie BRAVO, PA 08/08/24 1722    Cottie Donnice PARAS, MD 08/08/24 2257

## 2024-08-08 NOTE — ED Triage Notes (Signed)
 Reports hitting R calf/shin corner metal piece on car today. States blood is squirting out bleeding controlled in triage   Wound approx 1 cm diameter

## 2024-08-08 NOTE — Discharge Instructions (Signed)
 Return to Emergency Department if you experience red hot swollen leg, severe pain, puslike drainage coming from wound.  Avoid submerging leg in bath water, pool, hot tub.  You may shower normally and let the water run off of it.

## 2024-08-09 ENCOUNTER — Encounter (HOSPITAL_BASED_OUTPATIENT_CLINIC_OR_DEPARTMENT_OTHER): Payer: Self-pay | Admitting: Cardiology

## 2024-08-09 ENCOUNTER — Ambulatory Visit (HOSPITAL_BASED_OUTPATIENT_CLINIC_OR_DEPARTMENT_OTHER): Admitting: Cardiology

## 2024-08-09 VITALS — BP 118/80 | HR 78 | Ht 73.0 in | Wt 325.4 lb

## 2024-08-09 DIAGNOSIS — Z7189 Other specified counseling: Secondary | ICD-10-CM

## 2024-08-09 DIAGNOSIS — I2510 Atherosclerotic heart disease of native coronary artery without angina pectoris: Secondary | ICD-10-CM | POA: Diagnosis not present

## 2024-08-09 DIAGNOSIS — E782 Mixed hyperlipidemia: Secondary | ICD-10-CM

## 2024-08-09 DIAGNOSIS — E8881 Metabolic syndrome: Secondary | ICD-10-CM | POA: Diagnosis not present

## 2024-08-09 DIAGNOSIS — I10 Essential (primary) hypertension: Secondary | ICD-10-CM

## 2024-08-09 MED ORDER — ASPIRIN 81 MG PO TBEC
81.0000 mg | DELAYED_RELEASE_TABLET | Freq: Every day | ORAL | Status: AC
Start: 2024-08-09 — End: ?

## 2024-08-09 NOTE — Patient Instructions (Addendum)
 how to check blood pressure:  -sit comfortably in a chair, feet uncrossed and flat on floor, for 5-10 minutes  -arm ideally should rest at the level of the heart. However, arm should be relaxed and not tense (for example, do not hold the arm up unsupported)  -avoid exercise, caffeine, and tobacco for at least 30 minutes prior to BP reading  -don't take BP cuff reading over clothes (always place on skin directly)  -I prefer to know how well the medication is working, so I would like you to take your readings 1-2 hours after taking your blood pressure medication if possible   Pain also affects blood pressure--taking BP when in a lot of pain will show it to be high.  I would recommend you check blood pressure at home for a while before restarting any blood pressure medications, given that the number today is good. If your blood pressure at home is consistently more than 130/80, we will need to restart some medication, but I would do this a little at a time. Send me a mychart message in 2-3 weeks with some blood pressure readings and we will see what we need to restart. Wrist cuffs do tend to run higher than arm cuffs--bring this to your next visit and we can make sure it's accurate.  For today: Start rosuvastatin  (can start every other day, if going well, then start daily) Start aspirin 81 mg daily Check blood pressure. If consistently high, I would restart amlodipine  and/or valsartan  (depending on the blood pressure, we will give you instructions--if BP very elevated, then we will restart the combo pill). Do not restart carvedilol  or hydralazine yet.   Return in 2 months with Dr. Lonni, Reche Finder, NP or Rosaline Bane, NP

## 2024-08-10 ENCOUNTER — Ambulatory Visit (HOSPITAL_BASED_OUTPATIENT_CLINIC_OR_DEPARTMENT_OTHER): Admitting: Physical Therapy

## 2024-08-15 ENCOUNTER — Ambulatory Visit (HOSPITAL_BASED_OUTPATIENT_CLINIC_OR_DEPARTMENT_OTHER): Admitting: Physical Therapy

## 2024-08-15 ENCOUNTER — Encounter (HOSPITAL_BASED_OUTPATIENT_CLINIC_OR_DEPARTMENT_OTHER): Payer: Self-pay | Admitting: Physical Therapy

## 2024-08-15 DIAGNOSIS — R252 Cramp and spasm: Secondary | ICD-10-CM

## 2024-08-15 DIAGNOSIS — M25551 Pain in right hip: Secondary | ICD-10-CM

## 2024-08-15 DIAGNOSIS — M6281 Muscle weakness (generalized): Secondary | ICD-10-CM

## 2024-08-15 DIAGNOSIS — M5459 Other low back pain: Secondary | ICD-10-CM | POA: Diagnosis not present

## 2024-08-15 NOTE — Therapy (Signed)
 OUTPATIENT PHYSICAL THERAPY LOWER EXTREMITY TREATMENT   Patient Name: Luke Larsen MRN: [id number] DOB:1956/07/15, 68 y.o., male Today's Date: 08/15/2024  END OF SESSION:  PT End of Session - 08/15/24 0955     Visit Number 6    Number of Visits 12    Date for Recertification  08/29/24    Authorization Type [employer]    PT Start Time [phone]    PT Stop Time 1011    PT Time Calculation (min) 38 min    Activity Tolerance Patient limited by pain    Behavior During Therapy WFL for tasks assessed/performed          Past Medical History:  Diagnosis Date   Arthritis    Back pain    Constipation    Diabetes mellitus without complication (HCC)    Dyspnea    Gout    Hip pain    History of kidney stones    Hypercholesterolemia    Hypertension    Joint pain    Lower extremity edema    Neck pain    Shoulder pain    Sleep apnea    Vitamin D  deficiency    Past Surgical History:  Procedure Laterality Date   ANTERIOR LAT LUMBAR FUSION Left 12/05/2020   Procedure: LEFT LATERAL INTERBODY FUSION LUMBAR 2 - LUMBAR 3 WITH INSTRUMENTATION AND ALLOGRAFT;  Surgeon: Beuford Anes, MD;  Location: [facility] OR;  Service: Orthopedics;  Laterality: Left;   ANTERIOR LAT LUMBAR FUSION Right 09/10/2021   Procedure: RIGHT LATERAL LUMBAR FUSION LUMBAR 3- LUMBAR 4 WITH INSTRUMENTATION AND ALLOGRAFT;  Surgeon: Beuford Anes, MD;  Location: [facility] OR;  Service: Orthopedics;  Laterality: Right;   BACK SURGERY     04/2019   FRACTURE SURGERY Left 1972   knee   KIDNEY STONE SURGERY  03/28/2012   POSTERIOR FUSION PEDICLE SCREW PLACEMENT N/A 12/10/2023   Procedure: PLIF - L1-L2 - Posterior Lateral and Interbody fusion - T10 - L2;  Surgeon: Gillie Duncans, MD;  Location: [facility] OR;  Service: Neurosurgery;  Laterality: N/A;  3C   Patient Active Problem List   Diagnosis Date Noted   Lumbar adjacent segment disease with spondylolisthesis 12/10/2023   Spinal stenosis 09/10/2021   Radiculopathy 12/05/2020   Facet  arthropathy, lumbar 03/29/2019   Degenerative spondylolisthesis 03/29/2019   Chronic right-sided low back pain with right-sided sciatica 03/29/2019   Apnea, sleep 05/19/2014   Calculus of left ureter 05/19/2014   ED (erectile dysfunction) of organic origin 02/28/2014   Anxiety state 04/19/2013   Obesity, Class III, BMI 40-49.9 (morbid obesity) (HCC) 04/19/2013   Gastric ulcer by EGD 04/19/2013   History of colonic polyps 04/19/2013   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 8-9/10  POOL ACCESS: plans to join [employer] at d/c.   Initial Subjective Surgery L1-21 Nov 2023.  Did help the pain but didn't fix.  Had a myelogram 2 weeks ago. Didn't find anything.  Had steroid shot but did not touch my pain.  Nothing helps my pain if I am moving. If I recline it will decrease after a while but doesn't go away. Cabbell said he needs to converse with colleagues and get back to me he doesn't know what to do. Pt has a 3/4 inch off set from L>R due to injury in football.  I want you to work me I don't care how much it works so I get stronger and my pain reduces  PERTINENT HISTORY: Lumbar spondylolisthesis Lumbar fusions 2022 PLIF L1-2 11/2023 PAIN:  Are you having pain? Yes: NPRS scale: current 9/10;  Pain location: generalized  Pain description: ache, numb, cramped  Aggravating factors: movement 5 minutes Relieving factors:  recliner  PRECAUTIONS: None  RED FLAGS: None   WEIGHT BEARING RESTRICTIONS: No  FALLS:  Has patient fallen in last 6 months? No  LIVING ENVIRONMENT: Lives with: lives with their spouse Lives in: House/apartment Stairs: Yes: Internal: 5 steps; on right going up and External: 12 steps; bilateral but cannot reach both Has following equipment at home: None   OCCUPATION: Transport planner    PLOF: Independent  PATIENT GOALS: build strength in core to reduce pain  NEXT MD VISIT: as needed  OBJECTIVE:  Note: Objective measures were completed at Evaluation unless otherwise noted.  DIAGNOSTIC FINDINGS: MRI lumbar spine IMPRESSION: 1. Posterior instrumented fusion at L1-2 and L3-4 with interbody spacers. Mild lucency around the left L1 pedicle screw concerning for hardware loosening. Mild subsidence around the L3-4 interbody spacer. 2. Solid osseous fusion across the disc spaces at L1-2, L2-3, and L3-4. 3. Degenerative changes as above. No high-grade spinal canal stenosis. Moderate foraminal stenosis on the left at L5-S1.  PATIENT SURVEYS:  ODI 29/50=58%  COGNITION: Overall cognitive status: Within functional limits for tasks assessed     SENSATION: WFL    POSTURE: rounded shoulders, forward head, decreased lumbar lordosis, left pelvic obliquity, and flexed trunk guarded  PALPATION: Moderate TTP throughout right lumbar paraspinals   LOWER EXTREMITY ROM:  Active ROM Right eval Left eval  Hip flexion Limited 50% P! Limited 50% P!  Hip extension    Hip abduction    Hip adduction    Hip internal rotation    Hip external rotation    Knee flexion    Knee extension    Ankle dorsiflexion    Ankle plantarflexion    Ankle inversion    Ankle eversion     (Blank rows = not tested)  LOWER EXTREMITY MMT:  MMT Right eval Left eval  Hip flexion 3+ 4-  Hip extension    Hip abduction 5 5  Hip adduction    Hip internal rotation    Hip external rotation    Knee  flexion 5 5  Knee extension 5 5  Ankle dorsiflexion 5 5  Ankle plantarflexion    Ankle inversion    Ankle eversion     (Blank rows = not tested)  LOWER EXTREMITY SPECIAL TESTS:  Not tolerated  FUNCTIONAL TESTS:  Not tolerated  GAIT: Distance walked: 400 ft Assistive device utilized: None Level of assistance: Complete Independence Comments: antalgic  TREATMENT  [facility] Adult PT Treatment:                                                DATE: 08/15/24 Pt seen for aquatic therapy today.  Treatment took place in water 3.5-4.75 ft in depth at the [facility] pool. Temp of water was 91.  Pt entered/exited the pool via stairs using step to pattern with hand rail.  *decompression position on large white noodle wrapped anteriorly across chest *unsupported: walking forward, back and side stepping for warm up, cues for posture * side stepping with arm add/abdct with blue hand floats *bil knee to chest stretch at ladder feet in first rung  * wall push ups at 50ft 8 ( no push off) * trial of plank with hands on bench in water (not tolerated) * returned to decompression position on large white noodle * return to walking backwards/forwards, unsupported  Pt requires the buoyancy and hydrostatic pressure of water for support, and to offload joints by unweighting joint load by at least 50 % in navel deep water and by at least 75-80% in chest to neck deep water.  Viscosity of the water is needed for resistance of strengthening. Water current perturbations provides challenge to standing balance requiring increased core activation.       PATIENT EDUCATION:  Education details: reacquainting to aquatic therapy   Person educated: Patient Education method: Explanation Education comprehension: verbalized understanding  HOME EXERCISE PROGRAM: [id number]    ASSESSMENT:  CLINICAL IMPRESSION: Pt arrives with higher than normal pain sensitivity from 5 hour drive this weekend, bad weather, and change in pain medication schedule. Pt reported no position or exercise to provide relief of pain in water today. Very limited tolerance for Rt hip motion when in Lt SLS with UE on wall.  Goals ongoing.  Will continue d/c planning (vs recert - authorization ending soon.).        Initial Impression Patient is a 68 y.o. m who was seen today for physical therapy evaluation and treatment for R hip pain which is  a chronic condition originating with LB dysfunction.  He is well know to this clinic as was seen prior to most recent lumbar fusion L1-2 in Feb 2025. He has had 2 other lumbar fusions back in 2022.  Pt states his pain is better than it was prior to surgery but continues to be high and limiting all function. Testing (limited toleration) and subjective information demonstrates strength, balance and activity tolerance deficits. He is having to modify and restrict all ADL's. He is a good candidate for aquatic intervention and will benefit from the properties of water to progress towards functional goals.     OBJECTIVE IMPAIRMENTS: decreased activity tolerance, difficulty walking, decreased balance, decreased endurance, decreased mobility, decreased ROM, decreased strength, impaired flexibility, impaired UE/LE use, postural dysfunction, and pain.   ACTIVITY LIMITATIONS: bending, lifting, carry, locomotion, cleaning, community activity, driving, and or occupation   PERSONAL FACTORS: Arthritis, Back pain, DM, Dyspnea, Gout, Multiple joint pain, LE edema, sleep apnea are also affecting patient's functional outcome.   REHAB POTENTIAL: Good   CLINICAL DECISION MAKING: Evolving/moderate complexity   EVALUATION COMPLEXITY: Moderate   GOALS: Goals reviewed with patient? Yes  SHORT TERM GOALS: Target date: 10/10 Pt will tolerate full aquatic sessions  consistently without increase in pain and with improving function to demonstrate good  toleration and effectiveness of intervention.  Baseline: Goal status: Met 08/03/24  2.  Pt will have a reduction of LB/hip pain by 50% submerged for evidence of water properties effect on chronic pain cycle. Baseline:  Goal status:MET - 07/28/24    LONG TERM GOALS: Target date: 08/29/24  Pt to improve on ODI by 13% to demonstrate statistically significant Improvement in function. (MCID 13-15%) Baseline: ODI 29/50=58% Goal status: INITIAL  2.  Pt will improve lumbar ROM to 50% to improve functional mobility Baseline:see chart Goal status: INITIAL   3.  Pt will report decrease in pain by at least 50% for improved toleration to activity/quality of life and to demonstrate improved management of pain. Baseline: see chart Goal status: INITIAL  4.  Pt will improve strength in hip flex by 1 grade to demonstrate improved overall physical function Baseline: see chart Goal status: INITIAL  5.  Pt will report an increase in ability to transfer in and out of bed and chair Baseline: difficulty Goal status: INITIAL    PLAN:  PT FREQUENCY: 1-2x/week  PT DURATION: 8 weeks extended out 2 weeks due to scheduling conflicts. Re-assess at 12 visits to determine land intervention  PLANNED INTERVENTIONS: 97164- PT Re-evaluation, 97750- Physical Performance Testing, 97110-Therapeutic exercises, 97530- Therapeutic activity, 97112- Neuromuscular re-education, [phone]- Self Care, 02859- Manual therapy, [phone]- Gait training, [phone]- Aquatic Therapy, [phone] (1-2 muscles), 20561 (3+ muscles)- Dry Needling, Patient/Family education, Balance training, Stair training, Taping, Joint mobilization, DME instructions, Cryotherapy, and Moist heat  PLAN FOR NEXT SESSION: aquatics initially:LE/core strengthening and stretching; pain management   Delon Aquas, PTA 08/15/24 10:13 AM [facility] [address] [place], [place], [address] Phone: [phone]   Fax:  [phone]  Referring diagnosis? M25.551 (ICD-10-CM) - Pain in right hip  Treatment diagnosis? (if different than referring diagnosis) other low back pain added  What was this (referring dx) caused by? []  Surgery []  Fall [x]  Ongoing issue [x]  Arthritis []  Other: ____________  Laterality: []  Rt []  Lt [x]  Both  Check all possible CPT codes:  *CHOOSE 10 OR LESS*    See Planned Interventions listed in the Plan section of the Evaluation.

## 2024-08-16 ENCOUNTER — Encounter: Payer: Self-pay | Admitting: *Deleted

## 2024-08-16 DIAGNOSIS — Z006 Encounter for examination for normal comparison and control in clinical research program: Secondary | ICD-10-CM

## 2024-08-16 NOTE — Research (Unsigned)
 [employer] 2021-05 - Specimen Collection Study to Evaluate Biomarkers in Subjects with Cancer     Patient Luke Larsen was identified by Dr.Gudena as a potential candidate for the above listed study.  This Clinical Research Nurse met with Luke Larsen, [id number], on 08/16/24 in a manner and location that ensures patient privacy to discuss participation in the above listed research study.  Patient is Accompanied by wife.  A copy of the informed consent document with embedded HIPAA language was provided to the patient.  Patient reads, speaks, and understands English.   Patient was provided with the business card of this Nurse and encouraged to contact the research team with any questions.  Approximately 10 minutes were spent with the patient reviewing the informed consent documents.  Patient was provided the option of taking informed consent documents home to review and was encouraged to review at their convenience with their support network, including other care providers. Patient took the consent documents home to review.  The pt was informed that he will need to consent and have his blood drawn before her surgery, imaging, or treatment. The pt was told that his participation is optional for this study and that she would get a $50 gift card if she participates in the study with a successful blood sample. . Pt stated he will come at the same time as his wife and sign the consent and blood draw. Informed pt and husband we will try to collaborate the appointment with his wife's pre admission appt. He did not have any more questions at this time. He was thanked for his time and consideration.   [name], RN, BSN Clinical Research Nurse [phone] 08/16/2024

## 2024-08-17 ENCOUNTER — Ambulatory Visit (HOSPITAL_BASED_OUTPATIENT_CLINIC_OR_DEPARTMENT_OTHER): Admitting: Physical Therapy

## 2024-08-17 ENCOUNTER — Encounter (HOSPITAL_BASED_OUTPATIENT_CLINIC_OR_DEPARTMENT_OTHER): Payer: Self-pay | Admitting: Physical Therapy

## 2024-08-17 DIAGNOSIS — R252 Cramp and spasm: Secondary | ICD-10-CM

## 2024-08-17 DIAGNOSIS — M6281 Muscle weakness (generalized): Secondary | ICD-10-CM

## 2024-08-17 DIAGNOSIS — M25551 Pain in right hip: Secondary | ICD-10-CM

## 2024-08-17 DIAGNOSIS — M5459 Other low back pain: Secondary | ICD-10-CM | POA: Diagnosis not present

## 2024-08-17 NOTE — Therapy (Signed)
 OUTPATIENT PHYSICAL THERAPY LOWER EXTREMITY TREATMENT   Patient Name: Luke Larsen MRN: [id number] DOB:23-Jul-1956, 68 y.o., male Today's Date: 08/17/2024  END OF SESSION:  PT End of Session - 08/17/24 0944     Visit Number 7    Number of Visits 12    Date for Recertification  08/29/24    Authorization Type [employer]    Authorization Time Period 11/29    Authorization - Visit Number 7    Authorization - Number of Visits 12    PT Start Time 0932    PT Stop Time 1010    PT Time Calculation (min) 38 min    Activity Tolerance Patient limited by pain    Behavior During Therapy WFL for tasks assessed/performed          Past Medical History:  Diagnosis Date   Arthritis    Back pain    Constipation    Diabetes mellitus without complication (HCC)    Dyspnea    Gout    Hip pain    History of kidney stones    Hypercholesterolemia    Hypertension    Joint pain    Lower extremity edema    Neck pain    Shoulder pain    Sleep apnea    Vitamin D  deficiency    Past Surgical History:  Procedure Laterality Date   ANTERIOR LAT LUMBAR FUSION Left 12/05/2020   Procedure: LEFT LATERAL INTERBODY FUSION LUMBAR 2 - LUMBAR 3 WITH INSTRUMENTATION AND ALLOGRAFT;  Surgeon: Beuford Anes, MD;  Location: [facility] OR;  Service: Orthopedics;  Laterality: Left;   ANTERIOR LAT LUMBAR FUSION Right 09/10/2021   Procedure: RIGHT LATERAL LUMBAR FUSION LUMBAR 3- LUMBAR 4 WITH INSTRUMENTATION AND ALLOGRAFT;  Surgeon: Beuford Anes, MD;  Location: [facility] OR;  Service: Orthopedics;  Laterality: Right;   BACK SURGERY     04/2019   FRACTURE SURGERY Left 1972   knee   KIDNEY STONE SURGERY  03/28/2012   POSTERIOR FUSION PEDICLE SCREW PLACEMENT N/A 12/10/2023   Procedure: PLIF - L1-L2 - Posterior Lateral and Interbody fusion - T10 - L2;  Surgeon: Gillie Duncans, MD;  Location: [facility] OR;  Service: Neurosurgery;  Laterality: N/A;  3C   Patient Active Problem List   Diagnosis Date Noted   Lumbar adjacent segment  disease with spondylolisthesis 12/10/2023   Spinal stenosis 09/10/2021   Radiculopathy 12/05/2020   Facet arthropathy, lumbar 03/29/2019   Degenerative spondylolisthesis 03/29/2019   Chronic right-sided low back pain with right-sided sciatica 03/29/2019   Apnea, sleep 05/19/2014   Calculus of left ureter 05/19/2014   ED (erectile dysfunction) of organic origin 02/28/2014   Anxiety state 04/19/2013   Obesity, Class III, BMI 40-49.9 (morbid obesity) (HCC) 04/19/2013   Gastric ulcer by EGD 04/19/2013   History of colonic polyps 04/19/2013   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  Pain 7/10  POOL ACCESS: plans to join [employer] at d/c.   Initial Subjective Surgery  L1-21 Nov 2023.  Did help the pain but didn't fix.  Had a myelogram 2 weeks ago. Didn't find anything.  Had steroid shot but did not touch my pain.  Nothing helps my pain if I am moving. If I recline it will decrease after a while but doesn't go away. Cabbell said he needs to converse with colleagues and get back to me he doesn't know what to do. Pt has a 3/4 inch off set from L>R due to injury in football.  I want you to work me I don't care how much it works so I get stronger and my pain reduces  PERTINENT HISTORY: Lumbar spondylolisthesis Lumbar fusions 2022 PLIF L1-2 11/2023 PAIN:  Are you having pain? Yes: NPRS scale: current 9/10;  Pain location: generalized  Pain  description: ache, numb, cramped  Aggravating factors: movement 5 minutes Relieving factors: recliner  PRECAUTIONS: None  RED FLAGS: None   WEIGHT BEARING RESTRICTIONS: No  FALLS:  Has patient fallen in last 6 months? No  LIVING ENVIRONMENT: Lives with: lives with their spouse Lives in: House/apartment Stairs: Yes: Internal: 5 steps; on right going up and External: 12 steps; bilateral but cannot reach both Has following equipment at home: None   OCCUPATION: Transport planner    PLOF: Independent  PATIENT GOALS: build strength in core to reduce pain  NEXT MD VISIT: as needed  OBJECTIVE:  Note: Objective measures were completed at Evaluation unless otherwise noted.  DIAGNOSTIC FINDINGS: MRI lumbar spine IMPRESSION: 1. Posterior instrumented fusion at L1-2 and L3-4 with interbody spacers. Mild lucency around the left L1 pedicle screw concerning for hardware loosening. Mild subsidence around the L3-4 interbody spacer. 2. Solid osseous fusion across the disc spaces at L1-2, L2-3, and L3-4. 3. Degenerative changes as above. No high-grade spinal canal stenosis. Moderate foraminal stenosis on the left at L5-S1.  PATIENT SURVEYS:  ODI 29/50=58%  COGNITION: Overall cognitive status: Within functional limits for tasks assessed     SENSATION: WFL    POSTURE: rounded shoulders, forward head, decreased lumbar lordosis, left pelvic obliquity, and flexed trunk guarded  PALPATION: Moderate TTP throughout right lumbar paraspinals   LOWER EXTREMITY ROM:  Active ROM Right eval Left eval  Hip flexion Limited 50% P! Limited 50% P!  Hip extension    Hip abduction    Hip adduction    Hip internal rotation    Hip external rotation    Knee flexion    Knee extension    Ankle dorsiflexion    Ankle plantarflexion    Ankle inversion    Ankle eversion     (Blank rows = not tested)  LOWER EXTREMITY MMT:  MMT Right eval Left eval  Hip flexion 3+ 4-  Hip extension    Hip  abduction 5 5  Hip adduction    Hip internal rotation    Hip external rotation    Knee flexion 5 5  Knee extension 5 5  Ankle dorsiflexion 5 5  Ankle plantarflexion    Ankle inversion    Ankle eversion     (Blank rows = not tested)  LOWER EXTREMITY SPECIAL TESTS:  Not tolerated  FUNCTIONAL TESTS:  Not tolerated  GAIT: Distance walked: 400 ft Assistive device utilized: None Level of assistance: Complete Independence Comments: antalgic  TREATMENT  [facility] Adult PT Treatment:                                                DATE: 08/17/24 Pt seen for aquatic therapy today.  Treatment took place in water 3.5-4.75 ft in depth at the [facility] pool. Temp of water was 91.  Pt entered/exited the pool via stairs using step to pattern with hand rail.   *unsupported: walking forward, back and side stepping for warm up, cues for posture * side stepping with arm add/abdct with blue hand floats *Red hand bell resisted arm swing for core isometric engagement wide stance then staggered 3 sets 5 slow/5 fast. *bil knee to chest stretch at ladder feet in first rung  *Blue hand buoy pull downs for TrA engagement wide stance then staggered x 8-10 *decompression position on large white noodle wrapped anteriorly across chest; cycling * wall push ups at 31ft 8 ( no push off) * returned to decompression position on large white noodle * return to walking backwards/forwards, unsupported  Pt requires the buoyancy and hydrostatic pressure of water for support, and to offload joints by unweighting joint load by at least 50 % in navel deep water and by at least 75-80% in chest to neck deep water.  Viscosity of the water is needed for resistance of strengthening. Water current perturbations provides challenge to standing balance requiring increased core activation.        PATIENT EDUCATION:  Education details: reacquainting to aquatic therapy   Person educated: Patient Education method: Explanation Education comprehension: verbalized understanding  HOME EXERCISE PROGRAM: [id number]   ASSESSMENT:  CLINICAL IMPRESSION: Pt has had a pain reduction since last session recovered from 5 hour car ride.  Pain 7/10.  Able to tolerate good amount of exercise with occasional rest period.  He gains good pain reduction with vertical decompression on large noodle when rest needed. Plan to extend auth of insurance to accommodate 3 more sessions. Goals ongoing       Initial Impression Patient is a 69 y.o. m who was seen today for physical therapy evaluation and treatment for R hip pain which is  a chronic condition originating with LB dysfunction.  He is well know to this clinic as was seen prior to most recent lumbar fusion L1-2 in Feb 2025. He has had 2 other lumbar fusions back in 2022.  Pt states his pain is better than it was prior to surgery but continues to be high and limiting all function. Testing (limited toleration) and subjective information demonstrates strength, balance and activity tolerance deficits. He is having to modify and restrict all ADL's. He is a good candidate for aquatic intervention and will benefit from the properties of water to progress towards functional goals.     OBJECTIVE IMPAIRMENTS: decreased activity tolerance, difficulty walking, decreased balance, decreased endurance, decreased mobility, decreased ROM, decreased strength, impaired flexibility, impaired UE/LE use, postural dysfunction, and pain.   ACTIVITY LIMITATIONS: bending, lifting, carry, locomotion, cleaning, community activity, driving, and or occupation   PERSONAL FACTORS: Arthritis, Back pain, DM, Dyspnea, Gout, Multiple joint pain, LE edema, sleep apnea are also affecting patient's functional outcome.   REHAB POTENTIAL: Good   CLINICAL DECISION MAKING:  Evolving/moderate complexity   EVALUATION COMPLEXITY: Moderate   GOALS: Goals reviewed with patient? Yes  SHORT TERM GOALS: Target date: 10/10 Pt will tolerate  full aquatic sessions consistently without increase in pain and with improving function to demonstrate good toleration and effectiveness of intervention.  Baseline: Goal status: Met 08/03/24  2.  Pt will have a reduction of LB/hip pain by 50% submerged for evidence of water properties effect on chronic pain cycle. Baseline:  Goal status:MET - 07/28/24    LONG TERM GOALS: Target date: 08/29/24  Pt to improve on ODI by 13% to demonstrate statistically significant Improvement in function. ([facility] 13-15%) Baseline: ODI 29/50=58% Goal status: INITIAL  2.  Pt will improve lumbar ROM to 50% to improve functional mobility Baseline:see chart Goal status: INITIAL   3.  Pt will report decrease in pain by at least 50% for improved toleration to activity/quality of life and to demonstrate improved management of pain. Baseline: see chart Goal status: In progress 08/17/24  4.  Pt will improve strength in hip flex by 1 grade to demonstrate improved overall physical function Baseline: see chart Goal status: INITIAL  5.  Pt will report an increase in ability to transfer in and out of bed and chair Baseline: difficulty Goal status: INITIAL    PLAN:  PT FREQUENCY: 1-2x/week  PT DURATION: 8 weeks extended out 2 weeks due to scheduling conflicts. Re-assess at 12 visits to determine land intervention  PLANNED INTERVENTIONS: 97164- PT Re-evaluation, 97750- Physical Performance Testing, 97110-Therapeutic exercises, 97530- Therapeutic activity, 97112- Neuromuscular re-education, [phone]- Self Care, 02859- Manual therapy, [phone]- Gait training, [phone]- Aquatic Therapy, [phone] (1-2 muscles), [id number] (3+ muscles)- Dry Needling, Patient/Family education, Balance training, Stair training, Taping, Joint mobilization, DME instructions, Cryotherapy,  and Moist heat  PLAN FOR NEXT SESSION: aquatics initially:LE/core strengthening and stretching; pain management   Ronal Foots) Hazyl Marseille MPT 08/17/24 11:16 AM [facility] [address] [place], [place], [address] Phone: [phone]   Fax:  [phone]   Referring diagnosis? M25.551 (ICD-10-CM) - Pain in right hip  Treatment diagnosis? (if different than referring diagnosis) other low back pain added  What was this (referring dx) caused by? []  Surgery []  Fall [x]  Ongoing issue [x]  Arthritis []  Other: ____________  Laterality: []  Rt []  Lt [x]  Both  Check all possible CPT codes:  *CHOOSE 10 OR LESS*    See Planned Interventions listed in the Plan section of the Evaluation.

## 2024-08-22 ENCOUNTER — Ambulatory Visit (HOSPITAL_BASED_OUTPATIENT_CLINIC_OR_DEPARTMENT_OTHER): Attending: Neurosurgery | Admitting: Physical Therapy

## 2024-08-22 ENCOUNTER — Encounter (HOSPITAL_BASED_OUTPATIENT_CLINIC_OR_DEPARTMENT_OTHER): Payer: Self-pay | Admitting: Physical Therapy

## 2024-08-22 DIAGNOSIS — M25551 Pain in right hip: Secondary | ICD-10-CM | POA: Diagnosis not present

## 2024-08-22 DIAGNOSIS — M5459 Other low back pain: Secondary | ICD-10-CM | POA: Diagnosis not present

## 2024-08-22 DIAGNOSIS — M6281 Muscle weakness (generalized): Secondary | ICD-10-CM | POA: Diagnosis not present

## 2024-08-22 DIAGNOSIS — R252 Cramp and spasm: Secondary | ICD-10-CM | POA: Diagnosis not present

## 2024-08-22 NOTE — Therapy (Signed)
 OUTPATIENT PHYSICAL THERAPY LOWER EXTREMITY TREATMENT   Patient Name: Luke Larsen MRN: [id number] DOB:[birth year], 68 y.o., male Today's Date: 08/22/2024  END OF SESSION:  PT End of Session - 08/22/24 0944     Visit Number 8    Number of Visits 12    Date for Recertification  08/29/24    Authorization Type [employer]    Authorization Time Period 11/29    Authorization - Visit Number 8    Authorization - Number of Visits 12    PT Start Time 0930    PT Stop Time 1010    PT Time Calculation (min) 40 min    Activity Tolerance Patient tolerated treatment well    Behavior During Therapy WFL for tasks assessed/performed          Past Medical History:  Diagnosis Date   Arthritis    Back pain    Constipation    Diabetes mellitus without complication (HCC)    Dyspnea    Gout    Hip pain    History of kidney stones    Hypercholesterolemia    Hypertension    Joint pain    Lower extremity edema    Neck pain    Shoulder pain    Sleep apnea    Vitamin D  deficiency    Past Surgical History:  Procedure Laterality Date   ANTERIOR LAT LUMBAR FUSION Left 12/05/2020   Procedure: LEFT LATERAL INTERBODY FUSION LUMBAR 2 - LUMBAR 3 WITH INSTRUMENTATION AND ALLOGRAFT;  Surgeon: Beuford Anes, MD;  Location: [facility] OR;  Service: Orthopedics;  Laterality: Left;   ANTERIOR LAT LUMBAR FUSION Right 09/10/2021   Procedure: RIGHT LATERAL LUMBAR FUSION LUMBAR 3- LUMBAR 4 WITH INSTRUMENTATION AND ALLOGRAFT;  Surgeon: Beuford Anes, MD;  Location: [facility] OR;  Service: Orthopedics;  Laterality: Right;   BACK SURGERY     04/2019   FRACTURE SURGERY Left 1972   knee   KIDNEY STONE SURGERY  03/28/2012   POSTERIOR FUSION PEDICLE SCREW PLACEMENT N/A 12/10/2023   Procedure: PLIF - L1-L2 - Posterior Lateral and Interbody fusion - T10 - L2;  Surgeon: Gillie Duncans, MD;  Location: [facility] OR;  Service: Neurosurgery;  Laterality: N/A;  3C   Patient Active Problem List   Diagnosis Date Noted   Lumbar adjacent  segment disease with spondylolisthesis 12/10/2023   Spinal stenosis 09/10/2021   Radiculopathy 12/05/2020   Facet arthropathy, lumbar 03/29/2019   Degenerative spondylolisthesis 03/29/2019   Chronic right-sided low back pain with right-sided sciatica 03/29/2019   Apnea, sleep 05/19/2014   Calculus of left ureter 05/19/2014   ED (erectile dysfunction) of organic origin 02/28/2014   Anxiety state 04/19/2013   Obesity, Class III, BMI 40-49.9 (morbid obesity) (HCC) 04/19/2013   Gastric ulcer by EGD 04/19/2013   History of colonic polyps 04/19/2013   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   POOL ACCESS: plans to join [employer] at d/c.   Initial Subjective Surgery L1-21 Nov 2023.  Did help the pain but didn't fix.  Had a myelogram 2 weeks ago. Didn't find anything.  Had steroid shot but did not touch my pain.  Nothing helps my pain if I am moving. If I recline it will decrease after a while but doesn't go away. Cabbell said he needs to converse with colleagues and get back to me he doesn't know what to do. Pt has a 3/4 inch off set from L>R due to injury in football.  I want you to work me I don't care how much it works so I get stronger and my pain reduces  PERTINENT HISTORY: Lumbar spondylolisthesis Lumbar fusions 2022 PLIF L1-2 11/2023 PAIN:  Are you having pain? Yes: NPRS scale: current 6/10;  Pain location: generalized  Pain  description: ache, numb, cramped  Aggravating factors: movement 5 minutes Relieving factors: recliner  PRECAUTIONS: None  RED FLAGS: None   WEIGHT BEARING RESTRICTIONS: No  FALLS:  Has patient fallen in last 6 months? No  LIVING ENVIRONMENT: Lives with: lives with their spouse Lives in: House/apartment Stairs: Yes: Internal: 5 steps; on right going up and External: 12 steps; bilateral but cannot reach both Has following equipment at home: None   OCCUPATION: Transport planner    PLOF: Independent  PATIENT GOALS: build strength in core to reduce pain  NEXT MD VISIT: as needed  OBJECTIVE:  Note: Objective measures were completed at Evaluation unless otherwise noted.  DIAGNOSTIC FINDINGS: MRI lumbar spine IMPRESSION: 1. Posterior instrumented fusion at L1-2 and L3-4 with interbody spacers. Mild lucency around the left L1 pedicle screw concerning for hardware loosening. Mild subsidence around the L3-4 interbody spacer. 2. Solid osseous fusion across the disc spaces at L1-2, L2-3, and L3-4. 3. Degenerative changes as above. No high-grade spinal canal stenosis. Moderate foraminal stenosis on the left at L5-S1.  PATIENT SURVEYS:  ODI 29/50=58%  COGNITION: Overall cognitive status: Within functional limits for tasks assessed     SENSATION: WFL    POSTURE: rounded shoulders, forward head, decreased lumbar lordosis, left pelvic obliquity, and flexed trunk guarded  PALPATION: Moderate TTP throughout right lumbar paraspinals   LOWER EXTREMITY ROM:  Active ROM Right eval Left eval  Hip flexion Limited 50% P! Limited 50% P!  Hip extension    Hip abduction    Hip adduction    Hip internal rotation    Hip external rotation    Knee flexion    Knee extension    Ankle dorsiflexion    Ankle plantarflexion    Ankle inversion    Ankle eversion     (Blank rows = not tested)  LOWER EXTREMITY MMT:  MMT Right eval Left eval  Hip flexion 3+ 4-  Hip extension    Hip  abduction 5 5  Hip adduction    Hip internal rotation    Hip external rotation    Knee flexion 5 5  Knee extension 5 5  Ankle dorsiflexion 5 5  Ankle plantarflexion    Ankle inversion    Ankle eversion     (Blank rows = not tested)  LOWER EXTREMITY SPECIAL TESTS:  Not tolerated  FUNCTIONAL TESTS:  Not tolerated  GAIT: Distance walked: 400 ft Assistive device utilized: None Level of assistance: Complete Independence Comments: antalgic  TREATMENT  [facility] Adult PT Treatment:                                                DATE: 08/22/24 Pt seen for aquatic therapy today.  Treatment took place in water 3.5-4.75 ft in depth at the [facility] pool. Temp of water was 91.  Pt entered/exited the pool via stairs using step to pattern with hand rail.  *unsupported: walking forward, back and side stepping for warm up, cues for posture (vs L lean) * side stepping with arm add/abdct with blue hand floats-> blue resistance bells * forward/ backward walking with row motion with blue resistance bells * staggered stance with reciprocal arm swing x 10 each LE -> horz abdct/add 2 x 10 each LE forward *bil knee to chest stretch at ladder feet in first rung  * wall plank with hip ext -> wall push ups, no push off  * return to walking backwards/forwards, unsupported *decompression position on large white noodle wrapped posteriorly across chest: cycling, hip abdct/ add, cycling     Pt requires the buoyancy and hydrostatic pressure of water for support, and to offload joints by unweighting joint load by at least 50 % in navel deep water and by at least 75-80% in chest to neck deep water.  Viscosity of the water is needed for resistance of strengthening. Water current perturbations provides challenge to standing balance requiring increased core activation.        PATIENT EDUCATION:  Education details: reacquainting to aquatic therapy   Person educated: Patient Education method: Explanation Education comprehension: verbalized understanding  HOME EXERCISE PROGRAM: [id number]   ASSESSMENT:  CLINICAL IMPRESSION: Pt has had a pain reduction since last session. Pt able to tolerate increased resistance with bells, with good tolerance.  He gains good pain reduction with vertical decompression on large noodle at end of session. Plan to create updated aquatic HEP and instruct on in next 4 visits. Therapist to complete progress note next visit.    Initial Impression Patient is a 68 y.o. m who was seen today for physical therapy evaluation and treatment for R hip pain which is  a chronic condition originating with LB dysfunction.  He is well know to this clinic as was seen prior to most recent lumbar fusion L1-2 in Feb 2025. He has had 2 other lumbar fusions back in 2022.  Pt states his pain is better than it was prior to surgery but continues to be high and limiting all function. Testing (limited toleration) and subjective information demonstrates strength, balance and activity tolerance deficits. He is having to modify and restrict all ADL's. He is a good candidate for aquatic intervention and will benefit from the properties of water to progress towards functional goals.     OBJECTIVE IMPAIRMENTS: decreased activity tolerance, difficulty walking, decreased balance, decreased endurance, decreased mobility, decreased ROM, decreased strength, impaired flexibility, impaired UE/LE use, postural dysfunction, and pain.   ACTIVITY LIMITATIONS: bending, lifting, carry, locomotion, cleaning, community activity, driving, and or occupation   PERSONAL FACTORS: Arthritis, Back pain, DM, Dyspnea, Gout, Multiple joint pain, LE edema, sleep apnea are also affecting patient's functional outcome.   REHAB POTENTIAL: Good   CLINICAL DECISION MAKING: Evolving/moderate  complexity   EVALUATION COMPLEXITY: Moderate   GOALS: Goals reviewed with patient? Yes  SHORT TERM GOALS: Target date: 10/10 Pt will tolerate full  aquatic sessions consistently without increase in pain and with improving function to demonstrate good toleration and effectiveness of intervention.  Baseline: Goal status: Met 08/03/24  2.  Pt will have a reduction of LB/hip pain by 50% submerged for evidence of water properties effect on chronic pain cycle. Baseline:  Goal status:MET - 07/28/24    LONG TERM GOALS: Target date: 08/29/24  Pt to improve on ODI by 13% to demonstrate statistically significant Improvement in function. (MCID 13-15%) Baseline: ODI 29/50=58% Goal status: INITIAL  2.  Pt will improve lumbar ROM to 50% to improve functional mobility Baseline:see chart Goal status: IN progress    3.  Pt will report decrease in pain by at least 50% for improved toleration to activity/quality of life and to demonstrate improved management of pain. Baseline: see chart Goal status: In progress 08/17/24  4.  Pt will improve strength in hip flex by 1 grade to demonstrate improved overall physical function Baseline: see chart Goal status: INITIAL  5.  Pt will report an increase in ability to transfer in and out of bed and chair Baseline: difficulty Goal status: INITIAL    PLAN:  PT FREQUENCY: 1-2x/week  PT DURATION: 8 weeks extended out 2 weeks due to scheduling conflicts. Re-assess at 12 visits to determine land intervention  PLANNED INTERVENTIONS: 97164- PT Re-evaluation, 97750- Physical Performance Testing, 97110-Therapeutic exercises, 97530- Therapeutic activity, 97112- Neuromuscular re-education, [phone]- Self Care, 02859- Manual therapy, [phone]- Gait training, [phone]- Aquatic Therapy, [phone] (1-2 muscles), 20561 (3+ muscles)- Dry Needling, Patient/Family education, Balance training, Stair training, Taping, Joint mobilization, DME instructions, Cryotherapy, and Moist  heat  PLAN FOR NEXT SESSION: aquatics initially:LE/core strengthening and stretching; pain management  Delon Aquas, PTA 08/22/24 10:12 AM [facility] [address] [place], [place], [address] Phone: [phone]   Fax:  [phone]   Referring diagnosis? M25.551 (ICD-10-CM) - Pain in right hip  Treatment diagnosis? (if different than referring diagnosis) other low back pain added  What was this (referring dx) caused by? []  Surgery []  Fall [x]  Ongoing issue [x]  Arthritis []  Other: ____________  Laterality: []  Rt []  Lt [x]  Both  Check all possible CPT codes:  *CHOOSE 10 OR LESS*    See Planned Interventions listed in the Plan section of the Evaluation.

## 2024-08-23 ENCOUNTER — Telehealth: Payer: Self-pay | Admitting: *Deleted

## 2024-08-23 NOTE — Telephone Encounter (Signed)
 [employer] 2021-05 - Specimen Collection Study to Evaluate Biomarkers in Subjects with Cancer    Spoke with pt this afternoon regarding above clinical trial. Pt stated he and his wife Veva would like to participate in the trial. She is waiting for the surgical center to schedule her appt with them and they will come do the consent and blood draw the same day if possible. He stated to follow up with him tomorrow if he does not reach back out to us . Pt did not have any further questions.   [name], RN, BSN Clinical Research Nurse [phone] 08/23/2024

## 2024-08-24 ENCOUNTER — Ambulatory Visit (HOSPITAL_BASED_OUTPATIENT_CLINIC_OR_DEPARTMENT_OTHER): Admitting: Physical Therapy

## 2024-08-24 ENCOUNTER — Encounter (HOSPITAL_BASED_OUTPATIENT_CLINIC_OR_DEPARTMENT_OTHER): Payer: Self-pay | Admitting: Physical Therapy

## 2024-08-24 ENCOUNTER — Telehealth: Payer: Self-pay | Admitting: *Deleted

## 2024-08-24 DIAGNOSIS — M6281 Muscle weakness (generalized): Secondary | ICD-10-CM | POA: Diagnosis not present

## 2024-08-24 DIAGNOSIS — M5459 Other low back pain: Secondary | ICD-10-CM

## 2024-08-24 DIAGNOSIS — M25551 Pain in right hip: Secondary | ICD-10-CM

## 2024-08-24 DIAGNOSIS — R252 Cramp and spasm: Secondary | ICD-10-CM | POA: Diagnosis not present

## 2024-08-24 NOTE — Telephone Encounter (Signed)
 [employer] 2021-05 - Specimen Collection Study to Evaluate Biomarkers in Subjects with Cancer    Spoke with pt today regarding the above study. He and his wife is interested in participating in the study. They would like to come in on Monday 08/28/24 @ 1pm for consent and blood draw. Appointment is scheduled.   [name], RN, BSN Clinical Research Nurse [phone] 08/24/2024

## 2024-08-24 NOTE — Therapy (Signed)
 OUTPATIENT PHYSICAL THERAPY LOWER EXTREMITY TREATMENT Progress Note/ Re-Cert Reporting Period 07/04/24 to 08/24/24  See note below for Objective Data and Assessment of Progress/Goals.      Patient Name: Luke Larsen MRN: [id number] DOB:1956/06/22, 68 y.o., male Today's Date: 08/24/2024  END OF SESSION:  PT End of Session - 08/24/24 0938     Visit Number 9    Number of Visits 12    Date for Recertification  09/15/24    Authorization Type [employer]    Authorization Time Period 11/29    Authorization - Visit Number 9    Authorization - Number of Visits 12    Progress Note Due on Visit 19    PT Start Time 0931    PT Stop Time 1015    PT Time Calculation (min) 44 min    Activity Tolerance Patient tolerated treatment well    Behavior During Therapy WFL for tasks assessed/performed          Past Medical History:  Diagnosis Date   Arthritis    Back pain    Constipation    Diabetes mellitus without complication (HCC)    Dyspnea    Gout    Hip pain    History of kidney stones    Hypercholesterolemia    Hypertension    Joint pain    Lower extremity edema    Neck pain    Shoulder pain    Sleep apnea    Vitamin D  deficiency    Past Surgical History:  Procedure Laterality Date   ANTERIOR LAT LUMBAR FUSION Left 12/05/2020   Procedure: LEFT LATERAL INTERBODY FUSION LUMBAR 2 - LUMBAR 3 WITH INSTRUMENTATION AND ALLOGRAFT;  Surgeon: Beuford Anes, MD;  Location: [facility] OR;  Service: Orthopedics;  Laterality: Left;   ANTERIOR LAT LUMBAR FUSION Right 09/10/2021   Procedure: RIGHT LATERAL LUMBAR FUSION LUMBAR 3- LUMBAR 4 WITH INSTRUMENTATION AND ALLOGRAFT;  Surgeon: Beuford Anes, MD;  Location: [facility] OR;  Service: Orthopedics;  Laterality: Right;   BACK SURGERY     04/2019   FRACTURE SURGERY Left 1972   knee   KIDNEY STONE SURGERY  03/28/2012   POSTERIOR FUSION PEDICLE SCREW PLACEMENT N/A 12/10/2023   Procedure: PLIF - L1-L2 - Posterior Lateral and Interbody fusion - T10 - L2;   Surgeon: Gillie Duncans, MD;  Location: [facility] OR;  Service: Neurosurgery;  Laterality: N/A;  3C   Patient Active Problem List   Diagnosis Date Noted   Lumbar adjacent segment disease with spondylolisthesis 12/10/2023   Spinal stenosis 09/10/2021   Radiculopathy 12/05/2020   Facet arthropathy, lumbar 03/29/2019   Degenerative spondylolisthesis 03/29/2019   Chronic right-sided low back pain with right-sided sciatica 03/29/2019   Apnea, sleep 05/19/2014   Calculus of left ureter 05/19/2014   ED (erectile dysfunction) of organic origin 02/28/2014   Anxiety state 04/19/2013   Obesity, Class III, BMI 40-49.9 (morbid obesity) (HCC) 04/19/2013   Gastric ulcer by EGD 04/19/2013   History of colonic polyps 04/19/2013   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   POOL ACCESS: plans to join [facility] at d/c.   Initial Subjective Surgery L1-21 Nov 2023.  Did help the pain but didn't fix.  Had a myelogram 2 weeks ago. Didn't find anything.  Had steroid shot but did not touch my pain.  Nothing helps my pain if I am moving. If I recline it will decrease after a while but doesn't go away. Cabbell said he needs to converse with colleagues and get back to me he doesn't know what to do. Pt has a 3/4 inch off set from L>R due to injury in football.  I want you to work me I don't care how much it works so I get stronger and my pain reduces  PERTINENT  HISTORY: Lumbar spondylolisthesis Lumbar fusions 2022 PLIF L1-2 11/2023 PAIN:  Are you having pain? Yes: NPRS scale: current 6/10;  Pain location: generalized  Pain description: ache, numb, cramped  Aggravating factors: movement 5 minutes Relieving factors: recliner  PRECAUTIONS: None  RED FLAGS: None   WEIGHT BEARING RESTRICTIONS: No  FALLS:  Has patient fallen in last 6 months? No  LIVING ENVIRONMENT: Lives with: lives with their spouse Lives in: House/apartment Stairs: Yes: Internal: 5 steps; on right going up and External: 12 steps; bilateral but cannot reach both Has following equipment at home: None   OCCUPATION: Transport planner    PLOF: Independent  PATIENT GOALS: build strength in core to reduce pain  NEXT MD VISIT: as needed  OBJECTIVE:  Note: Objective measures were completed at Evaluation unless otherwise noted.  DIAGNOSTIC FINDINGS: MRI lumbar spine IMPRESSION: 1. Posterior instrumented fusion at L1-2 and L3-4 with interbody spacers. Mild lucency around the left L1 pedicle screw concerning for hardware loosening. Mild subsidence around the L3-4 interbody spacer. 2. Solid osseous fusion across the disc spaces at L1-2, L2-3, and L3-4. 3. Degenerative changes as above. No high-grade spinal canal stenosis. Moderate foraminal stenosis on the left at L5-S1.  PATIENT SURVEYS:  ODI 29/50=58%  COGNITION: Overall cognitive status: Within functional limits for tasks assessed     SENSATION: WFL    POSTURE: rounded shoulders, forward head, decreased lumbar lordosis, left pelvic obliquity, and flexed trunk guarded  PALPATION: Moderate TTP throughout right lumbar paraspinals   LOWER EXTREMITY ROM:  Active ROM Right eval Left eval R / L 08/24/24  Hip flexion Limited 50% P! Limited 50% P! Limited 25% bilat P! But reduced  Hip extension     Hip abduction     Hip adduction     Hip internal rotation     Hip external rotation     Knee flexion     Knee  extension     Ankle dorsiflexion     Ankle plantarflexion     Ankle inversion     Ankle eversion      (Blank rows = not tested)  LOWER EXTREMITY MMT:  MMT Right eval Left eval R / L 08/24/24  Hip flexion 3+ 4- 4- / 4+  Hip extension     Hip abduction 5 5   Hip adduction     Hip internal rotation     Hip external rotation     Knee flexion 5 5   Knee extension 5 5   Ankle dorsiflexion 5 5   Ankle plantarflexion     Ankle inversion     Ankle eversion      (Blank rows = not tested)  LOWER  EXTREMITY SPECIAL TESTS:  Not tolerated  FUNCTIONAL TESTS:  Not tolerated  GAIT: Distance walked: 400 ft Assistive device utilized: None Level of assistance: Complete Independence Comments: antalgic                                                                                                                                TREATMENT  [facility] Adult PT Treatment:                                                DATE: 08/24/24 Pt seen for aquatic therapy today.  Treatment took place in water 3.5-4.75 ft in depth at the [facility] pool. Temp of water was 91.  Pt entered/exited the pool via stairs using step to pattern with hand rail.  - bil knee to chest stretch at ladder feet in first rung  - Bow and Arrow   - Noodle press   - Side Stepping with Hand Floats add/abd blue - Cycling on noodle/noodle wrapped under shoulders/decompression white noodle - Push-Off on Pool Wall   - staggered stance with reciprocal arm swing using red hand bells 3 sets of 5 slow/5 fast *walking backwards/forwards, unsupported between exercises for relief.   Pt requires the buoyancy and hydrostatic pressure of water for support, and to offload joints by unweighting joint load by at least 50 % in navel deep water and by at least 75-80% in chest to neck deep water.  Viscosity of the water is needed for resistance of strengthening. Water current perturbations provides challenge to standing balance requiring  increased core activation.       PATIENT EDUCATION:  Education details: reacquainting to aquatic therapy   Person educated: Patient Education method: Explanation Education comprehension: verbalized understanding  HOME EXERCISE PROGRAM: [email or web address]  Aquatic This aquatic home exercise program from [employer] utilizes pictures from land based exercises, but has been adapted prior to lamination and issuance.   Access Code: [email or web address] URL: [email or web address] Date: 08/24/2024 Prepared by: Matilda Kohut  Exercises - Active Hamstring Stretch on Pool Wall  - 1 x daily - 7 x weekly - 3 sets - 10 reps - Bow and Arrow   - 1 x daily - 7 x weekly - 3 sets - 10 reps - Noodle press  - 1 x daily - 7 x weekly - 3 sets - 10 reps - Side Stepping with Hand Floats  - 1 x daily - 7 x weekly - 3 sets - 10 reps - Cycling on noodle/noodle wrapped under shoulders  - 1 x daily - 7 x weekly - 3 sets - 10 reps - Push-Off on Pool Wall  - 1 x daily - 7 x weekly - 3 sets - 10 reps - Plank on Long Hand Float with Leg Lift  - 1 x daily - 7 x  weekly - 3 sets - 10 reps - Standing Shoulder Horizontal Abduction Using Hand buoy  - 1 x daily - 7 x weekly - 3 sets - 10 reps - Bilateral Shoulder Flexion Extension with Hand Paddles  - 1 x daily - 7 x weekly - 3 sets - 10 reps  ASSESSMENT:  CLINICAL IMPRESSION: PN: pt demonstrates improvement with toleration to activity submerged and with functional activities at home.  He continues to be limited by pain but has improved with his pain management skills through exercise in aquatics (using the properties of water) as well as understanding of limitations.  He does gain pain relief while submerged which allows for improved physiological wellbeing with reduction in pain which remains lower post sessions. His hip strength has improved as noted in above chart. Pt reports increased ease with transfers in/out chairs and bed meeting LTG.  He will continue to benefit from  skilled aquatic physical therapy with plan to create and instruct on aquatic final HEP.  He will be gaining pool access here at [facility] in next month.       Initial Impression Patient is a 68 y.o. m who was seen today for physical therapy evaluation and treatment for R hip pain which is  a chronic condition originating with LB dysfunction.  He is well know to this clinic as was seen prior to most recent lumbar fusion L1-2 in Feb 2025. He has had 2 other lumbar fusions back in 2022.  Pt states his pain is better than it was prior to surgery but continues to be high and limiting all function. Testing (limited toleration) and subjective information demonstrates strength, balance and activity tolerance deficits. He is having to modify and restrict all ADL's. He is a good candidate for aquatic intervention and will benefit from the properties of water to progress towards functional goals.     OBJECTIVE IMPAIRMENTS: decreased activity tolerance, difficulty walking, decreased balance, decreased endurance, decreased mobility, decreased ROM, decreased strength, impaired flexibility, impaired UE/LE use, postural dysfunction, and pain.   ACTIVITY LIMITATIONS: bending, lifting, carry, locomotion, cleaning, community activity, driving, and or occupation   PERSONAL FACTORS: Arthritis, Back pain, DM, Dyspnea, Gout, Multiple joint pain, LE edema, sleep apnea are also affecting patient's functional outcome.   REHAB POTENTIAL: Good   CLINICAL DECISION MAKING: Evolving/moderate complexity   EVALUATION COMPLEXITY: Moderate   GOALS: Goals reviewed with patient? Yes  SHORT TERM GOALS: Target date: 10/10 Pt will tolerate full aquatic sessions consistently without increase in pain and with improving function to demonstrate good toleration and effectiveness of intervention.  Baseline: Goal status: Met 08/03/24  2.  Pt will have a reduction of LB/hip pain by 50% submerged for evidence of water properties  effect on chronic pain cycle. Baseline:  Goal status:MET - 07/28/24    LONG TERM GOALS: Target date: 09/16/24  Pt to improve on ODI by 13% to demonstrate statistically significant Improvement in function. (MCID 13-15%) Baseline: ODI 29/50=58% Goal status: INITIAL  2.  Pt will improve lumbar ROM to 50% to improve functional mobility Baseline:see chart Goal status: In progress    3.  Pt will report decrease in pain by at least 50% for improved toleration to activity/quality of life and to demonstrate improved management of pain. Baseline: see chart Goal status: In progress 08/17/24  4.  Pt will improve strength in hip flex by 1 grade to demonstrate improved overall physical function Baseline: see chart Goal status: In progress 08/24/24  5.  Pt will report  an increase in ability to transfer in and out of bed and chair Baseline: difficulty Goal status: INITIAL    PLAN:  PT FREQUENCY: 1-2x/week  PT DURATION: 3 weeks  PLANNED INTERVENTIONS: 97164- PT Re-evaluation, 97750- Physical Performance Testing, 97110-Therapeutic exercises, 97530- Therapeutic activity, 97112- Neuromuscular re-education, 97535- Self Care, 02859- Manual therapy, [phone]- Gait training, [phone]- Aquatic Therapy, [phone] (1-2 muscles), 20561 (3+ muscles)- Dry Needling, Patient/Family education, Balance training, Stair training, Taping, Joint mobilization, DME instructions, Cryotherapy, and Moist heat  PLAN FOR NEXT SESSION: aquatics initially:LE/core strengthening and stretching; pain management  Ronal Foots) Almena Hokenson MPT 08/24/24 12:19 PM [facility] [address] [place], [place], [address] Phone: [phone]   Fax:  [phone]    Referring diagnosis? M25.551 (ICD-10-CM) - Pain in right hip  Treatment diagnosis? (if different than referring diagnosis) other low back pain added  What was this (referring dx) caused by? []  Surgery []  Fall [x]  Ongoing  issue [x]  Arthritis []  Other: ____________  Laterality: []  Rt []  Lt [x]  Both  Check all possible CPT codes:  *CHOOSE 10 OR LESS*    See Planned Interventions listed in the Plan section of the Evaluation.

## 2024-08-28 ENCOUNTER — Encounter: Payer: Self-pay | Admitting: *Deleted

## 2024-08-28 ENCOUNTER — Other Ambulatory Visit: Payer: Self-pay | Admitting: *Deleted

## 2024-08-28 ENCOUNTER — Inpatient Hospital Stay: Attending: Internal Medicine

## 2024-08-28 ENCOUNTER — Inpatient Hospital Stay

## 2024-08-28 DIAGNOSIS — E119 Type 2 diabetes mellitus without complications: Secondary | ICD-10-CM | POA: Diagnosis not present

## 2024-08-28 DIAGNOSIS — Z006 Encounter for examination for normal comparison and control in clinical research program: Secondary | ICD-10-CM

## 2024-08-28 LAB — RESEARCH LABS

## 2024-08-28 NOTE — Research (Signed)
 [employer] 2021-05 - Specimen Collection Study to Evaluate Biomarkers in Subjects with Cancer    Patient Luke Larsen was identified by Dr.Gudena as a potential candidate for the above listed study.  This Clinical Research Nurse met with Luke Larsen, [id number] on 08/28/24 in a manner and location that ensures patient privacy to discuss participation in the above listed research study.  Patient is Accompanied by wife.  Patient was previously provided with informed consent documents.  Patient confirmed they have read the informed consent documents.  As outlined in the informed consent form, this Nurse and Luke Larsen discussed the purpose of the research study, the investigational nature of the study, study procedures and requirements for study participation, potential risks and benefits of study participation, as well as alternatives to participation.  This study is not blinded or double-blinded. The patient understands participation is voluntary and they may withdraw from study participation at any time.  This study does not involve randomization.  This study does not involve an investigational drug or device. This study does not involve a placebo. Patient understands enrollment is pending full eligibility review.   Confidentiality and how the patient's information will be used as part of study participation were discussed.  Patient was informed there is reimbursement provided for their time and effort spent on trial participation.  The patient is encouraged to discuss research study participation with their insurance provider to determine what costs they may incur as part of study participation, including research related injury.    All questions were answered to patient's satisfaction.  The informed consent with embedded HIPAA language was reviewed page by page.  The patient's mental and emotional status is appropriate to provide informed consent, and the patient verbalizes an understanding of  study participation.  Patient has agreed to participate in the above listed research study and has voluntarily signed the informed consent version [employer] Approved Version 20 Jun 2024 [employer] Approved Version 20 Jun 2024 on 08/28/24 at 1:30PM.  The patient was provided with a copy of the signed informed consent form with embedded HIPAA language for their reference.  No study specific procedures were obtained prior to the signing of the informed consent document.  Approximately 30 minutes were spent with the patient reviewing the informed consent documents.  Patient was not requested to complete a Release of Information form.   Eligibility: Eligibility criteria reviewed with patient. This nurse/coordinator has reviewed this patient's inclusion and exclusion criteria and confirmed patient is eligible for study participation in the non cancer cohort of study. Eligibility confirmed by treating investigator, Dr.Gudena , who also agrees that patient should proceed with enrollment.   Patient will continue with enrollment.  Medical History: This RN/Coordinator reviewed the medical history as reported in the patient's medical record with the participant.  In addition, the participant was asked to report any new medical conditions not previously recorded on the medical history form.   Was the current medical history form correct?   Yes Are there any new medical conditions to report?  No  Based on the review of the medical chart and the patient's responses, all reportable medical history events will be entered for study reporting purposes.     Data Collection: Patient was interviewed to collect the following information.  Does participant have a history of: High Blood Pressure   Yes  Has participant been diagnosed with: Coronary Artery Disease                No Myocardial Infarction  No Congestive Heart Failure               No Peripheral Vascular Disease           No Cerebrovascular Disease              No Chronic Pulmonary Disease             No COPD (incl Emphysema,Chronic Bronchitis)    No Lupus                               No Rheumatoid Arthritis         Yes Rheumatoid Disease         No  Does participant have a history of: Diabetes                  yes Type 2      If yes, was there end Organ damage?   No        Has participant been diagnosed with: Dementia                         No Hemiplegia or Paraplegia No Barrett's Esophagus       No Gastric Ulcer                 No Peptic Ulcer Disease      No Mild Liver Disease           No Moderate or Severe Liver Disease  No Liver Cirrhosis                                 No Helicobacter Pylori (H. Pylori)          No Pancreatitis                                       No Renal Disease                                No Chronic Kidney Disease (CKD)   No  Ulcerative Colitis     No Crohn's Disease    No Colorectal Polyps   Yes Lynch Syndrome    No Hepatitis B or C     Unknown   Is the participant currently taking a magnesium supplement?   No  Does the participant have a personal history of cancer (greater than 5 years ago)?  No  Does the participant have a family history of cancer in 1st or 2nd degree relatives? Yes If yes, Relationship(s) and Cancer type(s)?  Mom- basal cell   Does the participant have history of alcohol consumption? No    Does the participant have a history of cigarette, cigar, pipe, or chewing tobacco use?  No   Blood Collection: Research blood obtained by fresh venipuncture (or Port a Cath per patient's preference). Patient tolerated well without any adverse events.  Gift Card: $50 gift card given to patient by Clinical Research Specialist Macy for her participation in this study.      Patient was thanked for their participation in this study.    Luke Larsen, RN, BSN Clinical Research Nurse [phone] 08/28/2024

## 2024-08-29 NOTE — Research (Signed)
 [employer] 2021-05 - Specimen Collection Study to Evaluate Biomarkers in Subjects with Cancer    This Coordinator has reviewed this patient's inclusion and exclusion criteria as a second review and confirms patient is eligible for study participation. Patient may continue with enrollment.

## 2024-08-30 ENCOUNTER — Ambulatory Visit (HOSPITAL_BASED_OUTPATIENT_CLINIC_OR_DEPARTMENT_OTHER): Payer: Self-pay | Admitting: Physical Therapy

## 2024-08-30 ENCOUNTER — Encounter (HOSPITAL_BASED_OUTPATIENT_CLINIC_OR_DEPARTMENT_OTHER): Payer: Self-pay | Admitting: Physical Therapy

## 2024-08-30 DIAGNOSIS — M6281 Muscle weakness (generalized): Secondary | ICD-10-CM

## 2024-08-30 DIAGNOSIS — M25551 Pain in right hip: Secondary | ICD-10-CM | POA: Diagnosis not present

## 2024-08-30 DIAGNOSIS — R252 Cramp and spasm: Secondary | ICD-10-CM | POA: Diagnosis not present

## 2024-08-30 DIAGNOSIS — M5459 Other low back pain: Secondary | ICD-10-CM | POA: Diagnosis not present

## 2024-08-30 NOTE — Therapy (Signed)
 OUTPATIENT PHYSICAL THERAPY LOWER EXTREMITY TREATMENT       Patient Name: Luke Larsen MRN: [id number] DOB:11/27/1955, 68 y.o., male Today's Date: 08/30/2024  END OF SESSION:  PT End of Session - 08/30/24 0728     Visit Number 10    Number of Visits 12    Date for Recertification  09/15/24    Authorization Type [employer]    Authorization Time Period 11/29    Authorization - Number of Visits 12    Progress Note Due on Visit 19    PT Start Time 0715    PT Stop Time 0800    PT Time Calculation (min) 45 min    Activity Tolerance Patient tolerated treatment well    Behavior During Therapy WFL for tasks assessed/performed          Past Medical History:  Diagnosis Date   Arthritis    Back pain    Constipation    Diabetes mellitus without complication (HCC)    Dyspnea    Gout    Hip pain    History of kidney stones    Hypercholesterolemia    Hypertension    Joint pain    Lower extremity edema    Neck pain    Shoulder pain    Sleep apnea    Vitamin D  deficiency    Past Surgical History:  Procedure Laterality Date   ANTERIOR LAT LUMBAR FUSION Left 12/05/2020   Procedure: LEFT LATERAL INTERBODY FUSION LUMBAR 2 - LUMBAR 3 WITH INSTRUMENTATION AND ALLOGRAFT;  Surgeon: Beuford Anes, MD;  Location: [facility] OR;  Service: Orthopedics;  Laterality: Left;   ANTERIOR LAT LUMBAR FUSION Right 09/10/2021   Procedure: RIGHT LATERAL LUMBAR FUSION LUMBAR 3- LUMBAR 4 WITH INSTRUMENTATION AND ALLOGRAFT;  Surgeon: Beuford Anes, MD;  Location: [facility] OR;  Service: Orthopedics;  Laterality: Right;   BACK SURGERY     04/2019   FRACTURE SURGERY Left 1972   knee   KIDNEY STONE SURGERY  03/28/2012   POSTERIOR FUSION PEDICLE SCREW PLACEMENT N/A 12/10/2023   Procedure: PLIF - L1-L2 - Posterior Lateral and Interbody fusion - T10 - L2;  Surgeon: Gillie Duncans, MD;  Location: [facility] OR;  Service: Neurosurgery;  Laterality: N/A;  3C   Patient Active Problem List   Diagnosis Date Noted   Lumbar  adjacent segment disease with spondylolisthesis 12/10/2023   Spinal stenosis 09/10/2021   Radiculopathy 12/05/2020   Facet arthropathy, lumbar 03/29/2019   Degenerative spondylolisthesis 03/29/2019   Chronic right-sided low back pain with right-sided sciatica 03/29/2019   Apnea, sleep 05/19/2014   Calculus of left ureter 05/19/2014   ED (erectile dysfunction) of organic origin 02/28/2014   Anxiety state 04/19/2013   Obesity, Class III, BMI 40-49.9 (morbid obesity) (HCC) 04/19/2013   Gastric ulcer by EGD 04/19/2013   History of colonic polyps 04/19/2013   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  POOL ACCESS: plans to join [facility] at d/c.   Initial Subjective Surgery L1-21 Nov 2023.  Did help the pain but didn't fix.  Had a myelogram 2 weeks ago. Didn't find anything.  Had steroid shot but did not touch my pain.  Nothing helps my pain if I am moving. If I recline it will decrease after a while but doesn't go away. Cabbell said he needs to converse with colleagues and get back to me he doesn't know what to do. Pt has a 3/4 inch off set from L>R due to injury in football.  I want you to work me I don't care how much it works so I get stronger and my pain reduces  PERTINENT HISTORY: Lumbar spondylolisthesis Lumbar fusions 2022 PLIF L1-2 11/2023 PAIN:  Are you having pain? Yes: NPRS scale: current 6/10;  Pain location: generalized  Pain description:  ache, numb, cramped  Aggravating factors: movement 5 minutes Relieving factors: recliner  PRECAUTIONS: None  RED FLAGS: None   WEIGHT BEARING RESTRICTIONS: No  FALLS:  Has patient fallen in last 6 months? No  LIVING ENVIRONMENT: Lives with: lives with their spouse Lives in: House/apartment Stairs: Yes: Internal: 5 steps; on right going up and External: 12 steps; bilateral but cannot reach both Has following equipment at home: None   OCCUPATION: Transport planner    PLOF: Independent  PATIENT GOALS: build strength in core to reduce pain  NEXT MD VISIT: as needed  OBJECTIVE:  Note: Objective measures were completed at Evaluation unless otherwise noted.  DIAGNOSTIC FINDINGS: MRI lumbar spine IMPRESSION: 1. Posterior instrumented fusion at L1-2 and L3-4 with interbody spacers. Mild lucency around the left L1 pedicle screw concerning for hardware loosening. Mild subsidence around the L3-4 interbody spacer. 2. Solid osseous fusion across the disc spaces at L1-2, L2-3, and L3-4. 3. Degenerative changes as above. No high-grade spinal canal stenosis. Moderate foraminal stenosis on the left at L5-S1.  PATIENT SURVEYS:  ODI 29/50=58%  COGNITION: Overall cognitive status: Within functional limits for tasks assessed     SENSATION: WFL    POSTURE: rounded shoulders, forward head, decreased lumbar lordosis, left pelvic obliquity, and flexed trunk guarded  PALPATION: Moderate TTP throughout right lumbar paraspinals   LOWER EXTREMITY ROM:  Active ROM Right eval Left eval R / L 08/24/24  Hip flexion Limited 50% P! Limited 50% P! Limited 25% bilat P! But reduced  Hip extension     Hip abduction     Hip adduction     Hip internal rotation     Hip external rotation     Knee flexion     Knee extension     Ankle dorsiflexion     Ankle plantarflexion     Ankle inversion     Ankle eversion      (Blank rows = not tested)  LOWER EXTREMITY MMT:  MMT Right eval Left eval  R / L 08/24/24  Hip flexion 3+ 4- 4- / 4+  Hip extension     Hip abduction 5 5   Hip adduction     Hip internal rotation     Hip external rotation     Knee flexion 5 5   Knee extension 5 5   Ankle dorsiflexion 5 5   Ankle plantarflexion     Ankle inversion     Ankle eversion      (Blank rows = not tested)  LOWER EXTREMITY SPECIAL TESTS:  Not tolerated  FUNCTIONAL TESTS:  Not tolerated  GAIT: Distance walked: 400 ft Assistive device utilized: None Level of assistance: Complete Independence Comments: antalgic  TREATMENT  [facility] Adult PT Treatment:                                                DATE: 08/24/24 Pt seen for aquatic therapy today.  Treatment took place in water 3.5-4.75 ft in depth at the [facility] pool. Temp of water was 91.  Pt entered/exited the pool via stairs using step to pattern with hand rail.   Exercises - Active Hamstring Stretch on Pool Wall   - Bow and Arrow   - Noodle press   - Standing Shoulder Horizontal Abduction Using Hand buoy   - Side Stepping with Hand Floats  - Cycling on noodle/noodle wrapped under shoulders    - staggered stance with reciprocal arm swing using red hand bells 3 sets of 5 slow/5 fast *walking backwards/forwards, unsupported between exercises for relief.   Pt requires the buoyancy and hydrostatic pressure of water for support, and to offload joints by unweighting joint load by at least 50 % in navel deep water and by at least 75-80% in chest to neck deep water.  Viscosity of the water is needed for resistance of strengthening. Water current perturbations provides challenge to standing balance requiring increased core activation.       PATIENT EDUCATION:  Education details: reacquainting to aquatic therapy   Person educated: Patient Education method: Explanation Education  comprehension: verbalized understanding  HOME EXERCISE PROGRAM: [email or web address]  Aquatic This aquatic home exercise program from [employer] utilizes pictures from land based exercises, but has been adapted prior to lamination and issuance.   Access Code: [email or web address] URL: [email or web address] Date: 08/24/2024 Prepared by: Matilda Kohut  Exercises - Active Hamstring Stretch on Pool Wall  - 1 x daily - 7 x weekly - 3 sets - 10 reps - Bow and Arrow   - 1 x daily - 7 x weekly - 3 sets - 10 reps - Noodle press  - 1 x daily - 7 x weekly - 3 sets - 10 reps - Side Stepping with Hand Floats  - 1 x daily - 7 x weekly - 3 sets - 10 reps - Cycling on noodle/noodle wrapped under shoulders  - 1 x daily - 7 x weekly - 3 sets - 10 reps - Push-Off on Pool Wall  - 1 x daily - 7 x weekly - 3 sets - 10 reps - Plank on Long [employer] with Leg Lift  - 1 x daily - 7 x weekly - 3 sets - 10 reps - Standing Shoulder Horizontal Abduction Using Hand buoy  - 1 x daily - 7 x weekly - 3 sets - 10 reps - Bilateral Shoulder Flexion Extension with Hand Paddles  - 1 x daily - 7 x weekly - 3 sets - 10 reps  ASSESSMENT:  CLINICAL IMPRESSION: Modified exercises due to increase in LBP. Instructed pt to reach back out to spine MD for return appt as aquatic PT intervention to dc soon as he is reaching his max potential in setting.  Final HEP created. Pt completes ~ 75% of program today which was adequate.  He is instructed on variations of exercises to either increase/decrease challenge.  He vu to be mindful of positioning/posture with exercise to maximize benefit and minimize risk of increase pain. HEP issued but not yet completely instructed. Goals ongoing.    PN: pt demonstrates improvement with  toleration to activity submerged and with functional activities at home.  He continues to be limited by pain but has improved with his pain management skills through exercise in aquatics (using the properties of water) as well  as understanding of limitations.  He does gain pain relief while submerged which allows for improved physiological wellbeing with reduction in pain which remains lower post sessions. His hip strength has improved as noted in above chart. Pt reports increased ease with transfers in/out chairs and bed meeting LTG.  He will continue to benefit from skilled aquatic physical therapy with plan to create and instruct on aquatic final HEP.  He will be gaining pool access here at [facility] in next month.     OBJECTIVE IMPAIRMENTS: decreased activity tolerance, difficulty walking, decreased balance, decreased endurance, decreased mobility, decreased ROM, decreased strength, impaired flexibility, impaired UE/LE use, postural dysfunction, and pain.   ACTIVITY LIMITATIONS: bending, lifting, carry, locomotion, cleaning, community activity, driving, and or occupation   PERSONAL FACTORS: Arthritis, Back pain, DM, Dyspnea, Gout, Multiple joint pain, LE edema, sleep apnea are also affecting patient's functional outcome.   REHAB POTENTIAL: Good   CLINICAL DECISION MAKING: Evolving/moderate complexity   EVALUATION COMPLEXITY: Moderate   GOALS: Goals reviewed with patient? Yes  SHORT TERM GOALS: Target date: 10/10 Pt will tolerate full aquatic sessions consistently without increase in pain and with improving function to demonstrate good toleration and effectiveness of intervention.  Baseline: Goal status: Met 08/03/24  2.  Pt will have a reduction of LB/hip pain by 50% submerged for evidence of water properties effect on chronic pain cycle. Baseline:  Goal status:MET - 07/28/24    LONG TERM GOALS: Target date: 09/16/24  Pt to improve on ODI by 13% to demonstrate statistically significant Improvement in function. (MCID 13-15%) Baseline: ODI 29/50=58% Goal status: INITIAL  2.  Pt will improve lumbar ROM to 50% to improve functional mobility Baseline:see chart Goal status: In progress    3.  Pt will  report decrease in pain by at least 50% for improved toleration to activity/quality of life and to demonstrate improved management of pain. Baseline: see chart Goal status: In progress 08/17/24  4.  Pt will improve strength in hip flex by 1 grade to demonstrate improved overall physical function Baseline: see chart Goal status: In progress 08/24/24  5.  Pt will report an increase in ability to transfer in and out of bed and chair Baseline: difficulty Goal status: INITIAL    PLAN:  PT FREQUENCY: 1-2x/week  PT DURATION: 3 weeks  PLANNED INTERVENTIONS: 97164- PT Re-evaluation, 97750- Physical Performance Testing, 97110-Therapeutic exercises, 97530- Therapeutic activity, 97112- Neuromuscular re-education, 97535- Self Care, [id number]- Manual therapy, [id number]- Gait training, [id number]- Aquatic Therapy, [phone] (1-2 muscles), 20561 (3+ muscles)- Dry Needling, Patient/Family education, Balance training, Stair training, Taping, Joint mobilization, DME instructions, Cryotherapy, and Moist heat  PLAN FOR NEXT SESSION: aquatics initially:LE/core strengthening and stretching; pain management  Luke Larsen) Luke Larsen MPT 08/30/24 8:05 AM [facility] [address] [place], [place], [address] Phone: [phone]   Fax:  [phone]    Referring diagnosis? M25.551 (ICD-10-CM) - Pain in right hip  Treatment diagnosis? (if different than referring diagnosis) other low back pain added  What was this (referring dx) caused by? []  Surgery []  Fall [x]  Ongoing issue [x]  Arthritis []  Other: ____________  Laterality: []  Rt []  Lt [x]  Both  Check all possible CPT codes:  *CHOOSE 10 OR LESS*    See Planned Interventions listed in the  Plan section of the Evaluation.

## 2024-09-04 ENCOUNTER — Ambulatory Visit (HOSPITAL_BASED_OUTPATIENT_CLINIC_OR_DEPARTMENT_OTHER): Payer: Self-pay | Admitting: Physical Therapy

## 2024-09-04 ENCOUNTER — Encounter (HOSPITAL_BASED_OUTPATIENT_CLINIC_OR_DEPARTMENT_OTHER): Payer: Self-pay | Admitting: Physical Therapy

## 2024-09-04 DIAGNOSIS — M6281 Muscle weakness (generalized): Secondary | ICD-10-CM

## 2024-09-04 DIAGNOSIS — R252 Cramp and spasm: Secondary | ICD-10-CM

## 2024-09-04 DIAGNOSIS — M25551 Pain in right hip: Secondary | ICD-10-CM

## 2024-09-04 DIAGNOSIS — M5459 Other low back pain: Secondary | ICD-10-CM

## 2024-09-04 NOTE — Therapy (Signed)
 OUTPATIENT PHYSICAL THERAPY LOWER EXTREMITY TREATMENT       Patient Name: Luke Larsen MRN: [id number] DOB:22-Jul-1956, 68 y.o., male Today's Date: 09/04/2024  END OF SESSION:  PT End of Session - 09/04/24 1149     Visit Number 11    Number of Visits 12    Date for Recertification  09/15/24    Authorization Type [employer]    Authorization Time Period 11/29    Authorization - Visit Number 11    Authorization - Number of Visits 12    Progress Note Due on Visit 19    PT Start Time 1145    PT Stop Time 1225    PT Time Calculation (min) 40 min    Activity Tolerance Patient tolerated treatment well    Behavior During Therapy WFL for tasks assessed/performed          Past Medical History:  Diagnosis Date   Arthritis    Back pain    Constipation    Diabetes mellitus without complication (HCC)    Dyspnea    Gout    Hip pain    History of kidney stones    Hypercholesterolemia    Hypertension    Joint pain    Lower extremity edema    Neck pain    Shoulder pain    Sleep apnea    Vitamin D  deficiency    Past Surgical History:  Procedure Laterality Date   ANTERIOR LAT LUMBAR FUSION Left 12/05/2020   Procedure: LEFT LATERAL INTERBODY FUSION LUMBAR 2 - LUMBAR 3 WITH INSTRUMENTATION AND ALLOGRAFT;  Surgeon: Beuford Anes, MD;  Location: [facility] OR;  Service: Orthopedics;  Laterality: Left;   ANTERIOR LAT LUMBAR FUSION Right 09/10/2021   Procedure: RIGHT LATERAL LUMBAR FUSION LUMBAR 3- LUMBAR 4 WITH INSTRUMENTATION AND ALLOGRAFT;  Surgeon: Beuford Anes, MD;  Location: [facility] OR;  Service: Orthopedics;  Laterality: Right;   BACK SURGERY     04/2019   FRACTURE SURGERY Left 1972   knee   KIDNEY STONE SURGERY  03/28/2012   POSTERIOR FUSION PEDICLE SCREW PLACEMENT N/A 12/10/2023   Procedure: PLIF - L1-L2 - Posterior Lateral and Interbody fusion - T10 - L2;  Surgeon: Gillie Duncans, MD;  Location: [facility] OR;  Service: Neurosurgery;  Laterality: N/A;  3C   Patient Active Problem List    Diagnosis Date Noted   Lumbar adjacent segment disease with spondylolisthesis 12/10/2023   Spinal stenosis 09/10/2021   Radiculopathy 12/05/2020   Facet arthropathy, lumbar 03/29/2019   Degenerative spondylolisthesis 03/29/2019   Chronic right-sided low back pain with right-sided sciatica 03/29/2019   Apnea, sleep 05/19/2014   Calculus of left ureter 05/19/2014   ED (erectile dysfunction) of organic origin 02/28/2014   Anxiety state 04/19/2013   Obesity, Class III, BMI 40-49.9 (morbid obesity) (HCC) 04/19/2013   Gastric ulcer by EGD 04/19/2013   History of colonic polyps 04/19/2013   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  Pain 7/10  POOL ACCESS: plans  to join [facility] at d/c.   Initial Subjective Surgery L1-21 Nov 2023.  Did help the pain but didn't fix.  Had a myelogram 2 weeks ago. Didn't find anything.  Had steroid shot but did not touch my pain.  Nothing helps my pain if I am moving. If I recline it will decrease after a while but doesn't go away. Cabbell said he needs to converse with colleagues and get back to me he doesn't know what to do. Pt has a 3/4 inch off set from L>R due to injury in football.  I want you to work me I don't care how much it works so I get stronger and my pain reduces  PERTINENT HISTORY: Lumbar spondylolisthesis Lumbar fusions 2022 PLIF L1-2 11/2023 PAIN:  Are you having pain? Yes: NPRS scale: current 6/10;  Pain  location: generalized  Pain description: ache, numb, cramped  Aggravating factors: movement 5 minutes Relieving factors: recliner  PRECAUTIONS: None  RED FLAGS: None   WEIGHT BEARING RESTRICTIONS: No  FALLS:  Has patient fallen in last 6 months? No  LIVING ENVIRONMENT: Lives with: lives with their spouse Lives in: House/apartment Stairs: Yes: Internal: 5 steps; on right going up and External: 12 steps; bilateral but cannot reach both Has following equipment at home: None   OCCUPATION: Transport planner    PLOF: Independent  PATIENT GOALS: build strength in core to reduce pain  NEXT MD VISIT: as needed  OBJECTIVE:  Note: Objective measures were completed at Evaluation unless otherwise noted.  DIAGNOSTIC FINDINGS: MRI lumbar spine IMPRESSION: 1. Posterior instrumented fusion at L1-2 and L3-4 with interbody spacers. Mild lucency around the left L1 pedicle screw concerning for hardware loosening. Mild subsidence around the L3-4 interbody spacer. 2. Solid osseous fusion across the disc spaces at L1-2, L2-3, and L3-4. 3. Degenerative changes as above. No high-grade spinal canal stenosis. Moderate foraminal stenosis on the left at L5-S1.  PATIENT SURVEYS:  ODI 29/50=58%  COGNITION: Overall cognitive status: Within functional limits for tasks assessed     SENSATION: WFL    POSTURE: rounded shoulders, forward head, decreased lumbar lordosis, left pelvic obliquity, and flexed trunk guarded  PALPATION: Moderate TTP throughout right lumbar paraspinals   LOWER EXTREMITY ROM:  Active ROM Right eval Left eval R / L 08/24/24  Hip flexion Limited 50% P! Limited 50% P! Limited 25% bilat P! But reduced  Hip extension     Hip abduction     Hip adduction     Hip internal rotation     Hip external rotation     Knee flexion     Knee extension     Ankle dorsiflexion     Ankle plantarflexion     Ankle inversion     Ankle eversion      (Blank rows = not tested)  LOWER  EXTREMITY MMT:  MMT Right eval Left eval R / L 08/24/24  Hip flexion 3+ 4- 4- / 4+ P!  Hip extension     Hip abduction 5 5   Hip adduction     Hip internal rotation     Hip external rotation     Knee flexion 5 5   Knee extension 5 5   Ankle dorsiflexion 5 5   Ankle plantarflexion     Ankle inversion     Ankle eversion      (Blank rows = not tested)  LOWER EXTREMITY SPECIAL TESTS:  Not tolerated  FUNCTIONAL TESTS:  Not tolerated  GAIT: Distance walked: 400  ft Assistive device utilized: None Level of assistance: Complete Independence Comments: antalgic                                                                                                                                TREATMENT  [facility] Adult PT Treatment:                                                DATE: 09/04/24 Pt seen for aquatic therapy today.  Treatment took place in water 3.5-4.75 ft in depth at the [facility] pool. Temp of water was 91.  Pt entered/exited the pool via stairs using step to pattern with hand rail.   Exercises - walking forward/ back and side stepping 4.6 ft - Cycling on noodle/noodle wrapped under shoulders  - staggered stance with reciprocal arm swing using blue hand bells 3 sets of 5 slow/5 fast -Suitcase carry using blue HB - Bow and Arrow   - Noodle press   -Chief Operating Officer on Pool Wall   Push-Off on Pool Wall    - Standing Shoulder Horizontal Abduction Using Hand buoy   - Side Stepping with Hand Floats    *walking backwards/forwards, unsupported between exercises for relief.   Pt requires the buoyancy and hydrostatic pressure of water for support, and to offload joints by unweighting joint load by at least 50 % in navel deep water and by at least 75-80% in chest to neck deep water.  Viscosity of the water is needed for resistance of strengthening. Water current perturbations provides challenge to standing balance requiring increased core activation.        PATIENT EDUCATION:  Education details: reacquainting to aquatic therapy   Person educated: Patient Education method: Explanation Education comprehension: verbalized understanding  HOME EXERCISE PROGRAM: [email or web address]  Aquatic This aquatic home exercise program from [employer] utilizes pictures from land based exercises, but has been adapted prior to lamination and issuance.   Access Code: [email or web address] URL: [email or web address] Date: 08/24/2024 Prepared by: Matilda Kohut  Exercises - Active Hamstring Stretch on Pool Wall  - 1 x daily - 7 x weekly - 3 sets - 10 reps - Bow and Arrow   - 1 x daily - 7 x weekly - 3 sets - 10 reps - Noodle press  - 1 x daily - 7 x weekly - 3 sets - 10 reps - Side Stepping with Hand Floats  - 1 x daily - 7 x weekly - 3 sets - 10 reps - Cycling on noodle/noodle wrapped under shoulders  - 1 x daily - 7 x weekly - 3 sets - 10 reps - Push-Off on Pool Wall  - 1 x daily - 7 x weekly - 3 sets - 10 reps - Plank on Long Hand Float with Leg Lift  - 1 x  daily - 7 x weekly - 3 sets - 10 reps - Standing Shoulder Horizontal Abduction Using Hand buoy  - 1 x daily - 7 x weekly - 3 sets - 10 reps - Bilateral Shoulder Flexion Extension with hand buoys  - 1 x daily - 7 x weekly - 3 sets - 10 reps  ASSESSMENT:  CLINICAL IMPRESSION: Pt with guarded posture at onset of session. Cuing for relaxed shoulder positioning to assist in reduction of pain. He requires multiple rest period throughout to allow for LB stretching. Was unable to get through entire HEP. He does report he feels the exercises have become easier as the sessions have progressed as he feels strength gain. Pt states he continues to have difficulty getting out of bed in am but he has no problems with risding from chairs. Added a slight lunge to his side stepping with good challenge.  Pt has just about reached his max potential in setting.  He will benefit from a last session for final instruction to  assure indep with aquatic HEP.Plan to dc next session.      PN: pt demonstrates improvement with toleration to activity submerged and with functional activities at home.  He continues to be limited by pain but has improved with his pain management skills through exercise in aquatics (using the properties of water) as well as understanding of limitations.  He does gain pain relief while submerged which allows for improved physiological wellbeing with reduction in pain which remains lower post sessions. His hip strength has improved as noted in above chart. Pt reports increased ease with transfers in/out chairs and bed meeting LTG.  He will continue to benefit from skilled aquatic physical therapy with plan to create and instruct on aquatic final HEP.  He will be gaining pool access here at [facility] in next month.     OBJECTIVE IMPAIRMENTS: decreased activity tolerance, difficulty walking, decreased balance, decreased endurance, decreased mobility, decreased ROM, decreased strength, impaired flexibility, impaired UE/LE use, postural dysfunction, and pain.   ACTIVITY LIMITATIONS: bending, lifting, carry, locomotion, cleaning, community activity, driving, and or occupation   PERSONAL FACTORS: Arthritis, Back pain, DM, Dyspnea, Gout, Multiple joint pain, LE edema, sleep apnea are also affecting patient's functional outcome.   REHAB POTENTIAL: Good   CLINICAL DECISION MAKING: Evolving/moderate complexity   EVALUATION COMPLEXITY: Moderate   GOALS: Goals reviewed with patient? Yes  SHORT TERM GOALS: Target date: 10/10 Pt will tolerate full aquatic sessions consistently without increase in pain and with improving function to demonstrate good toleration and effectiveness of intervention.  Baseline: Goal status: Met 08/03/24  2.  Pt will have a reduction of LB/hip pain by 50% submerged for evidence of water properties effect on chronic pain cycle. Baseline:  Goal status:MET -  07/28/24    LONG TERM GOALS: Target date: 09/16/24  Pt to improve on ODI by 13% to demonstrate statistically significant Improvement in function. (MCID 13-15%) Baseline: ODI 29/50=58% Goal status: INITIAL  2.  Pt will improve lumbar ROM to 50% to improve functional mobility Baseline:see chart Goal status: In progress; Met as of 08/24/24   3.  Pt will report decrease in pain by at least 50% for improved toleration to activity/quality of life and to demonstrate improved management of pain. Baseline: see chart Goal status: In progress 08/17/24; Progress made but Not Met due to chronicity of condition. 08/25/24  4.  Pt will improve strength in hip flex by 1 grade to demonstrate improved overall physical function Baseline: see chart  Goal status: In progress 08/24/24; Not Met Pt continues to be limited by pain 09/04/24  5.  Pt will report an increase in ability to transfer in and out of bed and chair Baseline: difficulty Goal status: Partially met 09/04/24    PLAN:  PT FREQUENCY: 1-2x/week  PT DURATION: 3 weeks  PLANNED INTERVENTIONS: [id number]- PT Re-evaluation, 97750- Physical Performance Testing, 97110-Therapeutic exercises, 97530- Therapeutic activity, 97112- Neuromuscular re-education, 97535- Self Care, [id number]- Manual therapy, [id number]- Gait training, [phone]- Aquatic Therapy, [phone] (1-2 muscles), [id number] (3+ muscles)- Dry Needling, Patient/Family education, Balance training, Stair training, Taping, Joint mobilization, DME instructions, Cryotherapy, and Moist heat  PLAN FOR NEXT SESSION: DC  Ronal Foots) Eugina Row MPT 09/04/24 12:08 PM [facility] [address] [place], [place], [address] Phone: [phone]   Fax:  [phone]    Referring diagnosis? M25.551 (ICD-10-CM) - Pain in right hip  Treatment diagnosis? (if different than referring diagnosis) other low back pain added  What was this (referring dx) caused by? []  Surgery []   Fall [x]  Ongoing issue [x]  Arthritis []  Other: ____________  Laterality: []  Rt []  Lt [x]  Both  Check all possible CPT codes:  *CHOOSE 10 OR LESS*    See Planned Interventions listed in the Plan section of the Evaluation.

## 2024-09-07 DIAGNOSIS — G4733 Obstructive sleep apnea (adult) (pediatric): Secondary | ICD-10-CM | POA: Diagnosis not present

## 2024-09-08 DIAGNOSIS — F1120 Opioid dependence, uncomplicated: Secondary | ICD-10-CM | POA: Diagnosis not present

## 2024-09-08 DIAGNOSIS — M5431 Sciatica, right side: Secondary | ICD-10-CM | POA: Diagnosis not present

## 2024-09-08 DIAGNOSIS — M545 Low back pain, unspecified: Secondary | ICD-10-CM | POA: Diagnosis not present

## 2024-09-08 DIAGNOSIS — Z79899 Other long term (current) drug therapy: Secondary | ICD-10-CM | POA: Diagnosis not present

## 2024-09-08 DIAGNOSIS — Z6841 Body Mass Index (BMI) 40.0 and over, adult: Secondary | ICD-10-CM | POA: Diagnosis not present

## 2024-09-12 ENCOUNTER — Ambulatory Visit (HOSPITAL_BASED_OUTPATIENT_CLINIC_OR_DEPARTMENT_OTHER): Payer: Self-pay | Admitting: Physical Therapy

## 2024-09-12 ENCOUNTER — Encounter (HOSPITAL_BASED_OUTPATIENT_CLINIC_OR_DEPARTMENT_OTHER): Payer: Self-pay | Admitting: Physical Therapy

## 2024-09-12 DIAGNOSIS — R252 Cramp and spasm: Secondary | ICD-10-CM | POA: Diagnosis not present

## 2024-09-12 DIAGNOSIS — M5459 Other low back pain: Secondary | ICD-10-CM

## 2024-09-12 DIAGNOSIS — M6281 Muscle weakness (generalized): Secondary | ICD-10-CM | POA: Diagnosis not present

## 2024-09-12 DIAGNOSIS — M25551 Pain in right hip: Secondary | ICD-10-CM

## 2024-09-12 NOTE — Therapy (Addendum)
 " OUTPATIENT PHYSICAL THERAPY LOWER EXTREMITY TREATMENT PHYSICAL THERAPY DISCHARGE SUMMARY  Visits from Start of Care: 12  Current functional level related to goals / functional outcomes: Indep with AD   Remaining deficits: Highly sensitive chronic pain   Education / Equipment: Management of condition/HEP   Patient agrees to discharge. Patient goals were partially met. Patient is being discharged due to maximized rehab potential.   addend Luke Larsen) Luke Larsen 11/15/24 10:57 AM [facility] [address] [place], [place], [address] Phone: [phone]   Fax:  [phone]     Patient Name: Luke Larsen MRN: [id number] DOB:[birth year], 68 y.o., male Today's Date: 09/12/2024  END OF SESSION:  PT End of Session - 09/12/24 1349     Visit Number 12    Number of Visits 12    Date for Recertification  09/15/24    Authorization Type [employer]    Authorization Time Period 11/29    Authorization - Number of Visits 12    Progress Note Due on Visit 19    PT Start Time 0715    PT Stop Time 0800    PT Time Calculation (min) 45 min    Activity Tolerance Patient limited by pain    Behavior During Therapy [facility] for tasks assessed/performed           Past Medical History:  Diagnosis Date   Arthritis    Back pain    Constipation    Diabetes mellitus without complication (HCC)    Dyspnea    Gout    Hip pain    History of kidney stones    Hypercholesterolemia    Hypertension    Joint pain    Lower extremity edema    Neck pain    Shoulder pain    Sleep apnea    Vitamin D  deficiency    Past Surgical History:  Procedure Laterality Date   ANTERIOR LAT LUMBAR FUSION Left 12/05/2020   Procedure: LEFT LATERAL INTERBODY FUSION LUMBAR 2 - LUMBAR 3 WITH INSTRUMENTATION AND ALLOGRAFT;  Surgeon: Beuford Anes, MD;  Location: [facility] OR;  Service: Orthopedics;  Laterality: Left;   ANTERIOR LAT LUMBAR FUSION Right 09/10/2021    Procedure: RIGHT LATERAL LUMBAR FUSION LUMBAR 3- LUMBAR 4 WITH INSTRUMENTATION AND ALLOGRAFT;  Surgeon: Beuford Anes, MD;  Location: [facility] OR;  Service: Orthopedics;  Laterality: Right;   BACK SURGERY     04/2019   FRACTURE SURGERY Left 1972   knee   KIDNEY STONE SURGERY  03/28/2012   POSTERIOR FUSION PEDICLE SCREW PLACEMENT N/A 12/10/2023   Procedure: PLIF - L1-L2 - Posterior Lateral and Interbody fusion - T10 - L2;  Surgeon: Gillie Duncans, MD;  Location: [facility] OR;  Service: Neurosurgery;  Laterality: N/A;  3C   Patient Active Problem List   Diagnosis Date Noted   Lumbar adjacent segment disease with spondylolisthesis 12/10/2023   Spinal stenosis 09/10/2021   Radiculopathy 12/05/2020   Facet arthropathy, lumbar 03/29/2019   Degenerative spondylolisthesis 03/29/2019   Chronic right-sided low back pain with right-sided sciatica 03/29/2019   Apnea, sleep 05/19/2014   Calculus of left ureter 05/19/2014   ED (erectile dysfunction) of organic origin 02/28/2014   Anxiety state 04/19/2013   Obesity, Class III, BMI 40-49.9 (morbid obesity) (HCC) 04/19/2013   Gastric ulcer by EGD 04/19/2013   History of colonic polyps 04/19/2013   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 Plans to get membership to [employer].   POOL ACCESS: plans to join [employer] at d/c.   Initial Subjective Surgery L1-21 Nov 2023.  Did help the pain but didn't fix.  Had a  myelogram 2 weeks ago. Didn't find anything.  Had steroid shot but did not touch my pain.  Nothing helps my pain if I am moving. If I recline it will decrease after a while but doesn't go away. Cabbell said he needs to converse with colleagues and get back to me he doesn't know what to do. Pt has a 3/4 inch off set from L>R due to injury in football.  I want you to work me I don't care how much it works so I get stronger and my pain reduces  PERTINENT HISTORY: Lumbar spondylolisthesis Lumbar fusions 2022 PLIF L1-2 11/2023 PAIN:  Are you having pain? Yes: NPRS scale: current 8/10 Pain location: generalized  Pain description: ache, numb, cramped, constant  Aggravating factors: movement 5 minutes Relieving factors: recliner  PRECAUTIONS: None  RED FLAGS: None   WEIGHT BEARING RESTRICTIONS: No  FALLS:  Has patient fallen in last 6 months? No  LIVING ENVIRONMENT: Lives with: lives with their spouse Lives in: House/apartment Stairs: Yes: Internal: 5 steps; on right going up and External: 12 steps; bilateral but cannot reach both Has following equipment at home: None   OCCUPATION: Transport planner    PLOF: Independent  PATIENT GOALS: build strength in core to reduce pain  NEXT MD VISIT: as needed  OBJECTIVE:  Note: Objective measures were completed at Evaluation unless otherwise noted.  DIAGNOSTIC FINDINGS: MRI lumbar spine IMPRESSION: 1. Posterior instrumented fusion at L1-2 and L3-4 with interbody spacers. Mild lucency around the left L1 pedicle screw concerning for hardware loosening. Mild subsidence around the L3-4 interbody spacer. 2. Solid osseous fusion across the disc spaces at L1-2, L2-3, and L3-4. 3. Degenerative changes as above. No high-grade spinal canal stenosis. Moderate foraminal stenosis on the left at L5-S1.  PATIENT SURVEYS:  ODI 29/50=58%  09/12/24.  ODI 23/50=46%  COGNITION: Overall cognitive status: Within functional limits for tasks  assessed     SENSATION: WFL    POSTURE: rounded shoulders, forward head, decreased lumbar lordosis, left pelvic obliquity, and flexed trunk guarded  PALPATION: Moderate TTP throughout right lumbar paraspinals   LOWER EXTREMITY ROM:  Active ROM Right eval Left eval R / L 08/24/24  Hip flexion Limited 50% P! Limited 50% P! Limited 25% bilat P! But reduced  Hip extension     Hip abduction     Hip adduction     Hip internal rotation     Hip external rotation     Knee flexion     Knee extension     Ankle dorsiflexion     Ankle plantarflexion     Ankle inversion     Ankle eversion      (Blank rows = not tested)  LOWER EXTREMITY MMT:  MMT Right eval Left eval R / L 08/24/24  Hip flexion 3+ 4- 4- / 4+ P!  Hip extension     Hip abduction 5 5   Hip adduction     Hip internal rotation     Hip external rotation     Knee flexion 5 5   Knee extension 5 5   Ankle dorsiflexion 5 5   Ankle plantarflexion     Ankle inversion     Ankle eversion      (Blank rows = not tested)  LOWER EXTREMITY SPECIAL TESTS:  Not tolerated  FUNCTIONAL TESTS:  Not tolerated  GAIT: Distance walked: 400 ft Assistive device utilized: None Level of assistance: Complete Independence Comments: antalgic                                                                                                                                TREATMENT  [facility] Adult PT Treatment:                                                DATE: 09/12/24 Pt seen for aquatic therapy today.  Treatment took place in water 3.5-4.75 ft in depth at the [facility] pool. Temp of water was 91.  Pt entered/exited the pool via stairs using step to pattern with hand rail.   Exercises - walking forward/ back and side stepping 4.6 ft - Cycling on noodle/noodle wrapped under shoulders  -Suitcase carry using bil blue hand floats  -lower back stretch with feet in ladder - side stepping with arm add/ abd -> into wide vertical  squat - [place] with step back, UE On blue hand floats - Noodle press - with yellow hand floats    - Standing Shoulder Horizontal Abduction Using Hand buoy    - cycling with noodle under arm behind back *walking backwards/forwards, unsupported between exercises for relief.   Pt requires the buoyancy and hydrostatic pressure of water for support, and to offload joints by unweighting joint load by at least 50 % in navel deep water and by at least 75-80% in chest to neck deep water.  Viscosity of the water is needed for resistance of strengthening. Water current perturbations provides challenge to standing balance requiring increased core activation.       PATIENT EDUCATION:  Education details: reacquainting to aquatic therapy   Person educated: Patient Education method: Explanation Education comprehension: verbalized understanding  HOME EXERCISE PROGRAM:  Aquatic YZE:[id number]  This aquatic home exercise program from [employer] utilizes pictures from land based exercises, but has been adapted prior to lamination and issuance.    ASSESSMENT:  CLINICAL IMPRESSION: Pt required frequent cuing for relaxed shoulder positioning to assist in reduction of pain. He requires multiple rest period throughout to allow for LB decompression. Was unable to get through entire HEP; declined to attempt plank position in water as he reports this may exacerbate his symptoms. ODI  score slightly worse than at intake. Pt verbalized readiness to d/c to HEP.  Pt has partially met his goals.       PN: pt demonstrates improvement with toleration to activity submerged and with functional activities at home.  He continues to be limited by pain but has improved with his pain management skills through exercise in aquatics (using the properties of water) as well as understanding of limitations.  He does gain pain relief while submerged which allows for improved physiological wellbeing with reduction in pain  which remains lower post sessions. His hip strength has improved as noted in above chart. Pt reports increased ease with transfers in/out chairs and bed meeting LTG.  He will continue to benefit from skilled aquatic physical therapy with plan to create and instruct on aquatic final HEP.  He will be gaining pool access here at [employer] in next month.     OBJECTIVE IMPAIRMENTS: decreased activity tolerance, difficulty walking, decreased balance, decreased endurance, decreased mobility, decreased ROM, decreased strength, impaired flexibility, impaired UE/LE use, postural dysfunction, and pain.   ACTIVITY LIMITATIONS: bending, lifting, carry, locomotion, cleaning, community activity, driving, and or occupation   PERSONAL FACTORS: Arthritis, Back pain, DM, Dyspnea, Gout, Multiple joint pain, LE edema, sleep apnea are also affecting patient's functional outcome.   REHAB POTENTIAL: Good   CLINICAL DECISION MAKING: Evolving/moderate complexity   EVALUATION COMPLEXITY: Moderate   GOALS: Goals reviewed with patient? Yes  SHORT TERM GOALS: Target date: 10/10 Pt will tolerate full aquatic sessions consistently without increase in pain and with improving function to demonstrate good toleration and effectiveness of intervention.  Baseline: Goal status: Met 08/03/24  2.  Pt will have a reduction of LB/hip pain by 50% submerged for evidence of water properties effect on chronic pain cycle. Baseline:  Goal status:MET - 07/28/24    LONG TERM GOALS: Target date: 09/16/24  Pt to improve on ODI by 13% to demonstrate statistically significant Improvement in function. (MCID 13-15%) Baseline: ODI 29/50=58% Goal status: NOT MET  - 09/12/24  2.  Pt will improve lumbar ROM to 50% to improve functional mobility Baseline:see chart Goal status: Met as of 08/24/24   3.  Pt will report decrease in pain by at least 50% for improved toleration to activity/quality of life and to demonstrate improved management  of pain. Baseline: see chart Goal status:  Not Met due to chronicity of condition. 08/25/24  4.  Pt will improve strength in hip flex by 1 grade to demonstrate improved overall physical function Baseline: see chart Goal status: Not Met Pt continues to be limited by pain 09/04/24  5.  Pt will report an increase in ability to transfer in and out of bed and chair Baseline: difficulty Goal status: Partially met 09/04/24    PLAN:  PT FREQUENCY: 1-2x/week  PT DURATION: 3 weeks  PLANNED INTERVENTIONS: 97164- PT Re-evaluation, 97750- Physical Performance Testing, 97110-Therapeutic exercises, 97530- Therapeutic activity, 97112- Neuromuscular re-education, 97535- Self Care, 02859- Manual therapy, [id number]- Gait training, [phone]- Aquatic Therapy, [phone] (1-2 muscles), 20561 (3+ muscles)- Dry Needling, Patient/Family education, Balance training, Stair training, Taping, Joint mobilization, DME instructions, Cryotherapy, and Moist heat  Delon Aquas, PTA 09/12/24 1:51 PM [facility] [address] [place], [place], [address] Phone: [phone]   Fax:  [phone]     Referring diagnosis? M25.551 (ICD-10-CM) - Pain in right hip  Treatment diagnosis? (if different than referring diagnosis) other low back pain added  What was this (referring dx) caused by? []   Surgery []  Fall [x]  Ongoing issue [x]  Arthritis []  Other: ____________  Laterality: []  Rt []  Lt [x]  Both  Check all possible CPT codes:  *CHOOSE 10 OR LESS*    See Planned Interventions listed in the Plan section of the Evaluation.   "

## 2024-10-18 ENCOUNTER — Encounter: Payer: Self-pay | Admitting: Podiatry

## 2024-10-18 ENCOUNTER — Ambulatory Visit: Admitting: Podiatry

## 2024-10-18 DIAGNOSIS — M79674 Pain in right toe(s): Secondary | ICD-10-CM | POA: Diagnosis not present

## 2024-10-18 DIAGNOSIS — B351 Tinea unguium: Secondary | ICD-10-CM | POA: Diagnosis not present

## 2024-10-18 DIAGNOSIS — M79675 Pain in left toe(s): Secondary | ICD-10-CM | POA: Diagnosis not present

## 2024-10-18 NOTE — Progress Notes (Signed)
 Subjective:   Patient ID: Luke Larsen, male   DOB: 68 y.o.   MRN: [id number]   HPI Patient presents with significant nail disease 1-5 both feet thickened dystrophic moderately painful and states that he has terrible back problems and cannot take care of himself.  Patient does not smoke   Review of Systems  All other systems reviewed and are negative.       Objective:  Physical Exam Vitals and nursing note reviewed.  Constitutional:      Appearance: He is well-developed.  Pulmonary:     Effort: Pulmonary effort is normal.  Musculoskeletal:        General: Normal range of motion.  Skin:    General: Skin is warm.  Neurological:     Mental Status: He is alert.     Neurovascular status intact muscle strength found to be adequate range of motion within normal limits with patient found to have thick yellow brittle nailbeds 1-5 both feet that get dystrophic and can become painful at times     Assessment:  Chronic mycotic nail infection with pain 1-5 both feet     Plan:  H&P done debride nailbeds 1-5 both feet instructed on routine care and patient will be seen back to recheck

## 2024-10-24 ENCOUNTER — Ambulatory Visit (HOSPITAL_BASED_OUTPATIENT_CLINIC_OR_DEPARTMENT_OTHER): Admitting: Cardiology

## 2025-01-05 ENCOUNTER — Ambulatory Visit (HOSPITAL_BASED_OUTPATIENT_CLINIC_OR_DEPARTMENT_OTHER): Admitting: Cardiology
